# Patient Record
Sex: Male | Born: 1935 | Race: White | Hispanic: No | Marital: Married | Smoking: Never smoker
Health system: Southern US, Community
[De-identification: ages and names within clinical notes are randomized; demographics above are authoritative.]

## PROBLEM LIST (undated history)

## (undated) DIAGNOSIS — I10 Essential (primary) hypertension: Secondary | ICD-10-CM

## (undated) HISTORY — PX: TONSILLECTOMY: SUR1361

## (undated) HISTORY — PX: APPENDECTOMY: SHX54

---

## 1997-11-27 ENCOUNTER — Ambulatory Visit (HOSPITAL_COMMUNITY): Admission: RE | Admit: 1997-11-27 | Discharge: 1997-11-27 | Payer: Self-pay | Admitting: Orthopedic Surgery

## 2003-01-14 ENCOUNTER — Ambulatory Visit (HOSPITAL_COMMUNITY): Admission: RE | Admit: 2003-01-14 | Discharge: 2003-01-14 | Payer: Self-pay | Admitting: Gastroenterology

## 2004-01-22 ENCOUNTER — Emergency Department (HOSPITAL_COMMUNITY): Admission: EM | Admit: 2004-01-22 | Discharge: 2004-01-22 | Payer: Self-pay | Admitting: Emergency Medicine

## 2015-08-14 DIAGNOSIS — L309 Dermatitis, unspecified: Secondary | ICD-10-CM | POA: Diagnosis not present

## 2015-09-15 DIAGNOSIS — H6122 Impacted cerumen, left ear: Secondary | ICD-10-CM | POA: Diagnosis not present

## 2015-09-15 DIAGNOSIS — H6062 Unspecified chronic otitis externa, left ear: Secondary | ICD-10-CM | POA: Diagnosis not present

## 2015-09-16 DIAGNOSIS — H524 Presbyopia: Secondary | ICD-10-CM | POA: Diagnosis not present

## 2015-09-29 DIAGNOSIS — H6062 Unspecified chronic otitis externa, left ear: Secondary | ICD-10-CM | POA: Diagnosis not present

## 2015-09-29 DIAGNOSIS — H6122 Impacted cerumen, left ear: Secondary | ICD-10-CM | POA: Diagnosis not present

## 2015-10-26 DIAGNOSIS — K5900 Constipation, unspecified: Secondary | ICD-10-CM | POA: Diagnosis not present

## 2015-10-26 DIAGNOSIS — I10 Essential (primary) hypertension: Secondary | ICD-10-CM | POA: Diagnosis not present

## 2016-03-18 DIAGNOSIS — K5900 Constipation, unspecified: Secondary | ICD-10-CM | POA: Diagnosis not present

## 2016-03-25 DIAGNOSIS — Z23 Encounter for immunization: Secondary | ICD-10-CM | POA: Diagnosis not present

## 2016-03-30 DIAGNOSIS — H6121 Impacted cerumen, right ear: Secondary | ICD-10-CM | POA: Diagnosis not present

## 2016-03-30 DIAGNOSIS — H6061 Unspecified chronic otitis externa, right ear: Secondary | ICD-10-CM | POA: Diagnosis not present

## 2016-04-06 DIAGNOSIS — H6061 Unspecified chronic otitis externa, right ear: Secondary | ICD-10-CM | POA: Diagnosis not present

## 2016-04-06 DIAGNOSIS — H6121 Impacted cerumen, right ear: Secondary | ICD-10-CM | POA: Diagnosis not present

## 2016-04-21 DIAGNOSIS — K5900 Constipation, unspecified: Secondary | ICD-10-CM | POA: Diagnosis not present

## 2016-04-27 DIAGNOSIS — Z0000 Encounter for general adult medical examination without abnormal findings: Secondary | ICD-10-CM | POA: Diagnosis not present

## 2016-04-27 DIAGNOSIS — I10 Essential (primary) hypertension: Secondary | ICD-10-CM | POA: Diagnosis not present

## 2016-07-01 DIAGNOSIS — Z23 Encounter for immunization: Secondary | ICD-10-CM | POA: Diagnosis not present

## 2016-10-25 DIAGNOSIS — K5900 Constipation, unspecified: Secondary | ICD-10-CM | POA: Diagnosis not present

## 2016-10-25 DIAGNOSIS — I10 Essential (primary) hypertension: Secondary | ICD-10-CM | POA: Diagnosis not present

## 2016-10-28 DIAGNOSIS — H524 Presbyopia: Secondary | ICD-10-CM | POA: Diagnosis not present

## 2016-12-21 DIAGNOSIS — M25561 Pain in right knee: Secondary | ICD-10-CM | POA: Diagnosis not present

## 2017-03-30 DIAGNOSIS — Z23 Encounter for immunization: Secondary | ICD-10-CM | POA: Diagnosis not present

## 2017-04-05 DIAGNOSIS — H6122 Impacted cerumen, left ear: Secondary | ICD-10-CM | POA: Diagnosis not present

## 2017-05-02 DIAGNOSIS — I10 Essential (primary) hypertension: Secondary | ICD-10-CM | POA: Diagnosis not present

## 2017-05-02 DIAGNOSIS — Z0000 Encounter for general adult medical examination without abnormal findings: Secondary | ICD-10-CM | POA: Diagnosis not present

## 2017-07-03 DIAGNOSIS — Z23 Encounter for immunization: Secondary | ICD-10-CM | POA: Diagnosis not present

## 2017-08-30 ENCOUNTER — Observation Stay (HOSPITAL_COMMUNITY)
Admission: EM | Admit: 2017-08-30 | Discharge: 2017-08-31 | Disposition: A | Discharge status: Home / Self Care | Payer: Medicare Other | Attending: Internal Medicine | Admitting: Internal Medicine

## 2017-08-30 ENCOUNTER — Emergency Department (HOSPITAL_COMMUNITY): Payer: Medicare Other

## 2017-08-30 ENCOUNTER — Encounter (HOSPITAL_COMMUNITY): Payer: Self-pay | Admitting: Emergency Medicine

## 2017-08-30 ENCOUNTER — Other Ambulatory Visit: Payer: Self-pay

## 2017-08-30 DIAGNOSIS — D72829 Elevated white blood cell count, unspecified: Secondary | ICD-10-CM | POA: Insufficient documentation

## 2017-08-30 DIAGNOSIS — Z7982 Long term (current) use of aspirin: Secondary | ICD-10-CM | POA: Diagnosis not present

## 2017-08-30 DIAGNOSIS — I10 Essential (primary) hypertension: Secondary | ICD-10-CM | POA: Diagnosis not present

## 2017-08-30 DIAGNOSIS — J9811 Atelectasis: Secondary | ICD-10-CM | POA: Insufficient documentation

## 2017-08-30 DIAGNOSIS — I2510 Atherosclerotic heart disease of native coronary artery without angina pectoris: Secondary | ICD-10-CM | POA: Insufficient documentation

## 2017-08-30 DIAGNOSIS — Z8249 Family history of ischemic heart disease and other diseases of the circulatory system: Secondary | ICD-10-CM | POA: Diagnosis not present

## 2017-08-30 DIAGNOSIS — R079 Chest pain, unspecified: Secondary | ICD-10-CM | POA: Diagnosis not present

## 2017-08-30 DIAGNOSIS — I493 Ventricular premature depolarization: Secondary | ICD-10-CM | POA: Insufficient documentation

## 2017-08-30 DIAGNOSIS — I082 Rheumatic disorders of both aortic and tricuspid valves: Secondary | ICD-10-CM | POA: Diagnosis not present

## 2017-08-30 DIAGNOSIS — R072 Precordial pain: Secondary | ICD-10-CM | POA: Diagnosis not present

## 2017-08-30 DIAGNOSIS — Z79899 Other long term (current) drug therapy: Secondary | ICD-10-CM | POA: Diagnosis not present

## 2017-08-30 DIAGNOSIS — R5383 Other fatigue: Secondary | ICD-10-CM | POA: Diagnosis not present

## 2017-08-30 DIAGNOSIS — R0789 Other chest pain: Secondary | ICD-10-CM | POA: Diagnosis not present

## 2017-08-30 DIAGNOSIS — R9431 Abnormal electrocardiogram [ECG] [EKG]: Secondary | ICD-10-CM | POA: Insufficient documentation

## 2017-08-30 DIAGNOSIS — I700 Atherosclerosis of aorta: Secondary | ICD-10-CM | POA: Insufficient documentation

## 2017-08-30 HISTORY — DX: Essential (primary) hypertension: I10

## 2017-08-30 LAB — URINALYSIS, ROUTINE W REFLEX MICROSCOPIC
BILIRUBIN URINE: NEGATIVE
Glucose, UA: NEGATIVE mg/dL
Hgb urine dipstick: NEGATIVE
KETONES UR: 5 mg/dL — AB
Leukocytes, UA: NEGATIVE
NITRITE: NEGATIVE
PH: 7 (ref 5.0–8.0)
Protein, ur: NEGATIVE mg/dL
SPECIFIC GRAVITY, URINE: 1.01 (ref 1.005–1.030)

## 2017-08-30 LAB — TROPONIN I

## 2017-08-30 LAB — CBC
HEMATOCRIT: 42.2 % (ref 39.0–52.0)
HEMOGLOBIN: 14.3 g/dL (ref 13.0–17.0)
MCH: 32.2 pg (ref 26.0–34.0)
MCHC: 33.9 g/dL (ref 30.0–36.0)
MCV: 95 fL (ref 78.0–100.0)
Platelets: 226 10*3/uL (ref 150–400)
RBC: 4.44 MIL/uL (ref 4.22–5.81)
RDW: 12.9 % (ref 11.5–15.5)
WBC: 15.6 10*3/uL — AB (ref 4.0–10.5)

## 2017-08-30 LAB — BASIC METABOLIC PANEL
ANION GAP: 11 (ref 5–15)
BUN: 21 mg/dL — ABNORMAL HIGH (ref 6–20)
CO2: 23 mmol/L (ref 22–32)
Calcium: 9.2 mg/dL (ref 8.9–10.3)
Chloride: 103 mmol/L (ref 101–111)
Creatinine, Ser: 0.99 mg/dL (ref 0.61–1.24)
GLUCOSE: 114 mg/dL — AB (ref 65–99)
POTASSIUM: 4.4 mmol/L (ref 3.5–5.1)
SODIUM: 137 mmol/L (ref 135–145)

## 2017-08-30 LAB — I-STAT TROPONIN, ED
TROPONIN I, POC: 0.01 ng/mL (ref 0.00–0.08)
Troponin i, poc: 0 ng/mL (ref 0.00–0.08)

## 2017-08-30 MED ORDER — SODIUM CHLORIDE 0.9 % IV BOLUS
1000.0000 mL | Freq: Once | INTRAVENOUS | Status: AC
  Administered 2017-08-30: 1000 mL via INTRAVENOUS

## 2017-08-30 MED ORDER — ASPIRIN EC 81 MG PO TBEC
81.0000 mg | DELAYED_RELEASE_TABLET | Freq: Every day | ORAL | Status: DC
  Administered 2017-08-31: 81 mg via ORAL
  Filled 2017-08-30: qty 1

## 2017-08-30 MED ORDER — ENOXAPARIN SODIUM 40 MG/0.4ML ~~LOC~~ SOLN
40.0000 mg | Freq: Every day | SUBCUTANEOUS | Status: DC
  Administered 2017-08-30: 40 mg via SUBCUTANEOUS
  Filled 2017-08-30: qty 0.4

## 2017-08-30 MED ORDER — ACETAMINOPHEN 325 MG PO TABS: 650.0000 mg | ORAL_TABLET | ORAL | Status: DC | PRN

## 2017-08-30 MED ORDER — MORPHINE SULFATE (PF) 2 MG/ML IV SOLN: 2.0000 mg | INTRAVENOUS | Status: DC | PRN

## 2017-08-30 MED ORDER — IOPAMIDOL (ISOVUE-370) INJECTION 76%
INTRAVENOUS | Status: AC
  Administered 2017-08-30: 100 mL
  Filled 2017-08-30: qty 100

## 2017-08-30 MED ORDER — MORPHINE SULFATE (PF) 4 MG/ML IV SOLN: 2.0000 mg | INTRAVENOUS | Status: DC | PRN

## 2017-08-30 MED ORDER — ONDANSETRON HCL 4 MG/2ML IJ SOLN: 4.0000 mg | Freq: Four times a day (QID) | INTRAMUSCULAR | Status: DC | PRN

## 2017-08-30 MED ORDER — SODIUM CHLORIDE 0.9 % IV SOLN
100.0000 mg | Freq: Once | INTRAVENOUS | Status: AC
  Administered 2017-08-30: 100 mg via INTRAVENOUS
  Filled 2017-08-30: qty 100

## 2017-08-30 NOTE — ED Provider Notes (Signed)
[facility]-EMERGENCY DEPT Provider Note   CSN: [id number] Arrival date & time: 08/30/17  1432     History   Chief Complaint Chief Complaint  Patient presents with  . Chest Pain    HPI COHL BEHRENS is a 84 y.o. male with a past medical history of hypertension, who presents to ED for evaluation of 9-hour history of sudden onset mid chest "fullness, funny feeling."  Symptoms began after he woke up this morning.  He denies any pain but states that the feeling is worse with deep breathing.  He also feels fatigued "all over."  He is unsure if this is caused by him doing some yard work outside for the past 2 days.  No previous history of similar symptoms in the past.  Denies any shortness of breath, cough, hemoptysis, recent surgeries, recent prolonged travel, leg swelling, history of MI, DVT or PE, fever, sick contacts with similar symptoms, vomiting, abdominal pain, recent hospitalizations.  HPI  Past Medical History:  Diagnosis Date  . Hypertension     There are no active problems to display for this patient.   History reviewed. No pertinent surgical history.      Home Medications    Prior to Admission medications   Medication Sig Start Date End Date Taking? Authorizing Provider  amLODipine (NORVASC) 10 MG tablet Take 10 mg by mouth daily. 03/05/14  Yes [provider]  aspirin 81 MG EC tablet Take 81 mg by mouth daily. Swallow whole.   Yes [provider]    Family History No family history on file.  Social History Social History   Tobacco Use  . Smoking status: Not on file  Substance Use Topics  . Alcohol use: Not on file  . Drug use: Not on file     Allergies   Patient has no known allergies.   Review of Systems Review of Systems  Constitutional: Positive for fatigue. Negative for appetite change, chills and fever.  HENT: Negative for ear pain, rhinorrhea, sneezing and sore throat.   Eyes: Negative for photophobia  and visual disturbance.  Respiratory: Negative for cough, chest tightness, shortness of breath and wheezing.   Cardiovascular: Positive for chest pain. Negative for palpitations.  Gastrointestinal: Negative for abdominal pain, blood in stool, constipation, diarrhea, nausea and vomiting.  Genitourinary: Negative for dysuria, hematuria and urgency.  Musculoskeletal: Negative for myalgias.  Skin: Negative for rash.  Neurological: Negative for dizziness, weakness and light-headedness.     Physical Exam Updated Vital Signs BP (!) 128/59 (BP Location: Right Arm)   Pulse 68   Temp 98.7 F (37.1 C) (Oral)   Resp 15   Ht 5\' 10"  (1.778 m)   Wt 70.3 kg (155 lb)   SpO2 95%   BMI 22.24 kg/m   Physical Exam  Constitutional: He appears well-developed and well-nourished. No distress.  Nontoxic appearing and in no acute distress.   HENT:  Head: Normocephalic and atraumatic.  Nose: Nose normal.  Eyes: Conjunctivae and EOM are normal. Right eye exhibits no discharge. Left eye exhibits no discharge. No scleral icterus.  Neck: Normal range of motion. Neck supple.  Cardiovascular: Normal rate, regular rhythm, normal heart sounds and intact distal pulses. Exam reveals no gallop and no friction rub.  No murmur heard. Pulmonary/Chest: Effort normal and breath sounds normal. No respiratory distress.  No chest tenderness to palpation.  Abdominal: Soft. Bowel sounds are normal. He exhibits no distension. There is no tenderness. There is no guarding.  Musculoskeletal:  Normal range of motion. He exhibits no edema.  No lower extremity edema, erythema or calf tenderness bilaterally.  Neurological: He is alert. He exhibits normal muscle tone. Coordination normal.  Skin: Skin is warm and dry. No rash noted.  Psychiatric: He has a normal mood and affect.  Nursing note and vitals reviewed.    ED Treatments / Results  Labs (all labs ordered are listed, but only abnormal results are displayed) Labs  Reviewed  BASIC METABOLIC PANEL - Abnormal; Notable for the following components:      Result Value   Glucose, Bld 114 (*)    BUN 21 (*)    All other components within normal limits  CBC - Abnormal; Notable for the following components:   WBC 15.6 (*)    All other components within normal limits  I-STAT TROPONIN, ED  I-STAT TROPONIN, ED    EKG EKG Interpretation  Date/Time:  Wednesday August 30 2017 14:50:33 EDT Ventricular Rate:  83 PR Interval:    QRS Duration: 82 QT Interval:  366 QTC Calculation: 430 R Axis:   20 Text Interpretation:  Sinus rhythm no change over time.  Confirmed by Bary Castilla ([id number]) on 08/30/2017 2:54:40 PM   Radiology Dg Chest 2 View  Result Date: 08/30/2017 CLINICAL DATA:  Mid chest full sensation began this morning around 615 associated with bilateral arm fatigue. History of hypertension. Nonsmoker. EXAM: CHEST - 2 VIEW COMPARISON:  None in PACs FINDINGS: The lungs are adequately inflated. There are coarse lung markings at the left lung base and to a lesser extent on the right. The heart and pulmonary vascularity are normal. The mediastinum is normal in width. There is calcification in the wall of the aortic arch. The bony thorax exhibits no acute abnormality. IMPRESSION: Bibasilar atelectasis or pneumonia greatest on the left. Followup PA and lateral chest X-ray is recommended in 3-4 weeks following trial of antibiotic therapy to ensure resolution and exclude underlying malignancy. Electronically Signed   By: David  [place] M.D.   On: 08/30/2017 15:19    Procedures Procedures (including critical care time)  Medications Ordered in ED Medications  sodium chloride 0.9 % bolus 1,000 mL (0 mLs Intravenous Stopped 08/30/17 1956)  doxycycline (VIBRAMYCIN) 100 mg in sodium chloride 0.9 % 250 mL IVPB (0 mg Intravenous Stopped 08/30/17 1956)     Initial Impression / Assessment and Plan / ED Course  I have reviewed the triage vital signs and the nursing  notes.  Pertinent labs & imaging results that were available during my care of the patient were reviewed by me and considered in my medical decision making (see chart for details).  Clinical Course as of Aug 31 2027  Wed Aug 30, 2017  327 84 year old male here with some vague chest and upper abdominal discomfort since waking up this morning.  He denies that it the pain.  States is been working on the yard hard for the last 2 days.  No prior history of coronary disease.  It is associated with some generalized fatigue.  His initial EKG was quite abnormal but on repeat appeared to be less concerning.  His initial troponin is negative.  Chest x-ray was read as atelectasis versus pneumonia.  He denies any cough.  Waiting delta trop.    [MB]    Clinical Course User Index [MB] Terrilee Files, MD    Patient presents to ED for evaluation of 9-hour history of sudden onset mid chest "fullness" that began after he woke up this morning.  No previous history of similar symptoms in the past.  He does have a history of hypertension and reports compliance with his home medications.  He denies history of diabetes, family history of CAD, tobacco use, recent surgeries, recent prolonged travel, history of MI, DVT or PE.  States that the sensation has been constant.  He is afebrile with no use of antipyretics recently and no history of fever.  He was satting at 93-35% on room air with clear breath sounds bilaterally.  He has no lower extremity edema, erythema or calf tenderness.  Troponin negative x2.  CBC with leukocytosis at 15.  BMP unremarkable.  EKG initially showed abnormal changes but repeat appears to be less concerning, per Dr. Charm BargesButler.  Chest x-ray with possible pneumonia.  Patient was given empiric antibiotics.  His saturations dropped to 92% on room air while ambulating.  Patient will need to be admitted for chest pain rule out.  He has a HEART score of 4 which makes him moderate risk.  Spoke to hospitalist  who requests that we order CT angio to evaluate for PE or confirmation of pneumonia.  Appreciate the help of hospitalist for management of this patient. Patient discussed with and seen by my attending physician, Dr. Charm BargesButler.  Portions of this note were generated with Scientist, clinical (histocompatibility and immunogenetics)Dragon dictation software. Dictation errors may occur despite best attempts at proofreading.   Final Clinical Impressions(s) / ED Diagnoses   Final diagnoses:  None    ED Discharge Orders    None       Dietrich PatesKhatri, Tovah Slavick, PA-C 08/30/17 2032    Terrilee FilesButler, Michael C, MD 08/31/17 1153

## 2017-08-30 NOTE — ED Notes (Signed)
Patient transported to CT 

## 2017-08-30 NOTE — H&P (Signed)
Don Howard ZOX:[id number] DOB: [birth year] DOA: 08/30/2017     PCP: [name]   Outpatient Specialists:  NONE   Patient arrived to ER on 08/30/17 at 1432  Patient coming from:  home Lives alone,     Chief Complaint:  Chief Complaint  Patient presents with  . Chest Pain    HPI: Don Howard is a 84 y.o. male with medical history significant of HTN    Presented with chest discomfort of fullness started when he got up this morning at 615 he had described a sensation of fatigue in both arms no otherwise localized neurological findings. Reports no prior hx of the same. His chest discomfort for now has been going on for the past 9 hours he describes it as a funny feeling.  Does not quite feel it is a painful sensation somewhat worse with deep breaths.  Reports being tired all over no fevers or chills he has been doing some yard work for the past 2 days but unsure if this is related.  He had no recent travel no leg edema no known history of coronary artery disease or cancer no prior history of DVTs or PEs.  Denies any sick contacts.  Otherwise no nausea vomiting or diarrhea He does yard work without any chest pains.   Regarding pertinent Chronic problems: History of hypertension for which she takes Norvasc   While in ER:  Was found to have elevated white blood cell count chest x-ray unclear if possible pneumonia but also abnormal EKG initially on repeat was improved initial troponin x2 is been negative he was started on antibiotics  Significant initial  Findings: Abnormal Labs Reviewed  BASIC METABOLIC PANEL - Abnormal; Notable for the following components:      Result Value   Glucose, Bld 114 (*)    BUN 21 (*)    All other components within normal limits  CBC - Abnormal; Notable for the following components:   WBC 15.6 (*)    All other components within normal limits      Na 137 K 4 .4  Cr  Lab Results  Component Value Date   CREATININE  0.99 08/30/2017      HG/HCT     Component Value Date/Time   HGB 14.3 08/30/2017 1502   HCT 42.2 08/30/2017 1502   Troponin (Point of Care Test) Recent Labs    08/30/17 1859  TROPIPOC 0.00      UA   ordered    CXR -bibasilar atelectasis and pneumonia possibly worse in the left  CTA chest -no PE and no PNA    ECG:  Personally reviewed by me showing: HR : 83 Rhythm:  NSR,   Ischemic changes nonspecific changes,  QTC 430  7 EKG shows frequent PVCs      ED Triage Vitals  Enc Vitals Group     BP 08/30/17 1446 (!) 148/71     Pulse Rate 08/30/17 1446 79     Resp 08/30/17 1446 (!) 21     Temp 08/30/17 1446 98.7 F (37.1 C)     Temp Source 08/30/17 1446 Oral     SpO2 08/30/17 1446 95 %     Weight 08/30/17 1447 155 lb (70.3 kg)     Height 08/30/17 1447 5\' 10"  (1.778 m)     Head Circumference --      Peak Flow --      Pain Score 08/30/17 1451 4  Pain Loc --      Pain Edu? --      Excl. in GC? --   TMAX(24)@     on arrival  ED Triage Vitals  Enc Vitals Group     BP 08/30/17 1446 (!) 148/71     Pulse Rate 08/30/17 1446 79     Resp 08/30/17 1446 (!) 21     Temp 08/30/17 1446 98.7 F (37.1 C)     Temp Source 08/30/17 1446 Oral     SpO2 08/30/17 1446 95 %     Weight 08/30/17 1447 155 lb (70.3 kg)     Height 08/30/17 1447 5\' 10"  (1.778 m)     Head Circumference --      Peak Flow --      Pain Score 08/30/17 1451 4     Pain Loc --      Pain Edu? --      Excl. in GC? --      Latest  Blood pressure (!) 128/59, pulse 68, temperature 98.7 F (37.1 C), temperature source Oral, resp. rate 15, height 5\' 10"  (1.778 m), weight 70.3 kg (155 lb), SpO2 95 %.   Following Medications were ordered in ER: Medications  sodium chloride 0.9 % bolus 1,000 mL (0 mLs Intravenous Stopped 08/30/17 1956)  doxycycline (VIBRAMYCIN) 100 mg in sodium chloride 0.9 % 250 mL IVPB (0 mg Intravenous Stopped 08/30/17 1956)     Hospitalist was called for admission for possible pneumonia  versus chest pain evaluation   Review of Systems:    Pertinent positives include: fatigue,  chest discomfort  Constitutional:  No weight loss, night sweats, Fevers, chills, weight loss  HEENT:  No headaches, Difficulty swallowing,Tooth/dental problems,Sore throat,  No sneezing, itching, ear ache, nasal congestion, post nasal drip,  Cardio-vascular:  NoOrthopnea, PND, anasarca, dizziness, palpitations.no Bilateral lower extremity swelling  GI:  No heartburn, indigestion, abdominal pain, nausea, vomiting, diarrhea, change in bowel habits, loss of appetite, melena, blood in stool, hematemesis Resp:  no shortness of breath at rest. No dyspnea on exertion, No excess mucus, no productive cough, No non-productive cough, No coughing up of blood.No change in color of mucus.No wheezing. Skin:  no rash or lesions. No jaundice GU:  no dysuria, change in color of urine, no urgency or frequency. No straining to urinate.  No flank pain.  Musculoskeletal:  No joint pain or no joint swelling. No decreased range of motion. No back pain.  Psych:  No change in mood or affect. No depression or anxiety. No memory loss.  Neuro: no localizing neurological complaints, no tingling, no weakness, no double vision, no gait abnormality, no slurred speech, no confusion  As per HPI otherwise 10 point review of systems negative.   Past Medical History:   Past Medical History:  Diagnosis Date  . Hypertension       History reviewed. No pertinent surgical history.  Social History:  Ambulatory    independently      has no tobacco, alcohol, and drug history on file.     Family History:   Family History  Problem Relation Age of Onset  . Pancreatic cancer Mother   . Hypertension Father   . CAD Neg Hx   . Diabetes Neg Hx     Allergies: No Known Allergies   Prior to Admission medications   Medication Sig Start Date End Date Taking? Authorizing Provider  amLODipine (NORVASC) 10 MG tablet Take  10 mg by mouth daily. 03/05/14  Yes [provider]  aspirin 81 MG EC tablet Take 81 mg by mouth daily. Swallow whole.   Yes [provider]   Physical Exam: Blood pressure (!) 128/59, pulse 68, temperature 98.7 F (37.1 C), temperature source Oral, resp. rate 15, height 5\' 10"  (1.778 m), weight 70.3 kg (155 lb), SpO2 95 %. 1. General:  in No Acute distress  well  -appearing 2. Psychological: Alert and Oriented 3. Head/ENT:    Dry Mucous Membranes                          Head Non traumatic, neck supple                            Poor Dentition 4. SKIN:   decreased Skin turgor,  Skin clean Dry and intact no rash 5. Heart: Regular rate and rhythm no Murmur, no Rub  noted gallop 6. Lungs:  Clear to auscultation bilaterally, no wheezes or crackles   7. Abdomen: Soft,  non-tender, Non distended bowel sounds present 8. Lower extremities: no clubbing, cyanosis, or edema 9. Neurologically Grossly intact, moving all 4 extremities equally   10. MSK: Normal range of motion   LABS:     Recent Labs  Lab 08/30/17 1502  WBC 15.6*  HGB 14.3  HCT 42.2  MCV 95.0  PLT 226   Basic Metabolic Panel: Recent Labs  Lab 08/30/17 1502  NA 137  K 4.4  CL 103  CO2 23  GLUCOSE 114*  BUN 21*  CREATININE 0.99  CALCIUM 9.2      No results for input(s): AST, ALT, ALKPHOS, BILITOT, PROT, ALBUMIN in the last 168 hours. No results for input(s): LIPASE, AMYLASE in the last 168 hours. No results for input(s): AMMONIA in the last 168 hours.    HbA1C: No results for input(s): HGBA1C in the last 72 hours. CBG: No results for input(s): GLUCAP in the last 168 hours.    Urine analysis: No results found for: COLORURINE, APPEARANCEUR, LABSPEC, PHURINE, GLUCOSEU, HGBUR, BILIRUBINUR, KETONESUR, PROTEINUR, UROBILINOGEN, NITRITE, LEUKOCYTESUR     Cultures: No results found for: SDES, SPECREQUEST, CULT, REPTSTATUS   Radiological Exams on Admission: Dg Chest 2 View  Result Date:  08/30/2017 CLINICAL DATA:  Mid chest full sensation began this morning around 615 associated with bilateral arm fatigue. History of hypertension. Nonsmoker. EXAM: CHEST - 2 VIEW COMPARISON:  None in PACs FINDINGS: The lungs are adequately inflated. There are coarse lung markings at the left lung base and to a lesser extent on the right. The heart and pulmonary vascularity are normal. The mediastinum is normal in width. There is calcification in the wall of the aortic arch. The bony thorax exhibits no acute abnormality. IMPRESSION: Bibasilar atelectasis or pneumonia greatest on the left. Followup PA and lateral chest X-ray is recommended in 3-4 weeks following trial of antibiotic therapy to ensure resolution and exclude underlying malignancy. Electronically Signed   By: David  [place] M.D.   On: 08/30/2017 15:19    Chart has been reviewed   Assessment/Plan  84 y.o. male with medical history significant of HTN  Admitted for Chest pain  Present on Admission: . Chest pain - - H=   1  , E= 1 ,A= 2   , R  1  , T 0  ,  for the  Total of 5 therefore will admit for observation and further evaluation ( Risk of MACE: Scores  0-3  of 0.9-1.7%.,  4-6: 12-16.6% , Scores ?7: 50-65% )   -  Other explanation for chest pain could be  musculoskeletal   - monitor on telemetry, cycle cardiac enzymes, obtain serial ECG and  ECHO in AM.   - Daily aspirin -  Further risk stratify with lipid panel, hgA1C, obtain TSH.  Make sure patient is on Aspirin.  We will notify cardiology regarding patient's admission. Further management depends on pending  workup  . Abnormal EKG - order echo monitor on telemetry Leukocytosis unclear etiology no evidence of pneumonia confirmed by CT Will obtain urine to evaluate for evidence of UTI  Other plan as per orders.  DVT prophylaxis:    Lovenox     Code Status:  FULL CODE   as per patient    Family Communication:   Family at  Bedside  plan of care was discussed with GrandSon,    Disposition Plan:      To home once workup is complete and patient is stable                            Consults called: email cardiology   Admission status:   obs   Level of care    tele           I have spent a total of 56 min on this admission   Felicia Bloomquist 08/30/2017, 9:30 PM     [facility]  Pager [phone]   after 2 AM please page floor coverage PA If 7AM-7PM, please contact the day team taking care of the patient  [email or web address]  Password [email or web address]

## 2017-08-30 NOTE — ED Notes (Signed)
ED TO INPATIENT HANDOFF REPORT  Name/Age/Gender Don Howard 84 y.o. male  Code Status    Code Status Orders  (From admission, onward)        Start     Ordered   08/30/17 2223  Full code  Continuous     08/30/17 2222    Code Status History    This patient has a current code status but no historical code status.    Advance Directive Documentation     Most Recent Value  Type of Advance Directive  Healthcare Power of Attorney, Living will  Pre-existing out of facility DNR order (yellow form or pink MOST form)  -  "MOST" Form in Place?  -      Home/SNF/Other Home  Chief Complaint generalized weakness  Level of Care/Admitting Diagnosis ED Disposition    ED Disposition Condition [place]: [place] [100102]  Level of Care: Telemetry [5]  Admit to tele based on following criteria: Monitor for Ischemic changes  Diagnosis: Chest pain [287867]  Admitting Physician: Toy Baker [[id number]]  Attending Physician: Toy Baker [[id number]]  PT Class (Do Not Modify): Observation [104]  PT Acc Code (Do Not Modify): Observation [10022]       Medical History Past Medical History:  Diagnosis Date  . Hypertension     Allergies No Known Allergies  IV Location/Drains/Wounds Patient Lines/Drains/Airways Status   Active Line/Drains/Airways    Name:   Placement date:   Placement time:   Site:   Days:   Peripheral IV 08/30/17 Left Antecubital   08/30/17    1558    Antecubital   less than 1          Labs/Imaging Results for orders placed or performed during the hospital encounter of 08/30/17 (from the past 48 hour(s))  Basic metabolic panel     Status: Abnormal   Collection Time: 08/30/17  3:02 PM  Result Value Ref Range   Sodium 137 135 - 145 mmol/L   Potassium 4.4 3.5 - 5.1 mmol/L   Chloride 103 101 - 111 mmol/L   CO2 23 22 - 32 mmol/L   Glucose, Bld 114 (H) 65 - 99 mg/dL   BUN 21 (H) 6 - 20 mg/dL   Creatinine,  Ser 0.99 0.61 - 1.24 mg/dL   Calcium 9.2 8.9 - 10.3 mg/dL   GFR calc non Af Amer >60 >60 mL/min   GFR calc Af Amer >60 >60 mL/min    Comment: (NOTE) The eGFR has been calculated using the CKD EPI equation. This calculation has not been validated in all clinical situations. eGFR's persistently <60 mL/min signify possible Chronic Kidney Disease.    Anion gap 11 5 - 15    Comment: Performed at [facility], [place] [address], [place], [place] [address]  CBC     Status: Abnormal   Collection Time: 08/30/17  3:02 PM  Result Value Ref Range   WBC 15.6 (H) 4.0 - 10.5 K/uL   RBC 4.44 4.22 - 5.81 MIL/uL   Hemoglobin 14.3 13.0 - 17.0 g/dL   HCT 42.2 39.0 - 52.0 %   MCV 95.0 78.0 - 100.0 fL   MCH 32.2 26.0 - 34.0 pg   MCHC 33.9 30.0 - 36.0 g/dL   RDW 12.9 11.5 - 15.5 %   Platelets 226 150 - 400 K/uL    Comment: Performed at [facility], [place] [address] [place], [place] [address]  I-stat troponin, ED  Status: None   Collection Time: 08/30/17  3:05 PM  Result Value Ref Range   Troponin i, poc 0.01 0.00 - 0.08 ng/mL   Comment 3            Comment: Due to the release kinetics of cTnI, a negative result within the first hours of the onset of symptoms does not rule out myocardial infarction with certainty. If myocardial infarction is still suspected, repeat the test at appropriate intervals.   I-stat troponin, ED     Status: None   Collection Time: 08/30/17  6:59 PM  Result Value Ref Range   Troponin i, poc 0.00 0.00 - 0.08 ng/mL   Comment 3            Comment: Due to the release kinetics of cTnI, a negative result within the first hours of the onset of symptoms does not rule out myocardial infarction with certainty. If myocardial infarction is still suspected, repeat the test at appropriate intervals.   Urinalysis, Routine w reflex microscopic     Status: Abnormal   Collection Time: 08/30/17  9:14 PM  Result Value Ref Range   Color, Urine  STRAW (A) YELLOW   APPearance CLEAR CLEAR   Specific Gravity, Urine 1.010 1.005 - 1.030   pH 7.0 5.0 - 8.0   Glucose, UA NEGATIVE NEGATIVE mg/dL   Hgb urine dipstick NEGATIVE NEGATIVE   Bilirubin Urine NEGATIVE NEGATIVE   Ketones, ur 5 (A) NEGATIVE mg/dL   Protein, ur NEGATIVE NEGATIVE mg/dL   Nitrite NEGATIVE NEGATIVE   Leukocytes, UA NEGATIVE NEGATIVE    Comment: Performed at [facility] [address] [place], [place] [address]  Troponin I (q 6hr x 3)     Status: None   Collection Time: 08/30/17  9:23 PM  Result Value Ref Range   Troponin I <0.03 <0.03 ng/mL    Comment: Performed at [facility], [place] [address] [place], [place] [address]   Dg Chest 2 View  Result Date: 08/30/2017 CLINICAL DATA:  Mid chest full sensation began this morning around 615 associated with bilateral arm fatigue. History of hypertension. Nonsmoker. EXAM: CHEST - 2 VIEW COMPARISON:  None in PACs FINDINGS: The lungs are adequately inflated. There are coarse lung markings at the left lung base and to a lesser extent on the right. The heart and pulmonary vascularity are normal. The mediastinum is normal in width. There is calcification in the wall of the aortic arch. The bony thorax exhibits no acute abnormality. IMPRESSION: Bibasilar atelectasis or pneumonia greatest on the left. Followup PA and lateral chest X-ray is recommended in 3-4 weeks following trial of antibiotic therapy to ensure resolution and exclude underlying malignancy. Electronically Signed   By: David  Martinique M.D.   On: 08/30/2017 15:19   Ct Angio Chest Pe W/cm &/or Wo Cm  Result Date: 08/30/2017 CLINICAL DATA:  84 year old male with chest pain and fatigue today. EXAM: CT ANGIOGRAPHY CHEST WITH CONTRAST TECHNIQUE: Multidetector CT imaging of the chest was performed using the standard protocol during bolus administration of intravenous contrast. Multiplanar CT image reconstructions and MIPs were obtained to  evaluate the vascular anatomy. CONTRAST:  131m ISOVUE-370 IOPAMIDOL (ISOVUE-370) INJECTION 76% COMPARISON:  Chest radiographs 1509 hours today. FINDINGS: Cardiovascular: Good contrast bolus timing in the pulmonary arterial tree. No focal filling defect identified in the pulmonary arteries to suggest acute pulmonary embolism. Calcified coronary artery atherosclerosis. Mild Calcified aortic atherosclerosis. Mild cardiomegaly. No pericardial effusion. Mediastinum/Nodes: Negative.  No lymphadenopathy.  Lungs/Pleura: The major airways are patent. There is confluent bilateral lower lobe atelectasis along the surface of the diaphragm and in the costophrenic angles. No definite pleural effusion. No infectious consolidation or other abnormal pulmonary opacity. Upper Abdomen: Negative visible liver, spleen, pancreas, adrenal glands, kidneys, and bowel in the upper abdomen. Musculoskeletal: No acute osseous abnormality identified. Review of the MIP images confirms the above findings. IMPRESSION: 1.  Negative for acute pulmonary embolus. 2. Bilateral lung base atelectasis but no other pulmonary abnormality. 3. Calcified coronary artery and Aortic Atherosclerosis (ICD10-I70.0) with mild cardiomegaly. Electronically Signed   By: Genevie Ann M.D.   On: 08/30/2017 20:53    Pending Labs Unresulted Labs (From admission, onward)   Start     Ordered   08/31/17 0500  Lipid panel  Tomorrow morning,   R     08/30/17 2222   08/31/17 0500  TSH  Tomorrow morning,   R     08/30/17 2222   08/31/17 0500  Hemoglobin A1c  Tomorrow morning,   R     08/30/17 2222   08/30/17 2034  Troponin I (q 6hr x 3)  Now then every 6 hours,   R     08/30/17 2033      Vitals/Pain Today's Vitals   08/30/17 1930 08/30/17 1956 08/30/17 2212 08/30/17 2301  BP:  (!) 128/59 136/66 (!) 131/93  Pulse: 69 68 75 71  Resp: 16 15 18 16   Temp:      TempSrc:      SpO2: 96% 95% 97% 95%  Weight:      Height:      PainSc:        Isolation  Precautions No active isolations  Medications Medications  aspirin EC tablet 81 mg (has no administration in time range)  enoxaparin (LOVENOX) injection 40 mg (40 mg Subcutaneous Given 08/30/17 2259)  morphine 2 MG/ML injection 2 mg (has no administration in time range)  acetaminophen (TYLENOL) tablet 650 mg (has no administration in time range)  ondansetron (ZOFRAN) injection 4 mg (has no administration in time range)  sodium chloride 0.9 % bolus 1,000 mL (0 mLs Intravenous Stopped 08/30/17 1956)  doxycycline (VIBRAMYCIN) 100 mg in sodium chloride 0.9 % 250 mL IVPB (0 mg Intravenous Stopped 08/30/17 1956)  iopamidol (ISOVUE-370) 76 % injection (100 mLs  Contrast Given 08/30/17 2037)    Mobility walks

## 2017-08-30 NOTE — ED Notes (Signed)
Pt SP02 didn't drop below 92% while ambulating. Started at 95% and stayed throughout until last 3 feet out of the 4525ft we walked. Pt was a little stumbly, and states "he just doesn't feel right"

## 2017-08-30 NOTE — ED Triage Notes (Signed)
Pt verbalizes when he stood up this morning around 0615 noted fullness in mid chest. Pt verbalizes ongoing since with fatigue in bilateral arms. Hand grips equal and strong. No unilateral weakness in any extremities.

## 2017-08-31 ENCOUNTER — Other Ambulatory Visit: Payer: Self-pay

## 2017-08-31 ENCOUNTER — Observation Stay (HOSPITAL_BASED_OUTPATIENT_CLINIC_OR_DEPARTMENT_OTHER): Payer: Medicare Other

## 2017-08-31 ENCOUNTER — Other Ambulatory Visit (HOSPITAL_COMMUNITY): Payer: Medicare Other

## 2017-08-31 DIAGNOSIS — I10 Essential (primary) hypertension: Secondary | ICD-10-CM | POA: Diagnosis not present

## 2017-08-31 DIAGNOSIS — I361 Nonrheumatic tricuspid (valve) insufficiency: Secondary | ICD-10-CM | POA: Diagnosis not present

## 2017-08-31 DIAGNOSIS — R079 Chest pain, unspecified: Secondary | ICD-10-CM

## 2017-08-31 DIAGNOSIS — R072 Precordial pain: Secondary | ICD-10-CM | POA: Diagnosis not present

## 2017-08-31 DIAGNOSIS — R9431 Abnormal electrocardiogram [ECG] [EKG]: Secondary | ICD-10-CM | POA: Diagnosis not present

## 2017-08-31 DIAGNOSIS — D72829 Elevated white blood cell count, unspecified: Secondary | ICD-10-CM | POA: Diagnosis not present

## 2017-08-31 LAB — ECHOCARDIOGRAM COMPLETE
Height: 70 in
Weight: 2480 oz

## 2017-08-31 LAB — LIPID PANEL
CHOL/HDL RATIO: 2 ratio
CHOLESTEROL: 110 mg/dL (ref 0–200)
HDL: 55 mg/dL (ref >40)
LDL Cholesterol: 49 mg/dL (ref 0–99)
Triglycerides: 29 mg/dL (ref <150)
VLDL: 6 mg/dL (ref 0–40)

## 2017-08-31 LAB — HEMOGLOBIN A1C
Hgb A1c MFr Bld: 5.1 % (ref 4.8–5.6)
Mean Plasma Glucose: 99.67 mg/dL

## 2017-08-31 LAB — TSH: TSH: 2.495 u[IU]/mL (ref 0.350–4.500)

## 2017-08-31 LAB — TROPONIN I
Troponin I: <0.03 ng/mL (ref <0.03)
Troponin I: <0.03 ng/mL (ref <0.03)

## 2017-08-31 MED ORDER — KETOROLAC TROMETHAMINE 15 MG/ML IJ SOLN
15.0000 mg | Freq: Four times a day (QID) | INTRAMUSCULAR | Status: DC | PRN
  Administered 2017-08-31: 15 mg via INTRAVENOUS
  Filled 2017-08-31: qty 1

## 2017-08-31 MED ORDER — MORPHINE SULFATE (PF) 4 MG/ML IV SOLN: 2.0000 mg | INTRAVENOUS | Status: DC | PRN

## 2017-08-31 MED ORDER — KETOROLAC TROMETHAMINE 10 MG PO TABS: 10.0000 mg | ORAL_TABLET | Freq: Four times a day (QID) | ORAL | 0 refills | Status: DC | PRN

## 2017-08-31 NOTE — Consult Note (Signed)
CARDIOLOGY CONSULT NOTE       Patient ID: Don Howard MRN: [id number] DOB/AGE: Apr 28, 1927 84 y.o.  Admit date: 08/30/2017 Referring Physician: Rhona Leavens Primary Physician: [name] Primary Cardiologist: New/Jaymen Fetch Reason for Consultation: Chest Pain  Active Problems:   Chest pain   Abnormal EKG   Leukocytosis   HPI:  84 y.o. failry active for age. History of HTN. No history of vascular disease or CAD. Awoke yesterday with vague feeling in chest Not pain not pressure Just a funny feeling has not had this before He was doing yard work the last 2-3 days raking leaves and cleaning gutters. This am still has pain Thinks its positional better laying flat worse rolling on left side. Not pleuritic. In ER Troponin negative. ECG normal except for PVCls CTA negative for PE. Compliant with BP meds. No recent falls or trauma No recent URI, fever cough or phlegm   ROS All other systems reviewed and negative except as noted above  Past Medical History:  Diagnosis Date  . Hypertension     Family History  Problem Relation Age of Onset  . Pancreatic cancer Mother   . Hypertension Father   . CAD Neg Hx   . Diabetes Neg Hx     Social History   Socioeconomic History  . Marital status: Married    Spouse name: Not on file  . Number of children: Not on file  . Years of education: Not on file  . Highest education level: Not on file  Occupational History  . Not on file  Social Needs  . Financial resource strain: Not on file  . Food insecurity:    Worry: Not on file    Inability: Not on file  . Transportation needs:    Medical: Not on file    Non-medical: Not on file  Tobacco Use  . Smoking status: Never Smoker  . Smokeless tobacco: Never Used  Substance and Sexual Activity  . Alcohol use: Never    Frequency: Never  . Drug use: Never  . Sexual activity: Not on file  Lifestyle  . Physical activity:    Days per week: Not on file    Minutes per session: Not  on file  . Stress: Not on file  Relationships  . Social connections:    Talks on phone: Not on file    Gets together: Not on file    Attends religious service: Not on file    Active member of club or organization: Not on file    Attends meetings of clubs or organizations: Not on file    Relationship status: Not on file  . Intimate partner violence:    Fear of current or ex partner: Not on file    Emotionally abused: Not on file    Physically abused: Not on file    Forced sexual activity: Not on file  Other Topics Concern  . Not on file  Social History Narrative  . Not on file    History reviewed. No pertinent surgical history.   Marland Kitchen aspirin EC  81 mg Oral Daily  . enoxaparin (LOVENOX) injection  40 mg Subcutaneous QHS     Physical Exam: Blood pressure 124/68, pulse 67, temperature 98.9 F (37.2 C), temperature source Oral, resp. rate 18, height 5\' 10"  (1.778 m), weight 155 lb (70.3 kg), SpO2 95 %.    Affect appropriate Spry elderly white male  HEENT: normal Neck supple with no adenopathy JVP normal no bruits no thyromegaly  Lungs clear with no wheezing and good diaphragmatic motion Heart:  S1/S2 no murmur, no rub, gallop or click PMI normal Abdomen: benighn, BS positve, no tenderness, no AAA no bruit.  No HSM or HJR Distal pulses intact with no bruits No edema Neuro non-focal Skin warm and dry No muscular weakness   Labs:   Lab Results  Component Value Date   WBC 15.6 (H) 08/30/2017   HGB 14.3 08/30/2017   HCT 42.2 08/30/2017   MCV 95.0 08/30/2017   PLT 226 08/30/2017    Recent Labs  Lab 08/30/17 1502  NA 137  K 4.4  CL 103  CO2 23  BUN 21*  CREATININE 0.99  CALCIUM 9.2  GLUCOSE 114*   Lab Results  Component Value Date   TROPONINI <0.03 08/31/2017    Lab Results  Component Value Date   CHOL 110 08/31/2017   Lab Results  Component Value Date   HDL 55 08/31/2017   Lab Results  Component Value Date   LDLCALC 49 08/31/2017   Lab Results    Component Value Date   TRIG 29 08/31/2017   Lab Results  Component Value Date   CHOLHDL 2.0 08/31/2017   No results found for: LDLDIRECT    Radiology: Dg Chest 2 View  Result Date: 08/30/2017 CLINICAL DATA:  Mid chest full sensation began this morning around 615 associated with bilateral arm fatigue. History of hypertension. Nonsmoker. EXAM: CHEST - 2 VIEW COMPARISON:  None in PACs FINDINGS: The lungs are adequately inflated. There are coarse lung markings at the left lung base and to a lesser extent on the right. The heart and pulmonary vascularity are normal. The mediastinum is normal in width. There is calcification in the wall of the aortic arch. The bony thorax exhibits no acute abnormality. IMPRESSION: Bibasilar atelectasis or pneumonia greatest on the left. Followup PA and lateral chest X-ray is recommended in 3-4 weeks following trial of antibiotic therapy to ensure resolution and exclude underlying malignancy. Electronically Signed   By: David  Swaziland M.D.   On: 08/30/2017 15:19   Ct Angio Chest Pe W/cm &/or Wo Cm  Result Date: 08/30/2017 CLINICAL DATA:  84 year old male with chest pain and fatigue today. EXAM: CT ANGIOGRAPHY CHEST WITH CONTRAST TECHNIQUE: Multidetector CT imaging of the chest was performed using the standard protocol during bolus administration of intravenous contrast. Multiplanar CT image reconstructions and MIPs were obtained to evaluate the vascular anatomy. CONTRAST:  ISOVUE-370 IOPAMIDOL (ISOVUE-370) INJECTION 76% COMPARISON:  Chest radiographs 1509 hours today. FINDINGS: Cardiovascular: Good contrast bolus timing in the pulmonary arterial tree. No focal filling defect identified in the pulmonary arteries to suggest acute pulmonary embolism. Calcified coronary artery atherosclerosis. Mild Calcified aortic atherosclerosis. Mild cardiomegaly. No pericardial effusion. Mediastinum/Nodes: Negative.  No lymphadenopathy. Lungs/Pleura: The major airways are patent.  There is confluent bilateral lower lobe atelectasis along the surface of the diaphragm and in the costophrenic angles. No definite pleural effusion. No infectious consolidation or other abnormal pulmonary opacity. Upper Abdomen: Negative visible liver, spleen, pancreas, adrenal glands, kidneys, and bowel in the upper abdomen. Musculoskeletal: No acute osseous abnormality identified. Review of the MIP images confirms the above findings. IMPRESSION: 1.  Negative for acute pulmonary embolus. 2. Bilateral lung base atelectasis but no other pulmonary abnormality. 3. Calcified coronary artery and Aortic Atherosclerosis (ICD10-I70.0) with mild cardiomegaly. Electronically Signed   By: Odessa Fleming M.D.   On: 08/30/2017 20:53    EKG  NSR normal except for PVC   ASSESSMENT AND  PLAN:  Chest pain : atypical Rx with ASA/Tylenol for now Echo to r/o pericardial effusion and assess EF If echo normal can probably be d/c for outpatient myovue. Persistent pain despite negative troponin and positional nature suggest non cardiac etiology  HTN:  Well controlled.  Continue current medications and low sodium Dash type diet.    PVC:  Asymptomatic not frequent echo will help put arrhythmia in context with EF    Signed: Charlton HawsPeter Alyra Patty 08/31/2017, 7:56 AM

## 2017-08-31 NOTE — Discharge Summary (Signed)
Physician Discharge Summary  Don Howard:[id number] DOB: 1927-03-02 DOA: 08/30/2017  PCP: [name]  Admit date: 08/30/2017 Discharge date: 08/31/2017  Admitted From: Home Disposition:  Home  Recommendations for Outpatient Follow-up:  1. Follow up with PCP in 2-3 weeks 2. Recommend outpatient myovue  Discharge Condition:Improved CODE STATUS:Full Diet recommendation: Heart healthy   Brief/Interim Summary: See admit H&P for details.  Briefly patient is a very pleasant 84 year old male with history of hypertension presenting with substernal chest discomfort that began on the morning of hospital visit.  Patient was placed in observation status for further workup.  . Chest pain   -Pain reproducible on exam, appears to be positional and reproduced with twisting motion -Suspect muscular skeletal etiology of chest pain -Serial troponin negative x3 -She had been continued on aspirin per home regimen -Seen by cardiology and underwent 2D echocardiogram which is found to have normal LVEF and no wall motion abnormality.  Cardiology recommendations for outpatient Myoview study -We will provide limited course of Toradol on discharge.  Of note, patient reported much clinical improvement following 1 dose of IV Toradol  . Abnormal EKG  -Outpatient Myoview study as per cardiology recommendations  Leukocytosis unclear etiology -No infectious process identified.  Patient remained afebrile -Close outpatient follow-up  Discharge Diagnoses:  Active Problems:   Chest pain   Abnormal EKG   Leukocytosis    Discharge Instructions   Allergies as of 08/31/2017   No Known Allergies     Medication List    TAKE these medications   amLODipine 10 MG tablet Commonly known as:  NORVASC Take 10 mg by mouth daily.   aspirin 81 MG EC tablet Take 81 mg by mouth daily. Swallow whole.   ketorolac 10 MG tablet Commonly known as:  TORADOL Take 1 tablet (10 mg total) by  mouth every 6 (six) hours as needed for severe pain.      Follow-up Information    [facility]. Schedule an appointment as soon as possible for a visit in 2 week(s).   Specialty:  Family Medicine Contact information: [address] [place] [address] [phone]          No Known Allergies  Consultations:  Cardiology  Procedures/Studies: Dg Chest 2 View  Result Date: 08/30/2017 CLINICAL DATA:  Mid chest full sensation began this morning around 615 associated with bilateral arm fatigue. History of hypertension. Nonsmoker. EXAM: CHEST - 2 VIEW COMPARISON:  None in PACs FINDINGS: The lungs are adequately inflated. There are coarse lung markings at the left lung base and to a lesser extent on the right. The heart and pulmonary vascularity are normal. The mediastinum is normal in width. There is calcification in the wall of the aortic arch. The bony thorax exhibits no acute abnormality. IMPRESSION: Bibasilar atelectasis or pneumonia greatest on the left. Followup PA and lateral chest X-ray is recommended in 3-4 weeks following trial of antibiotic therapy to ensure resolution and exclude underlying malignancy. Electronically Signed   By: David  Swaziland M.D.   On: 08/30/2017 15:19   Ct Angio Chest Pe W/cm &/or Wo Cm  Result Date: 08/30/2017 CLINICAL DATA:  84 year old male with chest pain and fatigue today. EXAM: CT ANGIOGRAPHY CHEST WITH CONTRAST TECHNIQUE: Multidetector CT imaging of the chest was performed using the standard protocol during bolus administration of intravenous contrast. Multiplanar CT image reconstructions and MIPs were obtained to evaluate the vascular anatomy. CONTRAST:  ISOVUE-370 IOPAMIDOL (ISOVUE-370) INJECTION 76%  COMPARISON:  Chest radiographs 1509 hours today. FINDINGS: Cardiovascular: Good contrast bolus timing in the pulmonary arterial tree. No focal filling defect identified in the pulmonary arteries to suggest  acute pulmonary embolism. Calcified coronary artery atherosclerosis. Mild Calcified aortic atherosclerosis. Mild cardiomegaly. No pericardial effusion. Mediastinum/Nodes: Negative.  No lymphadenopathy. Lungs/Pleura: The major airways are patent. There is confluent bilateral lower lobe atelectasis along the surface of the diaphragm and in the costophrenic angles. No definite pleural effusion. No infectious consolidation or other abnormal pulmonary opacity. Upper Abdomen: Negative visible liver, spleen, pancreas, adrenal glands, kidneys, and bowel in the upper abdomen. Musculoskeletal: No acute osseous abnormality identified. Review of the MIP images confirms the above findings. IMPRESSION: 1.  Negative for acute pulmonary embolus. 2. Bilateral lung base atelectasis but no other pulmonary abnormality. 3. Calcified coronary artery and Aortic Atherosclerosis (ICD10-I70.0) with mild cardiomegaly. Electronically Signed   By: Odessa FlemingH  Hall M.D.   On: 08/30/2017 20:53     Subjective: Eager to go home  Discharge Exam: Vitals:   08/30/17 2348 08/31/17 0439  BP: (!) 153/61 124/68  Pulse: 65 67  Resp: 18 18  Temp: 97.9 F (36.6 C) 98.9 F (37.2 C)  SpO2: 95% 95%   Vitals:   08/30/17 2212 08/30/17 2301 08/30/17 2348 08/31/17 0439  BP: 136/66 (!) 131/93 (!) 153/61 124/68  Pulse: 75 71 65 67  Resp: 18 16 18 18   Temp:   97.9 F (36.6 C) 98.9 F (37.2 C)  TempSrc:   Oral Oral  SpO2: 97% 95% 95% 95%  Weight:      Height:        General: Pt is alert, awake, not in acute distress Cardiovascular: RRR, S1/S2 +, no rubs, no gallops Respiratory: CTA bilaterally, no wheezing, no rhonchi Abdominal: Soft, NT, ND, bowel sounds + Extremities: no edema, no cyanosis   The results of significant diagnostics from this hospitalization (including imaging, microbiology, ancillary and laboratory) are listed below for reference.     Microbiology: No results found for this or any previous visit (from the past 240  hour(s)).   Labs: BNP (last 3 results) No results for input(s): BNP in the last 8760 hours. Basic Metabolic Panel: Recent Labs  Lab 08/30/17 1502  NA 137  K 4.4  CL 103  CO2 23  GLUCOSE 114*  BUN 21*  CREATININE 0.99  CALCIUM 9.2   Liver Function Tests: No results for input(s): AST, ALT, ALKPHOS, BILITOT, PROT, ALBUMIN in the last 168 hours. No results for input(s): LIPASE, AMYLASE in the last 168 hours. No results for input(s): AMMONIA in the last 168 hours. CBC: Recent Labs  Lab 08/30/17 1502  WBC 15.6*  HGB 14.3  HCT 42.2  MCV 95.0  PLT 226   Cardiac Enzymes: Recent Labs  Lab 08/30/17 2123 08/31/17 0207 08/31/17 0812  TROPONINI <0.03 <0.03 <0.03   BNP: Invalid input(s): POCBNP CBG: No results for input(s): GLUCAP in the last 168 hours. D-Dimer No results for input(s): DDIMER in the last 72 hours. Hgb A1c Recent Labs    08/31/17 0207  HGBA1C 5.1   Lipid Profile Recent Labs    08/31/17 0207  CHOL 110  HDL 55  LDLCALC 49  TRIG 29  CHOLHDL 2.0   Thyroid function studies Recent Labs    08/31/17 0207  TSH 2.495   Anemia work up No results for input(s): VITAMINB12, FOLATE, FERRITIN, TIBC, IRON, RETICCTPCT in the last 72 hours. Urinalysis    Component Value Date/Time   COLORURINE STRAW (  A) 08/30/2017 2114   APPEARANCEUR CLEAR 08/30/2017 2114   LABSPEC 1.010 08/30/2017 2114   PHURINE 7.0 08/30/2017 2114   GLUCOSEU NEGATIVE 08/30/2017 2114   HGBUR NEGATIVE 08/30/2017 2114   BILIRUBINUR NEGATIVE 08/30/2017 2114   KETONESUR 5 (A) 08/30/2017 2114   PROTEINUR NEGATIVE 08/30/2017 2114   NITRITE NEGATIVE 08/30/2017 2114   LEUKOCYTESUR NEGATIVE 08/30/2017 2114   Sepsis Labs Invalid input(s): PROCALCITONIN,  WBC,  LACTICIDVEN Microbiology No results found for this or any previous visit (from the past 240 hour(s)).   SIGNED:   Rickey Barbara, MD  [facility] 08/31/2017, 2:49 PM  If 7PM-7AM, please contact  night-coverage [email or web address] Password [email or web address]

## 2017-08-31 NOTE — Progress Notes (Signed)
  Echocardiogram 2D Echocardiogram has been performed.  [name], Wilfrido Luedke R 08/31/2017, 9:07 AM

## 2017-08-31 NOTE — Progress Notes (Signed)
Went over all discharge papers with patient and family.  All questions answered.  VSS.  Prescriptions and AVS given to patient.  Wheeled out via NT.

## 2017-09-05 ENCOUNTER — Telehealth: Payer: Self-pay

## 2017-09-05 DIAGNOSIS — R079 Chest pain, unspecified: Secondary | ICD-10-CM

## 2017-09-05 NOTE — Telephone Encounter (Signed)
Per Dr. Eden EmmsNishan, patient can have lexiscan myoview before his appointment on 09/21/17.  Left message for patient to call back.

## 2017-09-05 NOTE — Telephone Encounter (Signed)
-----   Message from Berle MullNorma J Miller sent at 09/04/2017  3:43 PM EDT ----- Regarding: FW: new pt eval needed   ----- Message ----- From: Dewain Penningrent, Patricia H Sent: 09/01/2017   1:58 PM To: [facility] Scheduling Subject: new pt eval needed                             Pt needs a new pt appt before having a stress test. Please arrange.  ----- Message ----- From: Jerald Kiefhiu, Stephen K, MD Sent: 08/31/2017   2:53 PM To: Dewain PenningPatricia H Trent Subject: Need outpatient myoview couple weeks           Hi there,  Dr. Eden EmmsNishan recommneded an outpt myoview I imagine within the next several weeks. Could you help with this?  Thanks! -Luellen PuckerSteve Chiu

## 2017-09-06 NOTE — Telephone Encounter (Signed)
Left second message for patient to call back.

## 2017-09-07 ENCOUNTER — Telehealth (HOSPITAL_COMMUNITY): Payer: Self-pay | Admitting: Cardiovascular Disease

## 2017-09-07 NOTE — Telephone Encounter (Addendum)
Informed patient of instructions below. Will send message to scheduling to call patient to schedule.    Your physician has requested that you have a lexiscan myoview. For further information please visit [email or web address].   How to Prepare for Your Myoview Test:  A. Nothing to eat or drink 3 hours prior to arrival time, except you may drink water B. No Caffeine/Decaffeinated products or chocolate 12 hours prior to arrival. C. No Cologne or Lotion D. Wear comfortable walking shoes E. Total time is 3 to 4 hours; you may want to bring reading material for the waiting time. If someone comes with you, they will need to remain in the lobby due to limited space in the testing area.  F. [address] for your test.   After Lehman BrothersYou Arrive:  Once you arrive in the Nuclear Cardiology lab, an IV will be started, then the Technologist will inject a small amount of radioactive tracer. There will be a 1 hour waiting period after this injection. A series of pictures will be taken of your heart following this waiting period. You will be prepped for the stress portion of the test. During the stress portion of your test a small amount of radioactive tracer will be injected in your IV. After the stress portion, there is a short rest period during which time your heart and blood pressure will be monitored. After the short test period the Technologist will begin your second set of pictures. Your doctor will inform you of your test results within 7 days.  In preparation for your appointment, medication, and supplies will be purchased. Appointment availability is limited, so if you need to cancel or reschedule please call the Nuclear Department at [phone], 24 hours in advance to avoid a cancellation fee of $50.00

## 2017-09-07 NOTE — Telephone Encounter (Signed)
Follow up ° °Pt returning call for nurse °

## 2017-09-12 NOTE — Telephone Encounter (Signed)
User: [name], [name] A Date/time: 09/07/17 2:59 PM  Comment: Called pt and lmsg for him to CB to get scheduled for a [email or web address]  Context:  Outcome: Left Message  Phone number: [phone] Phone Type: Home Phone  Comm. type: Telephone Call type: Outgoing  Contact: Don Howard, Kalman N Relation to patient: Self

## 2017-09-14 NOTE — Telephone Encounter (Signed)
Patient cancelled his office visit and never schedule his stress test.

## 2017-09-15 DIAGNOSIS — R0789 Other chest pain: Secondary | ICD-10-CM | POA: Diagnosis not present

## 2017-09-15 DIAGNOSIS — D72829 Elevated white blood cell count, unspecified: Secondary | ICD-10-CM | POA: Diagnosis not present

## 2017-09-15 DIAGNOSIS — Z09 Encounter for follow-up examination after completed treatment for conditions other than malignant neoplasm: Secondary | ICD-10-CM | POA: Diagnosis not present

## 2017-09-21 ENCOUNTER — Ambulatory Visit: Payer: Medicare Other | Admitting: Cardiovascular Disease

## 2018-01-18 DIAGNOSIS — H5213 Myopia, bilateral: Secondary | ICD-10-CM | POA: Diagnosis not present

## 2018-03-09 DIAGNOSIS — Z23 Encounter for immunization: Secondary | ICD-10-CM | POA: Diagnosis not present

## 2018-04-10 DIAGNOSIS — H6123 Impacted cerumen, bilateral: Secondary | ICD-10-CM | POA: Diagnosis not present

## 2018-04-10 DIAGNOSIS — H6062 Unspecified chronic otitis externa, left ear: Secondary | ICD-10-CM | POA: Diagnosis not present

## 2018-05-07 DIAGNOSIS — Z0000 Encounter for general adult medical examination without abnormal findings: Secondary | ICD-10-CM | POA: Diagnosis not present

## 2018-05-07 DIAGNOSIS — I10 Essential (primary) hypertension: Secondary | ICD-10-CM | POA: Diagnosis not present

## 2018-05-07 DIAGNOSIS — M25669 Stiffness of unspecified knee, not elsewhere classified: Secondary | ICD-10-CM | POA: Diagnosis not present

## 2018-05-07 DIAGNOSIS — K5900 Constipation, unspecified: Secondary | ICD-10-CM | POA: Diagnosis not present

## 2018-07-10 DIAGNOSIS — H6122 Impacted cerumen, left ear: Secondary | ICD-10-CM | POA: Diagnosis not present

## 2018-08-17 DIAGNOSIS — R300 Dysuria: Secondary | ICD-10-CM | POA: Diagnosis not present

## 2018-08-17 DIAGNOSIS — N3000 Acute cystitis without hematuria: Secondary | ICD-10-CM | POA: Diagnosis not present

## 2018-09-17 DIAGNOSIS — R3914 Feeling of incomplete bladder emptying: Secondary | ICD-10-CM | POA: Diagnosis not present

## 2018-09-17 DIAGNOSIS — R300 Dysuria: Secondary | ICD-10-CM | POA: Diagnosis not present

## 2018-11-06 DIAGNOSIS — I10 Essential (primary) hypertension: Secondary | ICD-10-CM | POA: Diagnosis not present

## 2018-11-06 DIAGNOSIS — K5900 Constipation, unspecified: Secondary | ICD-10-CM | POA: Diagnosis not present

## 2018-12-11 DIAGNOSIS — N3000 Acute cystitis without hematuria: Secondary | ICD-10-CM | POA: Diagnosis not present

## 2019-01-30 DIAGNOSIS — H5213 Myopia, bilateral: Secondary | ICD-10-CM | POA: Diagnosis not present

## 2019-04-05 DIAGNOSIS — Z23 Encounter for immunization: Secondary | ICD-10-CM | POA: Diagnosis not present

## 2019-04-19 DIAGNOSIS — W57XXXA Bitten or stung by nonvenomous insect and other nonvenomous arthropods, initial encounter: Secondary | ICD-10-CM | POA: Diagnosis not present

## 2019-04-19 DIAGNOSIS — S20361A Insect bite (nonvenomous) of right front wall of thorax, initial encounter: Secondary | ICD-10-CM | POA: Diagnosis not present

## 2019-04-19 DIAGNOSIS — L089 Local infection of the skin and subcutaneous tissue, unspecified: Secondary | ICD-10-CM | POA: Diagnosis not present

## 2019-04-25 DIAGNOSIS — W57XXXA Bitten or stung by nonvenomous insect and other nonvenomous arthropods, initial encounter: Secondary | ICD-10-CM | POA: Diagnosis not present

## 2019-04-25 DIAGNOSIS — L089 Local infection of the skin and subcutaneous tissue, unspecified: Secondary | ICD-10-CM | POA: Diagnosis not present

## 2019-04-25 DIAGNOSIS — S20369D Insect bite (nonvenomous) of unspecified front wall of thorax, subsequent encounter: Secondary | ICD-10-CM | POA: Diagnosis not present

## 2019-06-05 DIAGNOSIS — Z0000 Encounter for general adult medical examination without abnormal findings: Secondary | ICD-10-CM | POA: Diagnosis not present

## 2019-06-05 DIAGNOSIS — I10 Essential (primary) hypertension: Secondary | ICD-10-CM | POA: Diagnosis not present

## 2019-06-05 DIAGNOSIS — M25561 Pain in right knee: Secondary | ICD-10-CM | POA: Diagnosis not present

## 2019-06-05 DIAGNOSIS — K5900 Constipation, unspecified: Secondary | ICD-10-CM | POA: Diagnosis not present

## 2019-06-27 DIAGNOSIS — I10 Essential (primary) hypertension: Secondary | ICD-10-CM | POA: Diagnosis not present

## 2019-06-27 DIAGNOSIS — Z0000 Encounter for general adult medical examination without abnormal findings: Secondary | ICD-10-CM | POA: Diagnosis not present

## 2019-09-03 DIAGNOSIS — L309 Dermatitis, unspecified: Secondary | ICD-10-CM | POA: Diagnosis not present

## 2019-12-03 DIAGNOSIS — R42 Dizziness and giddiness: Secondary | ICD-10-CM | POA: Diagnosis not present

## 2019-12-03 DIAGNOSIS — I10 Essential (primary) hypertension: Secondary | ICD-10-CM | POA: Diagnosis not present

## 2019-12-03 DIAGNOSIS — R5383 Other fatigue: Secondary | ICD-10-CM | POA: Diagnosis not present

## 2019-12-03 DIAGNOSIS — R001 Bradycardia, unspecified: Secondary | ICD-10-CM | POA: Diagnosis not present

## 2020-01-16 NOTE — Progress Notes (Signed)
CARDIOLOGY CONSULT NOTE       Patient ID: Don Howard MRN: [id number] DOB/AGE: Sep 09, 1926 86 y.o.  Admit date: (Not on file) Referring Physician: Kateri Plummer Primary Physician: Farris Has, MD Primary Cardiologist: New Reason for Consultation: Bradycardia  Active Problems:   * No active hospital problems. *   HPI:  86 y.o. referred by Dr Kateri Plummer for bradycardia. Last seen in hospital for atypical chest pain March 2019 History of HTN Pulse at the time was in the high 60's low 70's ECG with NSR PVC normal intervals and no AV block  Echo 08/31/17 with EF 60-65% AV sclerosis mild AR BP Rx with Norvasc No history of syncope renal failure hypothyroidism or vascular disease Did not f/u for outpatient myovue in 2019 to further assess his chest pain   Has had some postural symptoms after sitting for prolonged periods. Gets fatigue 4-5 hours into day Has had some Weight lossIn office 12/03/19 BP 142/62 pulse recorded at 31-42 ?? I suspect he had pulse deficit from his PVC;s ECG showed NSR rate 63 with ventricular tri geminny   TSH 3.39 Cr 1.1 Hct 40.6 K 4.3   He has some fatigue but no palpitations, chest pain syncope or dyspnea   He is widowed for 11 years Only son died of melanoma Has grand kids that look in on him Still driving and walks well   ROS All other systems reviewed and negative except as noted above  Past Medical History:  Diagnosis Date  . Hypertension     Family History  Problem Relation Age of Onset  . Pancreatic cancer Mother   . Hypertension Father   . CAD Neg Hx   . Diabetes Neg Hx     Social History   Socioeconomic History  . Marital status: Married    Spouse name: Not on file  . Number of children: Not on file  . Years of education: Not on file  . Highest education level: Not on file  Occupational History  . Not on file  Tobacco Use  . Smoking status: Never Smoker  . Smokeless tobacco: Never Used  Vaping Use  . Vaping Use: Never used  Substance and  Sexual Activity  . Alcohol use: Never  . Drug use: Never  . Sexual activity: Not on file  Other Topics Concern  . Not on file  Social History Narrative  . Not on file   Social Determinants of Health   Financial Resource Strain:   . Difficulty of Paying Living Expenses: Not on file  Food Insecurity:   . Worried About Programme researcher, broadcasting/film/video in the Last Year: Not on file  . Ran Out of Food in the Last Year: Not on file  Transportation Needs:   . Lack of Transportation (Medical): Not on file  . Lack of Transportation (Non-Medical): Not on file  Physical Activity:   . Days of Exercise per Week: Not on file  . Minutes of Exercise per Session: Not on file  Stress:   . Feeling of Stress : Not on file  Social Connections:   . Frequency of Communication with Friends and Family: Not on file  . Frequency of Social Gatherings with Friends and Family: Not on file  . Attends Religious Services: Not on file  . Active Member of Clubs or Organizations: Not on file  . Attends Banker Meetings: Not on file  . Marital Status: Not on file  Intimate Partner Violence:   . Fear of Current  or Ex-Partner: Not on file  . Emotionally Abused: Not on file  . Physically Abused: Not on file  . Sexually Abused: Not on file    No past surgical history on file.    Current Outpatient Medications:  .  amLODipine (NORVASC) 10 MG tablet, Take 10 mg by mouth daily., Disp: , Rfl:  .  aspirin 81 MG EC tablet, Take 81 mg by mouth daily. Swallow whole., Disp: , Rfl:  .  ketorolac (TORADOL) 10 MG tablet, Take 1 tablet (10 mg total) by mouth every 6 (six) hours as needed for severe pain. (Patient not taking: Reported on 09/07/2017), Disp: 20 tablet, Rfl: 0    Physical Exam: There were no vitals taken for this visit.    Affect appropriate Healthy:  appears stated age HEENT: normal Neck supple with no adenopathy JVP normal no bruits no thyromegaly Lungs clear with no wheezing and good diaphragmatic  motion Heart:  S1/S2 no murmur, no rub, gallop or click PMI normal Abdomen: benighn, BS positve, no tenderness, no AAA no bruit.  No HSM or HJR Distal pulses intact with no bruits No edema Neuro non-focal Skin warm and dry No muscular weakness   Labs:   Lab Results  Component Value Date   WBC 15.6 (H) 08/30/2017   HGB 14.3 08/30/2017   HCT 42.2 08/30/2017   MCV 95.0 08/30/2017   PLT 226 08/30/2017   No results for input(s): NA, K, CL, CO2, BUN, CREATININE, CALCIUM, PROT, BILITOT, ALKPHOS, ALT, AST, GLUCOSE in the last 168 hours.  Invalid input(s): LABALBU Lab Results  Component Value Date   TROPONINI <0.03 08/31/2017    Lab Results  Component Value Date   CHOL 110 08/31/2017   Lab Results  Component Value Date   HDL 55 08/31/2017   Lab Results  Component Value Date   LDLCALC 49 08/31/2017   Lab Results  Component Value Date   TRIG 29 08/31/2017   Lab Results  Component Value Date   CHOLHDL 2.0 08/31/2017   No results found for: LDLDIRECT    Radiology: No results found.  EKG: SR rate 63 ventricular trigeminy    ASSESSMENT AND PLAN:   1. 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  HTN:  Well controlled.  Continue current medications and low sodium Dash type diet.   3. PVC;s asymptomatic will check 48 hr holter to quantify and echo to r/o DCM or structural heart disease   Echo  Holter F/U with [place] post testing   Signed: Charlton Haws 01/23/2020, 9:25 AM

## 2020-01-23 ENCOUNTER — Ambulatory Visit (INDEPENDENT_AMBULATORY_CARE_PROVIDER_SITE_OTHER): Payer: Medicare Other | Admitting: Cardiovascular Disease

## 2020-01-23 ENCOUNTER — Encounter: Payer: Self-pay | Admitting: *Deleted

## 2020-01-23 ENCOUNTER — Encounter: Payer: Self-pay | Admitting: Cardiovascular Disease

## 2020-01-23 ENCOUNTER — Other Ambulatory Visit: Payer: Self-pay

## 2020-01-23 VITALS — BP 110/64 | HR 59 | Ht 70.0 in | Wt 152.0 lb

## 2020-01-23 DIAGNOSIS — I493 Ventricular premature depolarization: Secondary | ICD-10-CM | POA: Diagnosis not present

## 2020-01-23 DIAGNOSIS — R001 Bradycardia, unspecified: Secondary | ICD-10-CM

## 2020-01-23 NOTE — Patient Instructions (Signed)
Medication Instructions:  *If you need a refill on your cardiac medications before your next appointment, please call your pharmacy*   Lab Work: If you have labs (blood work) drawn today and your tests are completely normal, you will receive your results only by: Marland Kitchen MyChart Message (if you have MyChart) OR . A paper copy in the mail If you have any lab test that is abnormal or we need to change your treatment, we will call you to review the results.   Testing/Procedures: Your physician has requested that you have an echocardiogram. Echocardiography is a painless test that uses sound waves to create images of your heart. It provides your doctor with information about the size and shape of your heart and how well your heart's chambers and valves are working. This procedure takes approximately one hour. There are no restrictions for this procedure.  ZIO XT- Long Term Monitor Instructions   Your physician has requested you wear your ZIO patch monitor___3____days.   This is a single patch monitor.  [employer] supplies one patch monitor per enrollment.  Additional stickers are not available.   Please do not apply patch if you will be having a Nuclear Stress Test, Echocardiogram, Cardiac CT, MRI, or Chest Xray during the time frame you would be wearing the monitor. The patch cannot be worn during these tests.  You cannot remove and re-apply the ZIO XT patch monitor.   Your ZIO patch monitor will be sent USPS Priority mail from [facility] directly to your home address. The monitor may also be mailed to a PO BOX if home delivery is not available.   It may take 3-5 days to receive your monitor after you have been enrolled.   Once you have received you monitor, please review enclosed instructions.  Your monitor has already been registered assigning a specific monitor serial # to you.   Applying the monitor   Shave hair from upper left chest.   Hold abrader disc by orange tab.  Rub abrader in  40 strokes over left upper chest as indicated in your monitor instructions.   Clean area with 4 enclosed alcohol pads .  Use all pads to assure are is cleaned thoroughly.  Let dry.   Apply patch as indicated in monitor instructions.  Patch will be place under collarbone on left side of chest with arrow pointing upward.   Rub patch adhesive wings for 2 minutes.Remove white label marked "1".  Remove white label marked "2".  Rub patch adhesive wings for 2 additional minutes.   While looking in a mirror, press and release button in center of patch.  A small green light will flash 3-4 times .  This will be your only indicator the monitor has been turned on.     Do not shower for the first 24 hours.  You may shower after the first 24 hours.   Press button if you feel a symptom. You will hear a small click.  Record Date, Time and Symptom in the Patient Log Book.   When you are ready to remove patch, follow instructions on last 2 pages of Patient Log Book.  Stick patch monitor onto last page of Patient Log Book.   Place Patient Log Book in [place] box.  Use locking tab on box and tape box closed securely.  The [employer] and [employer] has [employer] on it.  Please place in mailbox as soon as possible.  Your physician should have your test results approximately 7 days  after the monitor has been mailed back to [place].   Call [facility] Customer Care at [phone] if you have questions regarding your ZIO XT patch monitor.  Call them immediately if you see an orange light blinking on your monitor.   If your monitor falls off in less than 4 days contact our Monitor department at [phone].  If your monitor becomes loose or falls off after 4 days call [employer] at [phone] for suggestions on securing your monitor.     Follow-Up: At [facility], you and your health needs are our priority.  As part of our continuing mission to provide you with exceptional heart care, we have created  designated Provider Care Teams.  These Care Teams include your primary Cardiologist (physician) and Advanced Practice Providers (APPs -  Physician Assistants and Nurse Practitioners) who all work together to provide you with the care you need, when you need it.  We recommend signing up for the patient portal called "MyChart".  Sign up information is provided on this After Visit Summary.  MyChart is used to connect with patients for Virtual Visits (Telemedicine).  Patients are able to view lab/test results, encounter notes, upcoming appointments, etc.  Non-urgent messages can be sent to your provider as well.   To learn more about what you can do with MyChart, go to [email or web address].    Your next appointment:   Follow up after test are complete  The format for your next appointment:   In Person  Provider:   You may see Dr. Eden Emms or one of the following Advanced Practice Providers on your designated Care Team:    Norma Fredrickson, NP  Nada Boozer, NP  Georgie Chard, NP

## 2020-01-23 NOTE — Progress Notes (Signed)
Patient ID: Don Howard, male   DOB: Aug 18, 1926, 86 y.o.   MRN: [id number] Patient enrolled for [employer] to ship a 3 day ZIO XT long term holter monitor to his home.

## 2020-01-27 ENCOUNTER — Ambulatory Visit (INDEPENDENT_AMBULATORY_CARE_PROVIDER_SITE_OTHER): Payer: Medicare Other

## 2020-01-27 DIAGNOSIS — R001 Bradycardia, unspecified: Secondary | ICD-10-CM | POA: Diagnosis not present

## 2020-01-27 DIAGNOSIS — I493 Ventricular premature depolarization: Secondary | ICD-10-CM | POA: Diagnosis not present

## 2020-02-05 ENCOUNTER — Ambulatory Visit (HOSPITAL_COMMUNITY): Payer: Medicare Other | Attending: Cardiovascular Disease

## 2020-02-05 ENCOUNTER — Other Ambulatory Visit: Payer: Self-pay

## 2020-02-05 DIAGNOSIS — I493 Ventricular premature depolarization: Secondary | ICD-10-CM | POA: Diagnosis not present

## 2020-02-05 DIAGNOSIS — R001 Bradycardia, unspecified: Secondary | ICD-10-CM | POA: Diagnosis not present

## 2020-02-05 LAB — ECHOCARDIOGRAM COMPLETE
Area-P 1/2: 1.35 cm2
P 1/2 time: 725 ms
S' Lateral: 3.1 cm

## 2020-02-10 DIAGNOSIS — R001 Bradycardia, unspecified: Secondary | ICD-10-CM | POA: Diagnosis not present

## 2020-02-10 DIAGNOSIS — I493 Ventricular premature depolarization: Secondary | ICD-10-CM | POA: Diagnosis not present

## 2020-02-21 NOTE — Progress Notes (Signed)
Cardiology Office Note   Date:  02/27/2020   ID:  Don Howard, DOB 04/06/1927, MRN [id number]  PCP:  Farris Has, MD  Cardiologist:  Dr. Eden Emms, MD  Chief Complaint  Patient presents with   Follow-up    History of Present Illness: Don Howard is a 86 y.o. male who presents for follow up, seen for Dr. Eden Emms.   Don Howard has a hx of HTN, bradycardia, and PVCs who was last seen by Dr. Eden Emms 01/23/20 for follow up.   He was seen in the hospital 08/2017 for atypical chest pain at which time EKG showed NSR with PVCs and no AV block . Echo with EF at 60-65% with AV sclerosis, mild AR. He was set up for OP Myoview however this was never performed.   At last follow up he was fatigued however no other symptoms. Dr. Eden Emms placed a 48H monitor that showed no arrhthymias. Echo 02/05/20 was normal with EF at 55-60% with no RWMA, severely elevated pulmonary systolic pressure and moderate aortic valve sclerosis/calcification   Today Don Howard is doing very well from a CV standpoint.  He reports that his fatigue is now gone.  He is a very active 86 year old.  He states he was previously getting very tired around 10 AM with minimal activity and feels that this was related to summer temperatures.  He now states he has had no further fatigue and is able to do all of his activities around his yard without complication.  He has no shortness of breath, LE edema, PND, dizziness, fatigue or syncope.  We reviewed echocardiogram results which do show elevated right ventricular systolic pressures however given that he is asymptomatic we will make no change today.   Past Medical History:  Diagnosis Date   Hypertension     No past surgical history on file.   Current Outpatient Medications  Medication Sig Dispense Refill   amLODipine (NORVASC) 10 MG tablet Take 10 mg by mouth daily.     aspirin 81 MG EC tablet Take 81 mg by mouth daily. Swallow whole.     ketorolac (TORADOL) 10 MG tablet Take  1 tablet (10 mg total) by mouth every 6 (six) hours as needed for severe pain. (Patient not taking: Reported on 02/27/2020) 20 tablet 0   No current facility-administered medications for this visit.    Allergies:   Patient has no known allergies.    Social History:  The patient  reports that he has never smoked. He has never used smokeless tobacco. He reports that he does not drink alcohol and does not use drugs.   Family History:  The patient's family history includes Hypertension in his father; Pancreatic cancer in his mother.    ROS:  Please see the history of present illness. Otherwise, review of systems are positive for none.   All other systems are reviewed and negative.    PHYSICAL EXAM: VS:  BP (!) 138/58    Pulse 64    Ht 5\' 10"  (1.778 m)    Wt 154 lb 3.2 oz (69.9 kg)    SpO2 96%    BMI 22.13 kg/m  , BMI Body mass index is 22.13 kg/m.   General: Well developed, well nourished, NAD Lungs:Clear to ausculation bilaterally. No wheezes, rales, or rhonchi. Breathing is unlabored. Cardiovascular: RRR with S1 S2. No murmurs Extremities: No edema.  Neuro: Alert and oriented. No focal deficits. No facial asymmetry. MAE spontaneously. Psych: Responds to questions appropriately with  normal affect.     EKG:  EKG is not ordered today.  Recent Labs: No results found for requested labs within last 8760 hours.    Lipid Panel    Component Value Date/Time   CHOL 110 08/31/2017 0207   TRIG 29 08/31/2017 0207   HDL 55 08/31/2017 0207   CHOLHDL 2.0 08/31/2017 0207   VLDL 6 08/31/2017 0207   LDLCALC 49 08/31/2017 0207      Wt Readings from Last 3 Encounters:  02/27/20 154 lb 3.2 oz (69.9 kg)  01/23/20 152 lb (68.9 kg)  08/30/17 155 lb (70.3 kg)     Other studies Reviewed: Additional studies/ records that were reviewed today include: See below  Review of the above records demonstrates:   48h monitor 02/11/20:  NSR range 40-152 bpm average 61 bpm One 4 beats run NSVT One 8  beat run atrial tachycardia No PAF PAC < 1% beats PVC;s < 1% beats No significant arrhythmias   Echo 02/05/20:  1. Left ventricular ejection fraction, by estimation, is 55 to 60%. The  left ventricle has normal function. The left ventricle has no regional  wall motion abnormalities. Left ventricular diastolic parameters are  consistent with age-related delayed  relaxation (normal).  2. Right ventricular systolic function is normal. The right ventricular  size is normal. There is severely elevated pulmonary artery systolic  pressure.  3. Left atrial size was moderately dilated.  4. The mitral valve is normal in structure. Trivial mitral valve  regurgitation. No evidence of mitral stenosis.  5. The aortic valve is tricuspid. Aortic valve regurgitation is mild.  Mild to moderate aortic valve sclerosis/calcification is present, without  any evidence of aortic stenosis.  6. The inferior vena cava is normal in size with greater than 50%  respiratory variability, suggesting right atrial pressure of 3 mmHg.    ASSESSMENT AND PLAN:  1. Bradycardia: -Felt to be pulse deficit from PVCs with no real bradycardia  -No indication for PPM  -Stable rates today, monitor with no significant bradycardia -Denies dizziness, syncope  2. HTN: -Stable, 138/58 -Continue current regimen  3. PVCs: -Asymptomatic  -48H monitor with no significant arrhthymias   4. Pulmonary HTN: -Prior echocardiogram with no complaints of shortness of breath, PND -Will follow with serial echocardiograms  Current medicines are reviewed at length with the patient today.  The patient does not have concerns regarding medicines.  The following changes have been made:  no change  Labs/ tests ordered today include: None  No orders of the defined types were placed in this encounter.   Disposition:   FU with myself of Dr. Eden Emms in 6 months  Signed, Georgie Chard, NP  02/27/2020 10:11 AM    [facility] [address] [place], [place], [place]  [address] Phone: [phone]; Fax: [phone]

## 2020-02-27 ENCOUNTER — Ambulatory Visit (INDEPENDENT_AMBULATORY_CARE_PROVIDER_SITE_OTHER): Payer: Medicare Other | Admitting: Cardiology

## 2020-02-27 ENCOUNTER — Encounter: Payer: Self-pay | Admitting: Cardiology

## 2020-02-27 ENCOUNTER — Other Ambulatory Visit: Payer: Self-pay

## 2020-02-27 VITALS — BP 138/58 | HR 64 | Ht 70.0 in | Wt 154.2 lb

## 2020-02-27 DIAGNOSIS — I493 Ventricular premature depolarization: Secondary | ICD-10-CM

## 2020-02-27 DIAGNOSIS — R001 Bradycardia, unspecified: Secondary | ICD-10-CM

## 2020-02-27 NOTE — Patient Instructions (Signed)
Medication Instructions:  Your physician recommends that you continue on your current medications as directed. Please refer to the Current Medication list given to you today.  *If you need a refill on your cardiac medications before your next appointment, please call your pharmacy*   Lab Work: None  If you have labs (blood work) drawn today and your tests are completely normal, you will receive your results only by: Marland Kitchen MyChart Message (if you have MyChart) OR . A paper copy in the mail If you have any lab test that is abnormal or we need to change your treatment, we will call you to review the results.   Testing/Procedures: None   Follow-Up: At [facility], you and your health needs are our priority.  As part of our continuing mission to provide you with exceptional heart care, we have created designated Provider Care Teams.  These Care Teams include your primary Cardiologist (physician) and Advanced Practice Providers (APPs -  Physician Assistants and Nurse Practitioners) who all work together to provide you with the care you need, when you need it.  We recommend signing up for the patient portal called "MyChart".  Sign up information is provided on this After Visit Summary.  MyChart is used to connect with patients for Virtual Visits (Telemedicine).  Patients are able to view lab/test results, encounter notes, upcoming appointments, etc.  Non-urgent messages can be sent to your provider as well.   To learn more about what you can do with MyChart, go to [email or web address].    Your next appointment:   6 month(s)  The format for your next appointment:   In Person

## 2020-03-03 DIAGNOSIS — H5213 Myopia, bilateral: Secondary | ICD-10-CM | POA: Diagnosis not present

## 2020-03-13 DIAGNOSIS — Z23 Encounter for immunization: Secondary | ICD-10-CM | POA: Diagnosis not present

## 2020-06-26 DIAGNOSIS — Z0000 Encounter for general adult medical examination without abnormal findings: Secondary | ICD-10-CM | POA: Diagnosis not present

## 2020-06-26 DIAGNOSIS — I10 Essential (primary) hypertension: Secondary | ICD-10-CM | POA: Diagnosis not present

## 2020-06-26 DIAGNOSIS — K5900 Constipation, unspecified: Secondary | ICD-10-CM | POA: Diagnosis not present

## 2020-06-26 DIAGNOSIS — H6120 Impacted cerumen, unspecified ear: Secondary | ICD-10-CM | POA: Diagnosis not present

## 2020-07-07 DIAGNOSIS — H6123 Impacted cerumen, bilateral: Secondary | ICD-10-CM | POA: Diagnosis not present

## 2020-07-07 DIAGNOSIS — H9193 Unspecified hearing loss, bilateral: Secondary | ICD-10-CM | POA: Insufficient documentation

## 2020-11-24 NOTE — Progress Notes (Signed)
Cardiology Office Note   Date:  11/30/2020   ID:  Don Howard, DOB [birth year], MRN [id number]  PCP:  Don Has, MD  Cardiologist:  Dr. Eden Emms, MD  Chief Complaint  Patient presents with   Follow-up    History of Present Illness: Don Howard is a 87 y.o. male who presents for follow up, seen for Dr. Eden Howard.   Don Howard Howard a hx of HTN, bradycardia, and PVCs who was last seen by Dr. Eden Howard 01/23/20 for follow up.   He was seen in the hospital 08/2017 for atypical chest pain at which time EKG showed NSR with PVCs and no AV block . Echo with EF at 60-65% with AV sclerosis, mild AR. He was set up for OP Myoview however this was never performed.   At last follow up he was fatigued however no other symptoms. Dr. Eden Howard placed a 48H monitor that showed no arrhthymias. Echo 02/05/20 was normal with EF at 55-60% with no RWMA, severely elevated pulmonary systolic pressure and moderate aortic valve sclerosis/calcification    He was last seen by myself 02/27/2020 in follow up. At that time he reported that his fatigue was improved. Echocardiogram showed elevated right ventricular systolic pressures however Howard remained aymptomatic.   Today he is doing very well. He continues to stay very active with yard work and gardening daily. Staying safe from the heat. Follows closely with PCP>>>Howard appointment next week. Wants to get his hearing checked for possible hearing aids which seems to be his biggest challeng right now. Denies chest pain, fatigue, SOB, LE edema, orthopnea, dizzinees, presyncopal or syncope.    Past Medical History:  Diagnosis Date   Hypertension     No past surgical history on file.   Current Outpatient Medications  Medication Sig Dispense Refill   amLODipine (NORVASC) 10 MG tablet Take 10 mg by mouth daily.     aspirin 81 MG EC tablet Take 81 mg by mouth daily. Swallow whole.     No current facility-administered medications for this visit.    Allergies:   Patient  Howard no known allergies.    Social History:  The patient  reports that he Howard never smoked. He Howard never used smokeless tobacco. He reports that he does not drink alcohol and does not use drugs.   Family History:  The patient's family history includes Hypertension in his father; Pancreatic cancer in his mother.    ROS:  Please see the history of present illness. Otherwise, review of systems are positive for none.   All other systems are reviewed and negative.    PHYSICAL EXAM: VS:  BP 140/60 (BP Location: Left Arm, Patient Position: Sitting, Cuff Size: Normal)   Pulse (!) 52   Ht 5\' 10"  (1.778 m)   Wt 141 lb (64 kg)   SpO2 97%   BMI 20.23 kg/m  , BMI Body mass index is 20.23 kg/m.  General: Well developed, well nourished, NAD Neck: Negative for carotid bruits. No JVD Lungs:Clear to ausculation bilaterally. No wheezes, rales, or rhonchi. Breathing is unlabored. Cardiovascular: RRR with S1 S2. No murmurs Extremities: No edema. Neuro: Alert and oriented. No focal deficits. No facial asymmetry. MAE spontaneously. Psych: Responds to questions appropriately with normal affect.    EKG:  EKG is not ordered today.  Recent Labs: No results found for requested labs within last 8760 hours.    Lipid Panel    Component Value Date/Time   CHOL 110 08/31/2017 0207  TRIG 29 08/31/2017 0207   HDL 55 08/31/2017 0207   CHOLHDL 2.0 08/31/2017 0207   VLDL 6 08/31/2017 0207   LDLCALC 49 08/31/2017 0207    Wt Readings from Last 3 Encounters:  11/30/20 141 lb (64 kg)  02/27/20 154 lb 3.2 oz (69.9 kg)  01/23/20 152 lb (68.9 kg)    Other studies Reviewed: Additional studies/ records that were reviewed today include.   48h monitor 02/11/20:   NSR range 40-152 bpm average 61 bpm One 4 beats run NSVT One 8 beat run atrial tachycardia No PAF PAC < 1% beats PVC;s < 1% beats No significant arrhythmias    Echo 02/05/20:   1. Left ventricular ejection fraction, by estimation, is 55 to 60%.  The  left ventricle Howard normal function. The left ventricle Howard no regional  wall motion abnormalities. Left ventricular diastolic parameters are  consistent with age-related delayed  relaxation (normal).   2. Right ventricular systolic function is normal. The right ventricular  size is normal. There is severely elevated pulmonary artery systolic  pressure.   3. Left atrial size was moderately dilated.   4. The mitral valve is normal in structure. Trivial mitral valve  regurgitation. No evidence of mitral stenosis.   5. The aortic valve is tricuspid. Aortic valve regurgitation is mild.  Mild to moderate aortic valve sclerosis/calcification is present, without  any evidence of aortic stenosis.   6. The inferior vena cava is normal in size with greater than 50%  respiratory variability, suggesting right atrial pressure of 3 mmHg.  ASSESSMENT AND PLAN:  1. Bradycardia: -HR today, 52bpm -No indication for PPM  -Stable rates on prior monitor with no significant bradycardia -Denies dizziness, syncope   2. HTN: -Stable, 140/60 -Continue current regimen with amlodipine 10   3. PVCs: -Asymptomatic, denies palpitations  -48H monitor with no significant arrhthymias   4. Pulmonary HTN: -Found on prior echocardiogram with no complaints of shortness of breath, PND -Will follow with serial echocardiograms  Current medicines are reviewed at length with the patient today.  The patient does not have concerns regarding medicines.  The following changes have been made:  no change  Labs/ tests ordered today include: None>>to follow with PCP next week for labs  No orders of the defined types were placed in this encounter.    Disposition:   FU with Dr. Eden Howard in 6 months  Signed, Don Chard, NP  11/30/2020 9:13 AM    [facility] [address] [place], [place], [place]  [address] Phone: [phone]; Fax: [phone]

## 2020-11-30 ENCOUNTER — Other Ambulatory Visit: Payer: Self-pay

## 2020-11-30 ENCOUNTER — Ambulatory Visit: Payer: Medicare Other | Admitting: Cardiology

## 2020-11-30 ENCOUNTER — Encounter: Payer: Self-pay | Admitting: Cardiology

## 2020-11-30 VITALS — BP 140/60 | HR 52 | Ht 70.0 in | Wt 141.0 lb

## 2020-11-30 DIAGNOSIS — I493 Ventricular premature depolarization: Secondary | ICD-10-CM

## 2020-11-30 DIAGNOSIS — I10 Essential (primary) hypertension: Secondary | ICD-10-CM

## 2020-11-30 DIAGNOSIS — R001 Bradycardia, unspecified: Secondary | ICD-10-CM | POA: Diagnosis not present

## 2020-11-30 NOTE — Patient Instructions (Signed)
Medication Instructions:  Your physician recommends that you continue on your current medications as directed. Please refer to the Current Medication list given to you today.  *If you need a refill on your cardiac medications before your next appointment, please call your pharmacy*   Lab Work: NONE If you have labs (blood work) drawn today and your tests are completely normal, you will receive your results only by: MyChart Message (if you have MyChart) OR A paper copy in the mail If you have any lab test that is abnormal or we need to change your treatment, we will call you to review the results.   Testing/Procedures: NONE   Follow-Up: At [facility], you and your health needs are our priority.  As part of our continuing mission to provide you with exceptional heart care, we have created designated Provider Care Teams.  These Care Teams include your primary Cardiologist (physician) and Advanced Practice Providers (APPs -  Physician Assistants and Nurse Practitioners) who all work together to provide you with the care you need, when you need it.  We recommend signing up for the patient portal called "MyChart".  Sign up information is provided on this After Visit Summary.  MyChart is used to connect with patients for Virtual Visits (Telemedicine).  Patients are able to view lab/test results, encounter notes, upcoming appointments, etc.  Non-urgent messages can be sent to your provider as well.   To learn more about what you can do with MyChart, go to [email or web address].    Your next appointment:   6 month(s)  The format for your next appointment:   In Person  Provider:   Peter Nishan, MD {   

## 2020-12-24 DIAGNOSIS — I10 Essential (primary) hypertension: Secondary | ICD-10-CM | POA: Diagnosis not present

## 2020-12-24 DIAGNOSIS — K5900 Constipation, unspecified: Secondary | ICD-10-CM | POA: Diagnosis not present

## 2020-12-24 DIAGNOSIS — D649 Anemia, unspecified: Secondary | ICD-10-CM | POA: Diagnosis not present

## 2020-12-24 DIAGNOSIS — R634 Abnormal weight loss: Secondary | ICD-10-CM | POA: Diagnosis not present

## 2020-12-28 ENCOUNTER — Other Ambulatory Visit (HOSPITAL_BASED_OUTPATIENT_CLINIC_OR_DEPARTMENT_OTHER): Payer: Self-pay | Admitting: Family Medicine

## 2020-12-28 DIAGNOSIS — R634 Abnormal weight loss: Secondary | ICD-10-CM

## 2021-01-20 ENCOUNTER — Ambulatory Visit (HOSPITAL_BASED_OUTPATIENT_CLINIC_OR_DEPARTMENT_OTHER)
Admission: RE | Admit: 2021-01-20 | Discharge: 2021-01-20 | Disposition: A | Discharge status: Home / Self Care | Payer: Medicare Other | Source: Ambulatory Visit | Attending: Family Medicine | Admitting: Family Medicine

## 2021-01-20 ENCOUNTER — Other Ambulatory Visit: Payer: Self-pay

## 2021-01-20 ENCOUNTER — Encounter (HOSPITAL_BASED_OUTPATIENT_CLINIC_OR_DEPARTMENT_OTHER): Payer: Self-pay

## 2021-01-20 DIAGNOSIS — R634 Abnormal weight loss: Secondary | ICD-10-CM | POA: Diagnosis not present

## 2021-01-20 DIAGNOSIS — K5900 Constipation, unspecified: Secondary | ICD-10-CM | POA: Diagnosis not present

## 2021-01-20 DIAGNOSIS — K6389 Other specified diseases of intestine: Secondary | ICD-10-CM | POA: Diagnosis not present

## 2021-01-20 DIAGNOSIS — I700 Atherosclerosis of aorta: Secondary | ICD-10-CM | POA: Diagnosis not present

## 2021-01-20 MED ORDER — IOHEXOL 350 MG/ML SOLN
60.0000 mL | Freq: Once | INTRAVENOUS | Status: AC | PRN
  Administered 2021-01-20: 60 mL via INTRAVENOUS

## 2021-03-04 ENCOUNTER — Emergency Department (HOSPITAL_BASED_OUTPATIENT_CLINIC_OR_DEPARTMENT_OTHER): Payer: Medicare Other

## 2021-03-04 ENCOUNTER — Emergency Department (HOSPITAL_BASED_OUTPATIENT_CLINIC_OR_DEPARTMENT_OTHER)
Admission: EM | Admit: 2021-03-04 | Discharge: 2021-03-04 | Disposition: A | Discharge status: Home / Self Care | Payer: Medicare Other | Attending: Emergency Medicine | Admitting: Emergency Medicine

## 2021-03-04 ENCOUNTER — Encounter (HOSPITAL_BASED_OUTPATIENT_CLINIC_OR_DEPARTMENT_OTHER): Payer: Self-pay

## 2021-03-04 ENCOUNTER — Other Ambulatory Visit: Payer: Self-pay

## 2021-03-04 DIAGNOSIS — Z79899 Other long term (current) drug therapy: Secondary | ICD-10-CM | POA: Insufficient documentation

## 2021-03-04 DIAGNOSIS — R1032 Left lower quadrant pain: Secondary | ICD-10-CM | POA: Diagnosis present

## 2021-03-04 DIAGNOSIS — K5900 Constipation, unspecified: Secondary | ICD-10-CM | POA: Diagnosis not present

## 2021-03-04 DIAGNOSIS — I10 Essential (primary) hypertension: Secondary | ICD-10-CM | POA: Insufficient documentation

## 2021-03-04 DIAGNOSIS — Z7982 Long term (current) use of aspirin: Secondary | ICD-10-CM | POA: Diagnosis not present

## 2021-03-04 DIAGNOSIS — R109 Unspecified abdominal pain: Secondary | ICD-10-CM | POA: Diagnosis not present

## 2021-03-04 LAB — CBC WITH DIFFERENTIAL/PLATELET
Abs Immature Granulocytes: 0.02 10*3/uL (ref 0.00–0.07)
Basophils Absolute: 0 10*3/uL (ref 0.0–0.1)
Basophils Relative: 1 %
Eosinophils Absolute: 0.1 10*3/uL (ref 0.0–0.5)
Eosinophils Relative: 1 %
HCT: 40 % (ref 39.0–52.0)
Hemoglobin: 13.5 g/dL (ref 13.0–17.0)
Immature Granulocytes: 0 %
Lymphocytes Relative: 33 %
Lymphs Abs: 2.9 10*3/uL (ref 0.7–4.0)
MCH: 31.8 pg (ref 26.0–34.0)
MCHC: 33.8 g/dL (ref 30.0–36.0)
MCV: 94.3 fL (ref 80.0–100.0)
Monocytes Absolute: 0.7 10*3/uL (ref 0.1–1.0)
Monocytes Relative: 8 %
Neutro Abs: 5.1 10*3/uL (ref 1.7–7.7)
Neutrophils Relative %: 57 %
Platelets: 268 10*3/uL (ref 150–400)
RBC: 4.24 MIL/uL (ref 4.22–5.81)
RDW: 12.6 % (ref 11.5–15.5)
WBC: 8.8 10*3/uL (ref 4.0–10.5)
nRBC: 0 % (ref 0.0–0.2)

## 2021-03-04 LAB — URINALYSIS, ROUTINE W REFLEX MICROSCOPIC
Bilirubin Urine: NEGATIVE
Glucose, UA: NEGATIVE mg/dL
Hgb urine dipstick: NEGATIVE
Leukocytes,Ua: NEGATIVE
Nitrite: NEGATIVE
Protein, ur: NEGATIVE mg/dL
Specific Gravity, Urine: 1.011 (ref 1.005–1.030)
pH: 5.5 (ref 5.0–8.0)

## 2021-03-04 LAB — COMPREHENSIVE METABOLIC PANEL
ALT: 11 U/L (ref 0–44)
AST: 20 U/L (ref 15–41)
Albumin: 4.3 g/dL (ref 3.5–5.0)
Alkaline Phosphatase: 69 U/L (ref 38–126)
Anion gap: 8 (ref 5–15)
BUN: 18 mg/dL (ref 8–23)
CO2: 25 mmol/L (ref 22–32)
Calcium: 8.9 mg/dL (ref 8.9–10.3)
Chloride: 102 mmol/L (ref 98–111)
Creatinine, Ser: 0.99 mg/dL (ref 0.61–1.24)
GFR, Estimated: >60 mL/min (ref >60)
Glucose, Bld: 123 mg/dL — ABNORMAL HIGH (ref 70–99)
Potassium: 4.3 mmol/L (ref 3.5–5.1)
Sodium: 135 mmol/L (ref 135–145)
Total Bilirubin: 0.6 mg/dL (ref 0.3–1.2)
Total Protein: 7 g/dL (ref 6.5–8.1)

## 2021-03-04 LAB — LIPASE, BLOOD: Lipase: 34 U/L (ref 11–51)

## 2021-03-04 MED ORDER — IOHEXOL 350 MG/ML SOLN
60.0000 mL | Freq: Once | INTRAVENOUS | Status: AC | PRN
  Administered 2021-03-04: 60 mL via INTRAVENOUS

## 2021-03-04 MED ORDER — POLYETHYLENE GLYCOL 3350 17 G PO PACK: 17.0000 g | PACK | Freq: Every day | ORAL | 1 refills | Status: DC | PRN

## 2021-03-04 NOTE — Discharge Instructions (Addendum)
Initially take the MiraLAX 2-4 times a day.  Prepackets will help you measure properly.  Then as needed.  Make an appointment to follow-up with your primary care provider.  Use the MiraLAX until you feel that the constipation has been taking care of.  Today CT scan of the abdomen showed no obstruction.  So no other findings other than some constipation.

## 2021-03-04 NOTE — ED Notes (Signed)
Patient transported to CT 

## 2021-03-04 NOTE — ED Provider Notes (Addendum)
[facility] EMERGENCY DEPT Provider Note   CSN: [id number] Arrival date & time: 03/04/21  1124     History Chief Complaint  Patient presents with   Abdominal Pain    Don Howard is a 87 y.o. male.  Patient brought in by POV with a complaint of left lower quadrant abdominal pain over the past 2 weeks.  Patient is also concerned about constipation.  Last bowel movement was yesterday morning.  Patient states that he has intermittent left lower quadrant pain as stated over the past 2 weeks.  At has been taking MiraLAX daily as well as Metamucil.  Occasionally is having some loose bowel movements now.  No nausea no vomiting.      Past Medical History:  Diagnosis Date   Hypertension     Patient Active Problem List   Diagnosis Date Noted   Chest pain 08/30/2017   Abnormal EKG 08/30/2017   Leukocytosis 08/30/2017    Past Surgical History:  Procedure Laterality Date   APPENDECTOMY     TONSILLECTOMY         Family History  Problem Relation Age of Onset   Pancreatic cancer Mother    Hypertension Father    CAD Neg Hx    Diabetes Neg Hx     Social History   Tobacco Use   Smoking status: Never   Smokeless tobacco: Never  Vaping Use   Vaping Use: Never used  Substance Use Topics   Alcohol use: Never   Drug use: Never    Home Medications Prior to Admission medications   Medication Sig Start Date End Date Taking? Authorizing Provider  amLODipine (NORVASC) 10 MG tablet Take 10 mg by mouth daily. 03/05/14  Yes [provider]  aspirin 81 MG EC tablet Take 81 mg by mouth daily. Swallow whole.   Yes [provider]  polyethylene glycol (MIRALAX) 17 g packet Take 17 g by mouth daily as needed. 03/04/21  Yes Vanetta Mulders, MD    Allergies    Patient has no known allergies.  Review of Systems   Review of Systems  Constitutional:  Negative for chills and fever.  HENT:  Negative for ear pain and sore throat.   Eyes:  Negative for  pain and visual disturbance.  Respiratory:  Negative for cough and shortness of breath.   Cardiovascular:  Negative for chest pain and palpitations.  Gastrointestinal:  Positive for abdominal pain, constipation and diarrhea. Negative for vomiting.  Genitourinary:  Negative for dysuria and hematuria.  Musculoskeletal:  Negative for arthralgias and back pain.  Skin:  Negative for color change and rash.  Neurological:  Negative for seizures and syncope.  All other systems reviewed and are negative.  Physical Exam Updated Vital Signs BP 139/77   Pulse 61   Temp 98.5 F (36.9 C)   Resp 20   Ht 1.778 m (5\' 10" )   Wt 61.2 kg   SpO2 98%   BMI 19.37 kg/m   Physical Exam Vitals and nursing note reviewed.  Constitutional:      Appearance: Normal appearance. He is well-developed.  HENT:     Head: Normocephalic and atraumatic.  Eyes:     Extraocular Movements: Extraocular movements intact.     Conjunctiva/sclera: Conjunctivae normal.     Pupils: Pupils are equal, round, and reactive to light.  Cardiovascular:     Rate and Rhythm: Normal rate and regular rhythm.     Heart sounds: No murmur heard. Pulmonary:     Effort:  Pulmonary effort is normal. No respiratory distress.     Breath sounds: Normal breath sounds.  Abdominal:     General: There is distension.     Palpations: Abdomen is soft.     Tenderness: There is no abdominal tenderness. There is no guarding.     Hernia: No hernia is present.     Comments: Abdomen slightly distended.  Patient subjectively points to left lower quadrant for tenderness.  But no tenderness to palpation.  No guarding.  Musculoskeletal:        General: Normal range of motion.     Cervical back: Normal range of motion and neck supple.  Skin:    General: Skin is warm and dry.  Neurological:     General: No focal deficit present.     Mental Status: He is alert and oriented to person, place, and time.    ED Results / Procedures / Treatments    Labs (all labs ordered are listed, but only abnormal results are displayed) Labs Reviewed  COMPREHENSIVE METABOLIC PANEL - Abnormal; Notable for the following components:      Result Value   Glucose, Bld 123 (*)    All other components within normal limits  URINALYSIS, ROUTINE W REFLEX MICROSCOPIC - Abnormal; Notable for the following components:   Ketones, ur TRACE (*)    All other components within normal limits  LIPASE, BLOOD  CBC WITH DIFFERENTIAL/PLATELET    EKG None  Radiology CT ABDOMEN PELVIS W CONTRAST  Result Date: 03/04/2021 CLINICAL DATA:  Left lower quadrant abdominal pain. EXAM: CT ABDOMEN AND PELVIS WITH CONTRAST TECHNIQUE: Multidetector CT imaging of the abdomen and pelvis was performed using the standard protocol following bolus administration of intravenous contrast. CONTRAST:  23mL OMNIPAQUE IOHEXOL 350 MG/ML SOLN COMPARISON:  CT abdomen and pelvis 01/20/2021. FINDINGS: Lower chest: No acute abnormality. Hepatobiliary: There is a rounded hypodensity in the liver which is too small to characterize and unchanged, likely a cyst. The liver otherwise appears within normal limits. Gallbladder and bile ducts are within normal limits. Pancreas: Unremarkable. No pancreatic ductal dilatation or surrounding inflammatory changes. Spleen: There is a calcified granuloma in the spleen. The spleen is otherwise within normal limits. Adrenals/Urinary Tract: There are bilateral renal cysts and hypodensities which are too small to characterize, unchanged from the prior examination. The kidneys, adrenal glands and bladder are otherwise within normal limits. Stomach/Bowel: Stomach is within normal limits. Appendix is not visualized. No evidence of bowel wall thickening, distention, or inflammatory changes. There is sigmoid colon diverticulosis without evidence for diverticulitis. There is moderate to large stool burden. Vascular/Lymphatic: Aortic atherosclerosis. No enlarged abdominal or pelvic  lymph nodes. Reproductive: Prostate is unremarkable. Other: No abdominal wall hernia or abnormality. No abdominopelvic ascites. Musculoskeletal: Multilevel degenerative changes affect the spine. There also degenerative changes of both hips. IMPRESSION: 1. No acute localizing process in the abdomen or pelvis. 2. Sigmoid colon diverticulosis without evidence for acute diverticulitis. Electronically Signed   By: Darliss Cheney M.D.   On: 03/04/2021 16:19    Procedures Procedures   Medications Ordered in ED Medications  iohexol (OMNIPAQUE) 350 MG/ML injection 60 mL (60 mLs Intravenous Contrast Given 03/04/21 1556)    ED Course  I have reviewed the triage vital signs and the nursing notes.  Pertinent labs & imaging results that were available during my care of the patient were reviewed by me and considered in my medical decision making (see chart for details).    MDM Rules/Calculators/A&P  CT scan shows moderate to large stool burden.  No signs of obstruction.  No other acute findings.  Complete metabolic panel without any abnormalities.  No leukocytosis no significant anemia.  Lipase normal as well.  Urinalysis is pending we will wait for that.  But feel patient does have constipation.  No other acute findings.  Patient may have been measuring parallax wrong at home we will go ahead and give him the prepackets.  Have him take MiraLAX 2-4 times a day.  And follow-up with his primary care doctor.  Urinalysis without any evidence of urinary tract infection.  Final Clinical Impression(s) / ED Diagnoses Final diagnoses:  Constipation, unspecified constipation type    Rx / DC Orders ED Discharge Orders          Ordered    polyethylene glycol (MIRALAX) 17 g packet  Daily PRN        03/04/21 1640             Vanetta Mulders, MD 03/04/21 1640    Vanetta Mulders, MD 03/04/21 [phone]

## 2021-03-04 NOTE — ED Triage Notes (Signed)
Pt arrives POV with c/o left lower quadrant abdominal pain over the last 1-2 weeks.  Pt is also concerned he could be constipated.  States last BM was yesterday morning.

## 2021-03-04 NOTE — ED Notes (Signed)
[facility] is  not able to provide transport for patient for discharge. SAFE transport arranged.

## 2021-03-19 DIAGNOSIS — Z23 Encounter for immunization: Secondary | ICD-10-CM | POA: Diagnosis not present

## 2021-04-30 NOTE — Progress Notes (Signed)
CARDIOLOGY CONSULT NOTE       Patient ID: Don Howard MRN: [id number] DOB/AGE: August 29, 1926 87 y.o.  Primary Physician: Farris Has, MD Primary Cardiologist: Eden Emms   HPI:  87 y.o. referred by Dr Kateri Plummer for bradycardia.on 08/31/17  Seen inhospital for atypical chest pain March 2019 History of HTN Pulse at the time was in the high 60's low 70's ECG with NSR PVC normal intervals and no AV block  Echo 08/31/17 with EF 60-65% AV sclerosis mild AR BP Rx with Norvasc No history of syncope renal failure hypothyroidism or vascular disease Did not f/u for outpatient myovue in 2019 to further assess his chest pain   He has some fatigue but no palpitations, chest pain syncope or dyspnea   TTE 02/05/20 EF 55-60% mild AR trivial MR [place] 01/27/20 PVC;s < 1% total beats average HR 61 beats/min   He is widowed for 13 years Only son died of melanoma Has grand kids that look in on him Still driving and walks well   A bit hard of hearing No cardiac issues   ROS All other systems reviewed and negative except as noted above  Past Medical History:  Diagnosis Date   Hypertension     Family History  Problem Relation Age of Onset   Pancreatic cancer Mother    Hypertension Father    CAD Neg Hx    Diabetes Neg Hx     Social History   Socioeconomic History   Marital status: Married    Spouse name: Not on file   Number of children: Not on file   Years of education: Not on file   Highest education level: Not on file  Occupational History   Not on file  Tobacco Use   Smoking status: Never   Smokeless tobacco: Never  Vaping Use   Vaping Use: Never used  Substance and Sexual Activity   Alcohol use: Never   Drug use: Never   Sexual activity: Not on file  Other Topics Concern   Not on file  Social History Narrative   Not on file   Social Determinants of Health   Financial Resource Strain: Not on file  Food Insecurity: Not on file  Transportation Needs: Not on file  Physical Activity:  Not on file  Stress: Not on file  Social Connections: Not on file  Intimate Partner Violence: Not on file    Past Surgical History:  Procedure Laterality Date   APPENDECTOMY     TONSILLECTOMY        Current Outpatient Medications:    amLODipine (NORVASC) 10 MG tablet, Take 10 mg by mouth daily., Disp: , Rfl:    aspirin 81 MG EC tablet, Take 81 mg by mouth daily. Swallow whole., Disp: , Rfl:     Physical Exam: Blood pressure 132/60, pulse (!) 57, height 5\' 10"  (1.778 m), weight 142 lb (64.4 kg), SpO2 98 %.    Affect appropriate Healthy:  appears stated age HEENT: normal Neck supple with no adenopathy JVP normal no bruits no thyromegaly Lungs clear with no wheezing and good diaphragmatic motion Heart:  S1/S2 no murmur, no rub, gallop or click PMI normal Abdomen: benighn, BS positve, no tenderness, no AAA no bruit.  No HSM or HJR Distal pulses intact with no bruits No edema Neuro non-focal Skin warm and dry No muscular weakness   Labs:   Lab Results  Component Value Date   WBC 8.8 03/04/2021   HGB 13.5 03/04/2021   HCT 40.0 03/04/2021  MCV 94.3 03/04/2021   PLT 268 03/04/2021   No results for input(s): NA, K, CL, CO2, BUN, CREATININE, CALCIUM, PROT, BILITOT, ALKPHOS, ALT, AST, GLUCOSE in the last 168 hours.  Invalid input(s): LABALBU Lab Results  Component Value Date   TROPONINI <0.03 08/31/2017    Lab Results  Component Value Date   CHOL 110 08/31/2017   Lab Results  Component Value Date   HDL 55 08/31/2017   Lab Results  Component Value Date   LDLCALC 49 08/31/2017   Lab Results  Component Value Date   TRIG 29 08/31/2017   Lab Results  Component Value Date   CHOLHDL 2.0 08/31/2017   No results found for: LDLDIRECT    Radiology: No results found.  EKG: SR rate 63 ventricular trigeminy 05/07/2021 SR rate 50 normal    ASSESSMENT AND PLAN:   1. 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  HTN:   Well controlled.  Continue current medications and low sodium Dash type diet.   3. PVC;s asymptomatic Monitor 02/11/20 NSR average HR 61 bpm PVC;s < 1% of total beats no significant NSVT   F/U in a year   Signed: Charlton Haws 05/07/2021, 9:28 AM

## 2021-05-07 ENCOUNTER — Ambulatory Visit: Payer: Medicare Other | Admitting: Cardiovascular Disease

## 2021-05-07 ENCOUNTER — Other Ambulatory Visit: Payer: Self-pay

## 2021-05-07 ENCOUNTER — Encounter: Payer: Self-pay | Admitting: Cardiovascular Disease

## 2021-05-07 VITALS — BP 132/60 | HR 57 | Ht 70.0 in | Wt 142.0 lb

## 2021-05-07 DIAGNOSIS — I493 Ventricular premature depolarization: Secondary | ICD-10-CM

## 2021-05-07 DIAGNOSIS — I10 Essential (primary) hypertension: Secondary | ICD-10-CM

## 2021-05-07 DIAGNOSIS — R001 Bradycardia, unspecified: Secondary | ICD-10-CM

## 2021-05-07 NOTE — Patient Instructions (Signed)
Medication Instructions:  Your physician recommends that you continue on your current medications as directed. Please refer to the Current Medication list given to you today.  *If you need a refill on your cardiac medications before your next appointment, please call your pharmacy*   Lab Work: NONE If you have labs (blood work) drawn today and your tests are completely normal, you will receive your results only by: MyChart Message (if you have MyChart) OR A paper copy in the mail If you have any lab test that is abnormal or we need to change your treatment, we will call you to review the results.   Testing/Procedures: NONE   Follow-Up: At [facility], you and your health needs are our priority.  As part of our continuing mission to provide you with exceptional heart care, we have created designated Provider Care Teams.  These Care Teams include your primary Cardiologist (physician) and Advanced Practice Providers (APPs -  Physician Assistants and Nurse Practitioners) who all work together to provide you with the care you need, when you need it.  We recommend signing up for the patient portal called "MyChart".  Sign up information is provided on this After Visit Summary.  MyChart is used to connect with patients for Virtual Visits (Telemedicine).  Patients are able to view lab/test results, encounter notes, upcoming appointments, etc.  Non-urgent messages can be sent to your provider as well.   To learn more about what you can do with MyChart, go to [email or web address].    Your next appointment:   1 year(s)  The format for your next appointment:   In Person  Provider:   DR. Eden Emms   Other Instructions DASH Eating Plan DASH stands for Dietary Approaches to Stop Hypertension. The DASH eating plan is a healthy eating plan that has been shown to: Reduce high blood pressure (hypertension). Reduce your risk for type 2 diabetes, heart disease, and stroke. Help with weight  loss. What are tips for following this plan? Reading food labels Check food labels for the amount of salt (sodium) per serving. Choose foods with less than 5 percent of the Daily Value of sodium. Generally, foods with less than 300 milligrams (mg) of sodium per serving fit into this eating plan. To find whole grains, look for the word "whole" as the first word in the ingredient list. Shopping Buy products labeled as "low-sodium" or "no salt added." Buy fresh foods. Avoid canned foods and pre-made or frozen meals. Cooking Avoid adding salt when cooking. Use salt-free seasonings or herbs instead of table salt or sea salt. Check with your health care provider or pharmacist before using salt substitutes. Do not fry foods. Cook foods using healthy methods such as baking, boiling, grilling, roasting, and broiling instead. Cook with heart-healthy oils, such as olive, canola, avocado, soybean, or sunflower oil. Meal planning  Eat a balanced diet that includes: 4 or more servings of fruits and 4 or more servings of vegetables each day. Try to fill one-half of your plate with fruits and vegetables. 6-8 servings of whole grains each day. Less than 6 oz (170 g) of lean meat, poultry, or fish each day. A 3-oz (85-g) serving of meat is about the same size as a deck of cards. One egg equals 1 oz (28 g). 2-3 servings of low-fat dairy each day. One serving is 1 cup (237 mL). 1 serving of nuts, seeds, or beans 5 times each week. 2-3 servings of heart-healthy fats. Healthy fats called omega-3 fatty acids are found  in foods such as walnuts, flaxseeds, fortified milks, and eggs. These fats are also found in cold-water fish, such as sardines, salmon, and mackerel. Limit how much you eat of: Canned or prepackaged foods. Food that is high in trans fat, such as some fried foods. Food that is high in saturated fat, such as fatty meat. Desserts and other sweets, sugary drinks, and other foods with added  sugar. Full-fat dairy products. Do not salt foods before eating. Do not eat more than 4 egg yolks a week. Try to eat at least 2 vegetarian meals a week. Eat more home-cooked food and less restaurant, buffet, and fast food. Lifestyle When eating at a restaurant, ask that your food be prepared with less salt or no salt, if possible. If you drink alcohol: Limit how much you use to: 0-1 drink a day for women who are not pregnant. 0-2 drinks a day for men. Be aware of how much alcohol is in your drink. In the [place] one drink equals one 12 oz bottle of beer (355 mL), one 5 oz glass of wine (148 mL), or one 1 oz glass of hard liquor (44 mL). General information Avoid eating more than 2,300 mg of salt a day. If you have hypertension, you may need to reduce your sodium intake to 1,500 mg a day. Work with your health care provider to maintain a healthy body weight or to lose weight. Ask what an ideal weight is for you. Get at least 30 minutes of exercise that causes your heart to beat faster (aerobic exercise) most days of the week. Activities may include walking, swimming, or biking. Work with your health care provider or dietitian to adjust your eating plan to your individual calorie needs. What foods should I eat? Fruits All fresh, dried, or frozen fruit. Canned fruit in natural juice (without added sugar). Vegetables Fresh or frozen vegetables (raw, steamed, roasted, or grilled). Low-sodium or reduced-sodium tomato and vegetable juice. Low-sodium or reduced-sodium tomato sauce and tomato paste. Low-sodium or reduced-sodium canned vegetables. Grains Whole-grain or whole-wheat bread. Whole-grain or whole-wheat pasta. Brown rice. Orpah Cobb. Bulgur. Whole-grain and low-sodium cereals. Pita bread. Low-fat, low-sodium crackers. Whole-wheat flour tortillas. Meats and other proteins Skinless chicken or [place]. Ground chicken or [place]. Pork with fat trimmed off. Fish and seafood. Egg whites. Dried  beans, peas, or lentils. Unsalted nuts, nut butters, and seeds. Unsalted canned beans. Lean cuts of beef with fat trimmed off. Low-sodium, lean precooked or cured meat, such as sausages or meat loaves. Dairy Low-fat (1%) or fat-free (skim) milk. Reduced-fat, low-fat, or fat-free cheeses. Nonfat, low-sodium ricotta or cottage cheese. Low-fat or nonfat yogurt. Low-fat, low-sodium cheese. Fats and oils Soft margarine without trans fats. Vegetable oil. Reduced-fat, low-fat, or light mayonnaise and salad dressings (reduced-sodium). Canola, safflower, olive, avocado, soybean, and sunflower oils. Avocado. Seasonings and condiments Herbs. Spices. Seasoning mixes without salt. Other foods Unsalted popcorn and pretzels. Fat-free sweets. The items listed above may not be a complete list of foods and beverages you can eat. Contact a dietitian for more information. What foods should I avoid? Fruits Canned fruit in a light or heavy syrup. Fried fruit. Fruit in cream or butter sauce. Vegetables Creamed or fried vegetables. Vegetables in a cheese sauce. Regular canned vegetables (not low-sodium or reduced-sodium). Regular canned tomato sauce and paste (not low-sodium or reduced-sodium). Regular tomato and vegetable juice (not low-sodium or reduced-sodium). Rosita Fire. Olives. Grains Baked goods made with fat, such as croissants, muffins, or some breads. Dry pasta or rice  meal packs. Meats and other proteins Fatty cuts of meat. Ribs. Fried meat. Tomasa Blase. Bologna, salami, and other precooked or cured meats, such as sausages or meat loaves. Fat from the back of a pig (fatback). Bratwurst. Salted nuts and seeds. Canned beans with added salt. Canned or smoked fish. Whole eggs or egg yolks. Chicken or [place] with skin. Dairy Whole or 2% milk, cream, and half-and-half. Whole or full-fat cream cheese. Whole-fat or sweetened yogurt. Full-fat cheese. Nondairy creamers. Whipped toppings. Processed cheese and cheese  spreads. Fats and oils Butter. Stick margarine. Lard. Shortening. Ghee. Bacon fat. Tropical oils, such as coconut, palm kernel, or palm oil. Seasonings and condiments Onion salt, garlic salt, seasoned salt, table salt, and sea salt. Worcestershire sauce. Tartar sauce. Barbecue sauce. Teriyaki sauce. Soy sauce, including reduced-sodium. Steak sauce. Canned and packaged gravies. Fish sauce. Oyster sauce. Cocktail sauce. Store-bought horseradish. Ketchup. Mustard. Meat flavorings and tenderizers. Bouillon cubes. Hot sauces. Pre-made or packaged marinades. Pre-made or packaged taco seasonings. Relishes. Regular salad dressings. Other foods Salted popcorn and pretzels. The items listed above may not be a complete list of foods and beverages you should avoid. Contact a dietitian for more information. Where to find more information National Heart, Lung, and Blood Institute: PopSteam.is American Heart Association: www.heart.org Academy of Nutrition and Dietetics: www.eatright.org National Kidney Foundation: www.kidney.org Summary The DASH eating plan is a healthy eating plan that has been shown to reduce high blood pressure (hypertension). It may also reduce your risk for type 2 diabetes, heart disease, and stroke. When on the DASH eating plan, aim to eat more fresh fruits and vegetables, whole grains, lean proteins, low-fat dairy, and heart-healthy fats. With the DASH eating plan, you should limit salt (sodium) intake to 2,300 mg a day. If you have hypertension, you may need to reduce your sodium intake to 1,500 mg a day. Work with your health care provider or dietitian to adjust your eating plan to your individual calorie needs. This information is not intended to replace advice given to you by your health care provider. Make sure you discuss any questions you have with your health care provider. Document Revised: 04/26/2019 Document Reviewed: 04/26/2019 Elsevier Patient Education  2022 [employer].

## 2021-06-30 DIAGNOSIS — K5900 Constipation, unspecified: Secondary | ICD-10-CM | POA: Diagnosis not present

## 2021-06-30 DIAGNOSIS — Z0000 Encounter for general adult medical examination without abnormal findings: Secondary | ICD-10-CM | POA: Diagnosis not present

## 2021-06-30 DIAGNOSIS — I10 Essential (primary) hypertension: Secondary | ICD-10-CM | POA: Diagnosis not present

## 2021-06-30 DIAGNOSIS — L309 Dermatitis, unspecified: Secondary | ICD-10-CM | POA: Diagnosis not present

## 2021-08-02 DIAGNOSIS — H6123 Impacted cerumen, bilateral: Secondary | ICD-10-CM | POA: Diagnosis not present

## 2021-12-28 DIAGNOSIS — K5900 Constipation, unspecified: Secondary | ICD-10-CM | POA: Diagnosis not present

## 2021-12-28 DIAGNOSIS — I10 Essential (primary) hypertension: Secondary | ICD-10-CM | POA: Diagnosis not present

## 2021-12-28 DIAGNOSIS — R5383 Other fatigue: Secondary | ICD-10-CM | POA: Diagnosis not present

## 2022-04-24 ENCOUNTER — Emergency Department (HOSPITAL_BASED_OUTPATIENT_CLINIC_OR_DEPARTMENT_OTHER): Payer: Medicare Other | Admitting: Radiology

## 2022-04-24 ENCOUNTER — Encounter (HOSPITAL_BASED_OUTPATIENT_CLINIC_OR_DEPARTMENT_OTHER): Payer: Self-pay

## 2022-04-24 ENCOUNTER — Emergency Department (HOSPITAL_BASED_OUTPATIENT_CLINIC_OR_DEPARTMENT_OTHER)
Admission: EM | Admit: 2022-04-24 | Discharge: 2022-04-24 | Disposition: A | Discharge status: Home / Self Care | Payer: Medicare Other | Attending: Emergency Medicine | Admitting: Emergency Medicine

## 2022-04-24 ENCOUNTER — Other Ambulatory Visit: Payer: Self-pay

## 2022-04-24 DIAGNOSIS — M25461 Effusion, right knee: Secondary | ICD-10-CM | POA: Diagnosis not present

## 2022-04-24 DIAGNOSIS — Z7982 Long term (current) use of aspirin: Secondary | ICD-10-CM | POA: Insufficient documentation

## 2022-04-24 DIAGNOSIS — R609 Edema, unspecified: Secondary | ICD-10-CM | POA: Diagnosis not present

## 2022-04-24 DIAGNOSIS — R0689 Other abnormalities of breathing: Secondary | ICD-10-CM | POA: Diagnosis not present

## 2022-04-24 DIAGNOSIS — I959 Hypotension, unspecified: Secondary | ICD-10-CM | POA: Diagnosis not present

## 2022-04-24 DIAGNOSIS — Z79899 Other long term (current) drug therapy: Secondary | ICD-10-CM | POA: Insufficient documentation

## 2022-04-24 DIAGNOSIS — M25561 Pain in right knee: Secondary | ICD-10-CM | POA: Diagnosis not present

## 2022-04-24 LAB — SYNOVIAL CELL COUNT + DIFF, W/ CRYSTALS
Crystals, Fluid: NONE SEEN
Eosinophils-Synovial: 0 % (ref 0–1)
Lymphocytes-Synovial Fld: 2 % (ref 0–20)
Monocyte-Macrophage-Synovial Fluid: 13 % — ABNORMAL LOW (ref 50–90)
Neutrophil, Synovial: 85 % — ABNORMAL HIGH (ref 0–25)
WBC, Synovial: 16830 /mm3 — ABNORMAL HIGH (ref 0–200)

## 2022-04-24 LAB — BASIC METABOLIC PANEL
Anion gap: 8 (ref 5–15)
BUN: 18 mg/dL (ref 8–23)
CO2: 26 mmol/L (ref 22–32)
Calcium: 9 mg/dL (ref 8.9–10.3)
Chloride: 101 mmol/L (ref 98–111)
Creatinine, Ser: 0.91 mg/dL (ref 0.61–1.24)
GFR, Estimated: >60 mL/min (ref >60)
Glucose, Bld: 133 mg/dL — ABNORMAL HIGH (ref 70–99)
Potassium: 3.8 mmol/L (ref 3.5–5.1)
Sodium: 135 mmol/L (ref 135–145)

## 2022-04-24 LAB — CBC WITH DIFFERENTIAL/PLATELET
Abs Immature Granulocytes: 0.02 10*3/uL (ref 0.00–0.07)
Basophils Absolute: 0 10*3/uL (ref 0.0–0.1)
Basophils Relative: 0 %
Eosinophils Absolute: 0 10*3/uL (ref 0.0–0.5)
Eosinophils Relative: 0 %
HCT: 38.4 % — ABNORMAL LOW (ref 39.0–52.0)
Hemoglobin: 13 g/dL (ref 13.0–17.0)
Immature Granulocytes: 0 %
Lymphocytes Relative: 19 %
Lymphs Abs: 1.8 10*3/uL (ref 0.7–4.0)
MCH: 32.3 pg (ref 26.0–34.0)
MCHC: 33.9 g/dL (ref 30.0–36.0)
MCV: 95.3 fL (ref 80.0–100.0)
Monocytes Absolute: 1.2 10*3/uL — ABNORMAL HIGH (ref 0.1–1.0)
Monocytes Relative: 13 %
Neutro Abs: 6.2 10*3/uL (ref 1.7–7.7)
Neutrophils Relative %: 68 %
Platelets: 233 10*3/uL (ref 150–400)
RBC: 4.03 MIL/uL — ABNORMAL LOW (ref 4.22–5.81)
RDW: 12.5 % (ref 11.5–15.5)
WBC: 9.3 10*3/uL (ref 4.0–10.5)
nRBC: 0 % (ref 0.0–0.2)

## 2022-04-24 MED ORDER — LIDOCAINE-EPINEPHRINE (PF) 2 %-1:200000 IJ SOLN
INTRAMUSCULAR | Status: AC
  Filled 2022-04-24: qty 20

## 2022-04-24 MED ORDER — LIDOCAINE-EPINEPHRINE 2 %-1:100000 IJ SOLN: 20.0000 mL | Freq: Once | INTRAMUSCULAR | Status: DC

## 2022-04-24 MED ORDER — ACETAMINOPHEN 500 MG PO TABS
1000.0000 mg | ORAL_TABLET | Freq: Once | ORAL | Status: AC
  Administered 2022-04-24: 1000 mg via ORAL
  Filled 2022-04-24: qty 2

## 2022-04-24 MED ORDER — IBUPROFEN 800 MG PO TABS
800.0000 mg | ORAL_TABLET | Freq: Once | ORAL | Status: AC
  Administered 2022-04-24: 800 mg via ORAL
  Filled 2022-04-24: qty 1

## 2022-04-24 NOTE — Discharge Instructions (Addendum)
Thank you for allowing me to be part of your care today.  You were evaluated in the ED for swelling of your right knee.  The fluid analysis showed that this was inflammatory and not infectious.  We wrapped her knee in an Ace wrap and have provided you a walker to help you with ambulation.  Please start taking 800 mg of ibuprofen every 8 hours to treat inflammation.  You can also use ice and elevate your leg for comfort.  We are referring you to orthopedics, please call their office to schedule an appointment for a follow-up this week.  Return to the ER if you develop new or worsening symptoms.

## 2022-04-24 NOTE — ED Provider Notes (Signed)
.  Joint Aspiration/Arthrocentesis  Date/Time: 04/24/2022 4:56 PM  Performed by: Glynn Octave, MD Authorized by: Glynn Octave, MD   Consent:    Consent obtained:  Verbal   Consent given by:  Patient   Risks, benefits, and alternatives were discussed: yes     Risks discussed:  Bleeding, incomplete drainage, nerve damage, infection, pain and poor cosmetic result   Alternatives discussed:  No treatment Universal protocol:    Procedure explained and questions answered to patient or proxy's satisfaction: yes     Relevant documents present and verified: yes     Test results available: yes     Imaging studies available: yes     Required blood products, implants, devices, and special equipment available: yes     Site/side marked: yes     Immediately prior to procedure, a time out was called: yes     Patient identity confirmed:  Verbally with patient Location:    Location:  Knee   Knee:  L knee Anesthesia:    Anesthesia method:  Local infiltration   Local anesthetic:  Lidocaine 1% WITH epi Procedure details:    Preparation: Patient was prepped and draped in usual sterile fashion     Needle gauge:  18 G   Ultrasound guidance: no     Approach:  Medial   Aspirate amount:  70   Aspirate characteristics:  Blood-tinged   Steroid injected: no     Specimen collected: no   Post-procedure details:    Dressing:  Adhesive bandage   Procedure completion:  Suzi Roots, MD 04/24/22 1656

## 2022-04-24 NOTE — ED Triage Notes (Signed)
Patient BIB [facility] from Home.  Endorses Right Knee Pain and Swelling this Saturday AM. No Known injury but endorses Use Friday while working in yard.  VSS with EMS En Route.  NAD Noted during Triage. A&Ox4. GCS 15. BIB Wheelchair/Stretcher.

## 2022-04-24 NOTE — ED Provider Notes (Signed)
[facility] EMERGENCY DEPT Provider Note   CSN: [id number] Arrival date & time: 04/24/22  1129     History  Chief Complaint  Patient presents with   Knee Pain    Don Howard is a 88 y.o. male presents to the ER complaining of right knee pain and swelling that started this morning.  He reports no known injury but does state he was working in the yard and [employer] on Friday.  He has been unable to ambulate today due to pain in his knee, has reduced range of motion of knee due to swelling and pain.  Denies fever, chills, shortness of breath, chest pain, redness or warmth to his knee.  He reports no prior history of joint problems or gout.       Home Medications Prior to Admission medications   Medication Sig Start Date End Date Taking? Authorizing Provider  amLODipine (NORVASC) 10 MG tablet Take 10 mg by mouth daily. 03/05/14   [provider]  aspirin 81 MG EC tablet Take 81 mg by mouth daily. Swallow whole.    [provider]      Allergies    Patient has no known allergies.    Review of Systems   Review of Systems  Constitutional:  Negative for fever.  Respiratory:  Negative for shortness of breath.   Cardiovascular:  Negative for chest pain.  Musculoskeletal:  Positive for gait problem and joint swelling.       Right knee swelling with pain  Skin:  Negative for color change and wound.  Neurological:  Negative for weakness and numbness.    Physical Exam Updated Vital Signs BP 118/66   Pulse 62   Temp 98.4 F (36.9 C) (Oral)   Resp 18   Ht 5\' 10"  (1.778 m)   Wt 64.4 kg   SpO2 99%   BMI 20.37 kg/m  Physical Exam Vitals and nursing note reviewed.  Constitutional:      General: He is not in acute distress.    Appearance: Normal appearance. He is not ill-appearing or diaphoretic.  Cardiovascular:     Rate and Rhythm: Normal rate and regular rhythm.     Pulses:          Popliteal pulses are 2+ on the right side.        Dorsalis pedis pulses are 2+ on the right side.     Heart sounds: Normal heart sounds.  Pulmonary:     Effort: Pulmonary effort is normal.     Breath sounds: Normal breath sounds and air entry.  Musculoskeletal:     Right knee: Swelling and effusion present. No erythema or bony tenderness. Decreased range of motion. Tenderness present.     Right lower leg: Normal. No edema.     Left lower leg: Normal. No edema.  Skin:    General: Skin is warm and dry.     Capillary Refill: Capillary refill takes less than 2 seconds.  Neurological:     Mental Status: He is alert. Mental status is at baseline.  Psychiatric:        Mood and Affect: Mood normal.        Behavior: Behavior normal.     ED Results / Procedures / Treatments   Labs (all labs ordered are listed, but only abnormal results are displayed) Labs Reviewed  SYNOVIAL CELL COUNT + DIFF, W/ CRYSTALS - Abnormal; Notable for the following components:      Result Value   Color,  Synovial AMBER (*)    Appearance-Synovial TURBID (*)    WBC, Synovial 16,830 (*)    Neutrophil, Synovial 85 (*)    Monocyte-Macrophage-Synovial Fluid 13 (*)    All other components within normal limits  CBC WITH DIFFERENTIAL/PLATELET - Abnormal; Notable for the following components:   RBC 4.03 (*)    HCT 38.4 (*)    Monocytes Absolute 1.2 (*)    All other components within normal limits  BASIC METABOLIC PANEL - Abnormal; Notable for the following components:   Glucose, Bld 133 (*)    All other components within normal limits  BODY FLUID CULTURE W GRAM STAIN  GLUCOSE, BODY FLUID OTHER            PROTEIN, BODY FLUID (OTHER)    EKG None  Radiology DG Knee Complete 4 Views Right  Result Date: 04/24/2022 CLINICAL DATA:  Right knee pain and swelling.  No known injury. EXAM: RIGHT KNEE - COMPLETE 4 VIEW COMPARISON:  None Available. FINDINGS: There are no findings of fracture or dislocation. Moderate joint effusion. Tricompartmental degenerative changes of  the knee, most pronounced in the patellofemoral compartment. Chondrocalcinosis of the knee. Patellar and quadriceps enthesophytes. Soft tissues are unremarkable. IMPRESSION: 1. Moderate joint effusion. 2. Tricompartmental degenerative changes of the knee, most pronounced in the patellofemoral compartment. Electronically Signed   By: Agustin Cree M.D.   On: 04/24/2022 12:29    Procedures Procedures    Medications Ordered in ED Medications  lidocaine-EPINEPHrine (XYLOCAINE W/EPI) 2 %-1:100000 (with pres) injection 20 mL (has no administration in time range)  lidocaine-EPINEPHrine (XYLOCAINE W/EPI) 2 %-1:200000 (PF) injection (has no administration in time range)  acetaminophen (TYLENOL) tablet 1,000 mg (1,000 mg Oral Given 04/24/22 1834)  ibuprofen (ADVIL) tablet 800 mg (800 mg Oral Given 04/24/22 2057)    ED Course/ Medical Decision Making/ A&P                           Medical Decision Making Amount and/or Complexity of Data Reviewed Labs: ordered. Radiology: ordered.  Risk OTC drugs. Prescription drug management.   This patient presents to the ED with chief complaint(s) of sudden onset right knee swelling and pain.  He does not recall any injury and denies any trauma or falls.  He does not have a history of joint problems and no previous history of gout.  He has not been febrile.  The differential diagnosis includes septic arthritis, inflammatory joint effusion, gout, pseudogout, noninflammatory joint effusion, hemorrhagic joint effusion  The initial plan is to obtain baseline labs and perform joint aspiration to collect synovial fluid for analysis  Additional history obtained: Additional history obtained from family  Initial Assessment:   On exam, patient appears comfortable and in no acute distress.  Right knee is obviously swollen and tender.  2+ popliteal pulse palpated.  There is no erythema or increased warmth around the knee.  Sensation is intact.  Range of motion is limited  by pain.  Independent ECG/labs interpretation:  The following labs were independently interpreted:  CBC reveals no anemia or leukocytosis to suggest widespread infection.  Metabolic panel shows no electrolyte disturbance, good kidney function.  Synovial fluid analysis revealed amber, turbid appearance with no crystals, WBC 16,830, predominantly PMN.  No microorganisms.  Analysis is consistent with inflammatory joint effusion, does not appear to be septic effusion.  Independent visualization and interpretation of imaging: I independently visualized the following imaging with scope of interpretation limited to determining acute  life threatening conditions related to emergency care: Plain film of right knee, which revealed moderate joint effusion with arthritic changes.  I agree with radiologist interpretation.  Treatment and Reassessment: We will start patient on ibuprofen while in ED and provide Ace wrap and walker.  He reports he has slightly better movement of his knee after the joint aspiration, but states it is still uncomfortable.  Disposition:   Patient is stable, without evidence of septic arthritis, and appropriate for discharge home with outpatient orthopedic follow-up.  He reports mild improvement in range of motion following the joint aspiration.  His right knee remains swollen, we will wrap it in an Ace wrap and provide him a walker for safe ambulation at home.  Discussed findings and plan of treatment with patient and family at bedside who expressed their verbal understanding and agree with plan and follow-up with orthopedics.  Return precautions were given.         Final Clinical Impression(s) / ED Diagnoses Final diagnoses:  Effusion of right knee    Rx / DC Orders ED Discharge Orders     None         Lenard Simmer, [place] 04/24/22 2112    Glynn Octave, MD 04/25/22 0104

## 2022-04-24 NOTE — ED Notes (Signed)
ACE wrap applied to right knee. Pt ambulated on walker without difficulty.

## 2022-04-26 LAB — GLUCOSE, BODY FLUID OTHER: Glucose, Body Fluid Other: 58 mg/dL

## 2022-04-26 LAB — PROTEIN, BODY FLUID (OTHER): Total Protein, Body Fluid Other: 3.9 g/dL

## 2022-04-27 LAB — BODY FLUID CULTURE W GRAM STAIN: Culture: NO GROWTH

## 2022-05-02 ENCOUNTER — Telehealth: Payer: Self-pay

## 2022-05-02 NOTE — Telephone Encounter (Signed)
        Patient  visited [facility] on 11/19    Telephone encounter attempt :  1st  A HIPAA compliant voice message was left requesting a return call.  Instructed patient to call back    Shakeitha Umbaugh [facility] Guide, [place], Care Management  [phone] [address], [place],  [address] Phone: [phone] Email: [email or web address]       

## 2022-05-05 DIAGNOSIS — M25561 Pain in right knee: Secondary | ICD-10-CM | POA: Diagnosis not present

## 2022-05-10 DIAGNOSIS — Z23 Encounter for immunization: Secondary | ICD-10-CM | POA: Diagnosis not present

## 2022-07-11 DIAGNOSIS — H6123 Impacted cerumen, bilateral: Secondary | ICD-10-CM | POA: Diagnosis not present

## 2022-07-11 DIAGNOSIS — Z0000 Encounter for general adult medical examination without abnormal findings: Secondary | ICD-10-CM | POA: Diagnosis not present

## 2022-07-11 DIAGNOSIS — K5900 Constipation, unspecified: Secondary | ICD-10-CM | POA: Diagnosis not present

## 2022-07-11 DIAGNOSIS — I10 Essential (primary) hypertension: Secondary | ICD-10-CM | POA: Diagnosis not present

## 2022-07-11 DIAGNOSIS — M25561 Pain in right knee: Secondary | ICD-10-CM | POA: Diagnosis not present

## 2022-07-11 DIAGNOSIS — I700 Atherosclerosis of aorta: Secondary | ICD-10-CM | POA: Diagnosis not present

## 2022-07-22 DIAGNOSIS — Z03818 Encounter for observation for suspected exposure to other biological agents ruled out: Secondary | ICD-10-CM | POA: Diagnosis not present

## 2022-07-22 DIAGNOSIS — J988 Other specified respiratory disorders: Secondary | ICD-10-CM | POA: Diagnosis not present

## 2022-07-22 DIAGNOSIS — I10 Essential (primary) hypertension: Secondary | ICD-10-CM | POA: Diagnosis not present

## 2022-07-22 DIAGNOSIS — R0989 Other specified symptoms and signs involving the circulatory and respiratory systems: Secondary | ICD-10-CM | POA: Diagnosis not present

## 2022-07-28 DIAGNOSIS — R42 Dizziness and giddiness: Secondary | ICD-10-CM | POA: Diagnosis not present

## 2022-07-28 DIAGNOSIS — R5383 Other fatigue: Secondary | ICD-10-CM | POA: Diagnosis not present

## 2022-09-15 ENCOUNTER — Encounter: Payer: Self-pay | Admitting: Physician Assistant

## 2022-09-15 ENCOUNTER — Ambulatory Visit: Payer: Medicare Other | Attending: Physician Assistant | Admitting: Physician Assistant

## 2022-09-15 VITALS — BP 143/65 | HR 53 | Ht 70.0 in | Wt 137.0 lb

## 2022-09-15 DIAGNOSIS — R531 Weakness: Secondary | ICD-10-CM

## 2022-09-15 DIAGNOSIS — I493 Ventricular premature depolarization: Secondary | ICD-10-CM

## 2022-09-15 DIAGNOSIS — I10 Essential (primary) hypertension: Secondary | ICD-10-CM | POA: Diagnosis not present

## 2022-09-15 DIAGNOSIS — R001 Bradycardia, unspecified: Secondary | ICD-10-CM | POA: Diagnosis not present

## 2022-09-15 DIAGNOSIS — R5383 Other fatigue: Secondary | ICD-10-CM

## 2022-09-15 NOTE — Patient Instructions (Signed)
Medication Instructions:  Your physician recommends that you continue on your current medications as directed. Please refer to the Current Medication list given to you today.  *If you need a refill on your cardiac medications before your next appointment, please call your pharmacy*   Lab Work: CMET, MAG, CBC, TSH--TODAY If you have labs (blood work) drawn today and your tests are completely normal, you will receive your results only by: MyChart Message (if you have MyChart) OR A paper copy in the mail If you have any lab test that is abnormal or we need to change your treatment, we will call you to review the results.   Testing/Procedures: Your physician has requested that you have an echocardiogram. Echocardiography is a painless test that uses sound waves to create images of your heart. It provides your doctor with information about the size and shape of your heart and how well your heart's chambers and valves are working. This procedure takes approximately one hour. There are no restrictions for this procedure. Please do NOT wear cologne, perfume, aftershave, or lotions (deodorant is allowed). Please arrive 15 minutes prior to your appointment time.    Follow-Up: At [facility], you and your health needs are our priority.  As part of our continuing mission to provide you with exceptional heart care, we have created designated Provider Care Teams.  These Care Teams include your primary Cardiologist (physician) and Advanced Practice Providers (APPs -  Physician Assistants and Nurse Practitioners) who all work together to provide you with the care you need, when you need it.  We recommend signing up for the patient portal called "MyChart".  Sign up information is provided on this After Visit Summary.  MyChart is used to connect with patients for Virtual Visits (Telemedicine).  Patients are able to view lab/test results, encounter notes, upcoming appointments, etc.  Non-urgent messages  can be sent to your provider as well.   To learn more about what you can do with MyChart, go to [email or web address].    Your next appointment:   6 month(s)  Provider:   Dr Eden EmmsNishan  Low-Sodium Eating Plan Sodium, which is an element that makes up salt, helps you maintain a healthy balance of fluids in your body. Too much sodium can increase your blood pressure and cause fluid and waste to be held in your body. Your health care provider or dietitian may recommend following this plan if you have high blood pressure (hypertension), kidney disease, liver disease, or heart failure. Eating less sodium can help lower your blood pressure, reduce swelling, and protect your heart, liver, and kidneys. What are tips for following this plan? Reading food labels The Nutrition Facts label lists the amount of sodium in one serving of the food. If you eat more than one serving, you must multiply the listed amount of sodium by the number of servings. Choose foods with less than 140 mg of sodium per serving. Avoid foods with 300 mg of sodium or more per serving. Shopping  Look for lower-sodium products, often labeled as "low-sodium" or "no salt added." Always check the sodium content, even if foods are labeled as "unsalted" or "no salt added." Buy fresh foods. Avoid canned foods and pre-made or frozen meals. Avoid canned, cured, or processed meats. Buy breads that have less than 80 mg of sodium per slice. Cooking  Eat more home-cooked food and less restaurant, buffet, and fast food. Avoid adding salt when cooking. Use salt-free seasonings or herbs instead of table salt or sea  salt. Check with your health care provider or pharmacist before using salt substitutes. Cook with plant-based oils, such as canola, sunflower, or olive oil. Meal planning When eating at a restaurant, ask that your food be prepared with less salt or no salt, if possible. Avoid dishes labeled as brined, pickled, cured, smoked, or  made with soy sauce, miso, or teriyaki sauce. Avoid foods that contain MSG (monosodium glutamate). MSG is sometimes added to [place] food, bouillon, and some canned foods. Make meals that can be grilled, baked, poached, roasted, or steamed. These are generally made with less sodium. General information Most people on this plan should limit their sodium intake to 1,500-2,000 mg (milligrams) of sodium each day. What foods should I eat? Fruits Fresh, frozen, or canned fruit. Fruit juice. Vegetables Fresh or frozen vegetables. "No salt added" canned vegetables. "No salt added" tomato sauce and paste. Low-sodium or reduced-sodium tomato and vegetable juice. Grains Low-sodium cereals, including oats, puffed wheat and rice, and shredded wheat. Low-sodium crackers. Unsalted rice. Unsalted pasta. Low-sodium bread. Whole-grain breads and whole-grain pasta. Meats and other proteins Fresh or frozen (no salt added) meat, poultry, seafood, and fish. Low-sodium canned tuna and salmon. Unsalted nuts. Dried peas, beans, and lentils without added salt. Unsalted canned beans. Eggs. Unsalted nut butters. Dairy Milk. Soy milk. Cheese that is naturally low in sodium, such as ricotta cheese, fresh mozzarella, or Swiss cheese. Low-sodium or reduced-sodium cheese. Cream cheese. Yogurt. Seasonings and condiments Fresh and dried herbs and spices. Salt-free seasonings. Low-sodium mustard and ketchup. Sodium-free salad dressing. Sodium-free light mayonnaise. Fresh or refrigerated horseradish. Lemon juice. Vinegar. Other foods Homemade, reduced-sodium, or low-sodium soups. Unsalted popcorn and pretzels. Low-salt or salt-free chips. The items listed above may not be a complete list of foods and beverages you can eat. Contact a dietitian for more information. What foods should I avoid? Vegetables Sauerkraut, pickled vegetables, and relishes. Olives. [place] fries. Onion rings. Regular canned vegetables (not low-sodium or  reduced-sodium). Regular canned tomato sauce and paste (not low-sodium or reduced-sodium). Regular tomato and vegetable juice (not low-sodium or reduced-sodium). Frozen vegetables in sauces. Grains Instant hot cereals. Bread stuffing, pancake, and biscuit mixes. Croutons. Seasoned rice or pasta mixes. Noodle soup cups. Boxed or frozen macaroni and cheese. Regular salted crackers. Self-rising flour. Meats and other proteins Meat or fish that is salted, canned, smoked, spiced, or pickled. Precooked or cured meat, such as sausages or meat loaves. Tomasa Blase. Ham. Pepperoni. Hot dogs. Corned beef. Chipped beef. Salt pork. Jerky. Pickled herring. Anchovies and sardines. Regular canned tuna. Salted nuts. Dairy Processed cheese and cheese spreads. Hard cheeses. Cheese curds. Blue cheese. Feta cheese. String cheese. Regular cottage cheese. Buttermilk. Canned milk. Fats and oils Salted butter. Regular margarine. Ghee. Bacon fat. Seasonings and condiments Onion salt, garlic salt, seasoned salt, table salt, and sea salt. Canned and packaged gravies. Worcestershire sauce. Tartar sauce. Barbecue sauce. Teriyaki sauce. Soy sauce, including reduced-sodium. Steak sauce. Fish sauce. Oyster sauce. Cocktail sauce. Horseradish that you find on the shelf. Regular ketchup and mustard. Meat flavorings and tenderizers. Bouillon cubes. Hot sauce. Pre-made or packaged marinades. Pre-made or packaged taco seasonings. Relishes. Regular salad dressings. Salsa. Other foods Salted popcorn and pretzels. Corn chips and puffs. Potato and tortilla chips. Canned or dried soups. Pizza. Frozen entrees and pot pies. The items listed above may not be a complete list of foods and beverages you should avoid. Contact a dietitian for more information. Summary Eating less sodium can help lower your blood pressure, reduce swelling, and protect  your heart, liver, and kidneys. Most people on this plan should limit their sodium intake to 1,500-2,000 mg  (milligrams) of sodium each day. Canned, boxed, and frozen foods are high in sodium. Restaurant foods, fast foods, and pizza are also very high in sodium. You also get sodium by adding salt to food. Try to cook at home, eat more fresh fruits and vegetables, and eat less fast food and canned, processed, or prepared foods. This information is not intended to replace advice given to you by your health care provider. Make sure you discuss any questions you have with your health care provider. Document Revised: 04/29/2019 Document Reviewed: 04/24/2019 Elsevier Patient Education  2023 [employer]  Heart-Healthy Eating Plan Many factors influence your heart health, including eating and exercise habits. Heart health is also called coronary health. Coronary risk increases with abnormal blood fat (lipid) levels. A heart-healthy eating plan includes limiting unhealthy fats, increasing healthy fats, limiting salt (sodium) intake, and making other diet and lifestyle changes. What is my plan? Your health care provider may recommend that: You limit your fat intake to _________% or less of your total calories each day. You limit your saturated fat intake to _________% or less of your total calories each day. You limit the amount of cholesterol in your diet to less than _________ mg per day. You limit the amount of sodium in your diet to less than _________ mg per day. What are tips for following this plan? Cooking Cook foods using methods other than frying. Baking, boiling, grilling, and broiling are all good options. Other ways to reduce fat include: Removing the skin from poultry. Removing all visible fats from meats. Steaming vegetables in water or broth. Meal planning  At meals, imagine dividing your plate into fourths: Fill one-half of your plate with vegetables and green salads. Fill one-fourth of your plate with whole grains. Fill one-fourth of your plate with lean protein foods. Eat 2-4 cups  of vegetables per day. One cup of vegetables equals 1 cup (91 g) broccoli or cauliflower florets, 2 medium carrots, 1 large bell pepper, 1 large sweet potato, 1 large tomato, 1 medium white potato, 2 cups (150 g) raw leafy greens. Eat 1-2 cups of fruit per day. One cup of fruit equals 1 small apple, 1 large banana, 1 cup (237 g) mixed fruit, 1 large orange,  cup (82 g) dried fruit, 1 cup (240 mL) 100% fruit juice. Eat more foods that contain soluble fiber. Examples include apples, broccoli, carrots, beans, peas, and barley. Aim to get 25-30 g of fiber per day. Increase your consumption of legumes, nuts, and seeds to 4-5 servings per week. One serving of dried beans or legumes equals  cup (90 g) cooked, 1 serving of nuts is  oz (12 almonds, 24 pistachios, or 7 walnut halves), and 1 serving of seeds equals  oz (8 g). Fats Choose healthy fats more often. Choose monounsaturated and polyunsaturated fats, such as olive and canola oils, avocado oil, flaxseeds, walnuts, almonds, and seeds. Eat more omega-3 fats. Choose salmon, mackerel, sardines, tuna, flaxseed oil, and ground flaxseeds. Aim to eat fish at least 2 times each week. Check food labels carefully to identify foods with trans fats or high amounts of saturated fat. Limit saturated fats. These are found in animal products, such as meats, butter, and cream. Plant sources of saturated fats include palm oil, palm kernel oil, and coconut oil. Avoid foods with partially hydrogenated oils in them. These contain trans fats. Examples are stick  margarine, some tub margarines, cookies, crackers, and other baked goods. Avoid fried foods. General information Eat more home-cooked food and less restaurant, buffet, and fast food. Limit or avoid alcohol. Limit foods that are high in added sugar and simple starches such as foods made using white refined flour (white breads, pastries, sweets). Lose weight if you are overweight. Losing just 5-10% of your body  weight can help your overall health and prevent diseases such as diabetes and heart disease. Monitor your sodium intake, especially if you have high blood pressure. Talk with your health care provider about your sodium intake. Try to incorporate more vegetarian meals weekly. What foods should I eat? Fruits All fresh, canned (in natural juice), or frozen fruits. Vegetables Fresh or frozen vegetables (raw, steamed, roasted, or grilled). Green salads. Grains Most grains. Choose whole wheat and whole grains most of the time. Rice and pasta, including brown rice and pastas made with whole wheat. Meats and other proteins Lean, well-trimmed beef, veal, pork, and lamb. Chicken and [place] without skin. All fish and shellfish. Wild duck, rabbit, pheasant, and venison. Egg whites or low-cholesterol egg substitutes. Dried beans, peas, lentils, and tofu. Seeds and most nuts. Dairy Low-fat or nonfat cheeses, including ricotta and mozzarella. Skim or 1% milk (liquid, powdered, or evaporated). Buttermilk made with low-fat milk. Nonfat or low-fat yogurt. Fats and oils Non-hydrogenated (trans-free) margarines. Vegetable oils, including soybean, sesame, sunflower, olive, avocado, peanut, safflower, corn, canola, and cottonseed. Salad dressings or mayonnaise made with a vegetable oil. Beverages Water (mineral or sparkling). Coffee and tea. Unsweetened ice tea. Diet beverages. Sweets and desserts Sherbet, gelatin, and fruit ice. Small amounts of dark chocolate. Limit all sweets and desserts. Seasonings and condiments All seasonings and condiments. The items listed above may not be a complete list of foods and beverages you can eat. Contact a dietitian for more options. What foods should I avoid? Fruits Canned fruit in heavy syrup. Fruit in cream or butter sauce. Fried fruit. Limit coconut. Vegetables Vegetables cooked in cheese, cream, or butter sauce. Fried vegetables. Grains Breads made with saturated or  trans fats, oils, or whole milk. Croissants. Sweet rolls. Donuts. High-fat crackers, such as cheese crackers and chips. Meats and other proteins Fatty meats, such as hot dogs, ribs, sausage, bacon, rib-eye roast or steak. High-fat deli meats, such as salami and bologna. Caviar. Domestic duck and goose. Organ meats, such as liver. Dairy Cream, sour cream, cream cheese, and creamed cottage cheese. Whole-milk cheeses. Whole or 2% milk (liquid, evaporated, or condensed). Whole buttermilk. Cream sauce or high-fat cheese sauce. Whole-milk yogurt. Fats and oils Meat fat, or shortening. Cocoa butter, hydrogenated oils, palm oil, coconut oil, palm kernel oil. Solid fats and shortenings, including bacon fat, salt pork, lard, and butter. Nondairy cream substitutes. Salad dressings with cheese or sour cream. Beverages Regular sodas and any drinks with added sugar. Sweets and desserts Frosting. Pudding. Cookies. Cakes. Pies. Milk chocolate or white chocolate. Buttered syrups. Full-fat ice cream or ice cream drinks. The items listed above may not be a complete list of foods and beverages to avoid. Contact a dietitian for more information. Summary Heart-healthy meal planning includes limiting unhealthy fats, increasing healthy fats, limiting salt (sodium) intake and making other diet and lifestyle changes. Lose weight if you are overweight. Losing just 5-10% of your body weight can help your overall health and prevent diseases such as diabetes and heart disease. Focus on eating a balance of foods, including fruits and vegetables, low-fat or nonfat dairy, lean protein,  nuts and legumes, whole grains, and heart-healthy oils and fats. This information is not intended to replace advice given to you by your health care provider. Make sure you discuss any questions you have with your health care provider. Document Revised: 06/28/2021 Document Reviewed: 06/28/2021 Elsevier Patient Education  2023 [employer].

## 2022-09-15 NOTE — Progress Notes (Signed)
Office Visit    Patient Name: Don Howard Date of Encounter: 09/15/2022  PCP:  Farris Has, MD   [facility]  Cardiologist:  Charlton Haws, MD  Advanced Practice Provider:  No care team member to display Electrophysiologist:  None   HPI    Don Howard is a 89 y.o. male with a past medical history of hypertension presents today for follow-up appointment.  He tells me that he was seen at the first week of February with a respiratory infection.  He was having some fatigue and weakness at that time.  He was started on antibiotic and took that for about 2 and after a few days experienced some vertigo which eventually subsided.  He tells me that he stays tired all the time now and he is only able to do yard work for about 15 or 20 minutes without needing to take a break.  No shortness of breath, chest pain, or lower extremity swelling.  He was told to eat more protein so he has been trying to drink Ensure and eat peanut butter and eggs.  His appetite is not back to normal.  It looks like the last time he was seen in the office December 2022 he was also having some fatigue at that time and echocardiogram was reviewed from 9/21 with EF 55 to 60%, mild AR and trivial MR.  Reports no shortness of breath nor dyspnea on exertion. Reports no chest pain, pressure, or tightness. No edema, orthopnea, PND. Reports no palpitations.   Past Medical History    Past Medical History:  Diagnosis Date   Hypertension    Past Surgical History:  Procedure Laterality Date   APPENDECTOMY     TONSILLECTOMY      Allergies  No Known Allergies  EKGs/Labs/Other Studies Reviewed:   The following studies were reviewed today:  Echocardiogram 02/05/2020 IMPRESSIONS     1. Left ventricular ejection fraction, by estimation, is 55 to 60%. The  left ventricle has normal function. The left ventricle has no regional  wall motion abnormalities. Left ventricular diastolic parameters  are  consistent with age-related delayed  relaxation (normal).   2. Right ventricular systolic function is normal. The right ventricular  size is normal. There is severely elevated pulmonary artery systolic  pressure.   3. Left atrial size was moderately dilated.   4. The mitral valve is normal in structure. Trivial mitral valve  regurgitation. No evidence of mitral stenosis.   5. The aortic valve is tricuspid. Aortic valve regurgitation is mild.  Mild to moderate aortic valve sclerosis/calcification is present, without  any evidence of aortic stenosis.   6. The inferior vena cava is normal in size with greater than 50%  respiratory variability, suggesting right atrial pressure of 3 mmHg.   FINDINGS   Left Ventricle: Left ventricular ejection fraction, by estimation, is 55  to 60%. The left ventricle has normal function. The left ventricle has no  regional wall motion abnormalities. The left ventricular internal cavity  size was normal in size. There is   no left ventricular hypertrophy. Left ventricular diastolic parameters  are consistent with age-related delayed relaxation (normal).   Right Ventricle: The right ventricular size is normal. No increase in  right ventricular wall thickness. Right ventricular systolic function is  normal. There is severely elevated pulmonary artery systolic pressure. The  tricuspid regurgitant velocity is  6.27 m/s, and with an assumed right atrial pressure of 3 mmHg, the  estimated right  ventricular systolic pressure is 160.3 mmHg.   Left Atrium: Left atrial size was moderately dilated.   Right Atrium: Right atrial size was normal in size.   Pericardium: There is no evidence of pericardial effusion.   Mitral Valve: The mitral valve is normal in structure. There is mild  thickening of the mitral valve leaflet(s). There is mild calcification of  the mitral valve leaflet(s). Normal mobility of the mitral valve leaflets.  Trivial mitral valve  regurgitation.  No evidence of mitral valve stenosis.   Tricuspid Valve: The tricuspid valve is normal in structure. Tricuspid  valve regurgitation is mild . No evidence of tricuspid stenosis.   Aortic Valve: The aortic valve is tricuspid. Aortic valve regurgitation is  mild. Aortic regurgitation PHT measures 725 msec. Mild to moderate aortic  valve sclerosis/calcification is present, without any evidence of aortic  stenosis.   Pulmonic Valve: The pulmonic valve was normal in structure. Pulmonic valve  regurgitation is not visualized. No evidence of pulmonic stenosis.   Aorta: The aortic root is normal in size and structure.   Venous: The inferior vena cava is normal in size with greater than 50%  respiratory variability, suggesting right atrial pressure of 3 mmHg.   IAS/Shunts: No atrial level shunt detected by color flow Doppler.       EKG:  EKG is  ordered today.  The ekg ordered today demonstrates sinus bradycardia rate 54 bpm, incomplete LBBB  Recent Labs: 04/24/2022: BUN 18; Creatinine, Ser 0.91; Hemoglobin 13.0; Platelets 233; Potassium 3.8; Sodium 135  Recent Lipid Panel    Component Value Date/Time   CHOL 110 08/31/2017 0207   TRIG 29 08/31/2017 0207   HDL 55 08/31/2017 0207   CHOLHDL 2.0 08/31/2017 0207   VLDL 6 08/31/2017 0207   LDLCALC 49 08/31/2017 0207    Home Medications   Current Meds  Medication Sig   amLODipine (NORVASC) 10 MG tablet Take 10 mg by mouth daily.   aspirin 81 MG EC tablet Take 81 mg by mouth daily. Swallow whole.     Review of Systems      All other systems reviewed and are otherwise negative except as noted above.  Physical Exam    VS:  BP (!) 143/65   Pulse (!) 53   Ht 5\' 10"  (1.778 m)   Wt 137 lb (62.1 kg)   SpO2 95%   BMI 19.66 kg/m  , BMI Body mass index is 19.66 kg/m.  Wt Readings from Last 3 Encounters:  09/15/22 137 lb (62.1 kg)  04/24/22 141 lb 15.6 oz (64.4 kg)  05/07/21 142 lb (64.4 kg)     GEN: Well  nourished, well developed, in no acute distress. HEENT: normal. Neck: Supple, no JVD, carotid bruits, or masses. Cardiac: RRR, no murmurs, rubs, or gallops. No clubbing, cyanosis, edema.  Radials/PT 2+ and equal bilaterally.  Respiratory:  Respirations regular and unlabored, clear to auscultation bilaterally. GI: Soft, nontender, nondistended. MS: No deformity or atrophy. Skin: Warm and dry, no rash. Neuro:  Strength and sensation are intact. Psych: Normal affect.  Assessment & Plan    Fatigue/weakness -Recent infection, upper aspiratory about a month ago -Will plan to check labs today including CBC, CMP, TSH, magnesium -Will plan for echocardiogram  HTN -Continue current medication which includes amlodipine 10 mg daily and aspirin 81 mg daily -Well-controlled today, 143/65  Disposition: Follow up 6 months with Charlton Haws, MD or APP.  Signed, Sharlene Dory, PA-C 09/15/2022, 4:24 PM [facility]

## 2022-09-16 LAB — CBC
Hematocrit: 37.7 % (ref 37.5–51.0)
Hemoglobin: 12.8 g/dL — ABNORMAL LOW (ref 13.0–17.7)
MCH: 31.4 pg (ref 26.6–33.0)
MCHC: 34 g/dL (ref 31.5–35.7)
MCV: 92 fL (ref 79–97)
Platelets: 335 10*3/uL (ref 150–450)
RBC: 4.08 x10E6/uL — ABNORMAL LOW (ref 4.14–5.80)
RDW: 12 % (ref 11.6–15.4)
WBC: 9.4 10*3/uL (ref 3.4–10.8)

## 2022-09-16 LAB — COMPREHENSIVE METABOLIC PANEL
ALT: 15 IU/L (ref 0–44)
AST: 21 IU/L (ref 0–40)
Albumin/Globulin Ratio: 1.7 (ref 1.2–2.2)
Albumin: 4.3 g/dL (ref 3.6–4.6)
Alkaline Phosphatase: 71 IU/L (ref 44–121)
BUN/Creatinine Ratio: 31 — ABNORMAL HIGH (ref 10–24)
BUN: 28 mg/dL (ref 10–36)
Bilirubin Total: 0.4 mg/dL (ref 0.0–1.2)
CO2: 25 mmol/L (ref 20–29)
Calcium: 9.5 mg/dL (ref 8.6–10.2)
Chloride: 98 mmol/L (ref 96–106)
Creatinine, Ser: 0.91 mg/dL (ref 0.76–1.27)
Globulin, Total: 2.5 g/dL (ref 1.5–4.5)
Glucose: 92 mg/dL (ref 70–99)
Potassium: 4.8 mmol/L (ref 3.5–5.2)
Sodium: 136 mmol/L (ref 134–144)
Total Protein: 6.8 g/dL (ref 6.0–8.5)
eGFR: 78 mL/min/{1.73_m2} (ref >59)

## 2022-09-16 LAB — MAGNESIUM: Magnesium: 2.3 mg/dL (ref 1.6–2.3)

## 2022-09-16 LAB — TSH: TSH: 3.58 u[IU]/mL (ref 0.450–4.500)

## 2022-10-11 ENCOUNTER — Ambulatory Visit: Payer: Medicare Other | Admitting: Podiatry

## 2022-10-13 ENCOUNTER — Ambulatory Visit (HOSPITAL_COMMUNITY): Payer: Medicare Other | Attending: Physician Assistant

## 2022-10-13 DIAGNOSIS — I10 Essential (primary) hypertension: Secondary | ICD-10-CM | POA: Diagnosis not present

## 2022-10-13 DIAGNOSIS — R531 Weakness: Secondary | ICD-10-CM | POA: Diagnosis not present

## 2022-10-13 DIAGNOSIS — R5383 Other fatigue: Secondary | ICD-10-CM | POA: Diagnosis not present

## 2022-10-13 LAB — ECHOCARDIOGRAM COMPLETE
Area-P 1/2: 1.42 cm2
P 1/2 time: 708 msec
S' Lateral: 2.7 cm

## 2022-10-18 ENCOUNTER — Ambulatory Visit: Payer: Medicare Other | Admitting: Podiatry

## 2022-11-03 DIAGNOSIS — R109 Unspecified abdominal pain: Secondary | ICD-10-CM | POA: Diagnosis not present

## 2022-11-03 DIAGNOSIS — K5900 Constipation, unspecified: Secondary | ICD-10-CM | POA: Diagnosis not present

## 2023-01-10 DIAGNOSIS — I10 Essential (primary) hypertension: Secondary | ICD-10-CM | POA: Diagnosis not present

## 2023-01-10 DIAGNOSIS — K5900 Constipation, unspecified: Secondary | ICD-10-CM | POA: Diagnosis not present

## 2023-03-02 ENCOUNTER — Ambulatory Visit (INDEPENDENT_AMBULATORY_CARE_PROVIDER_SITE_OTHER): Payer: Medicare Other | Admitting: Podiatry

## 2023-03-02 ENCOUNTER — Encounter: Payer: Self-pay | Admitting: Podiatry

## 2023-03-02 DIAGNOSIS — L600 Ingrowing nail: Secondary | ICD-10-CM | POA: Diagnosis not present

## 2023-03-02 MED ORDER — NEOMYCIN-POLYMYXIN-HC 1 % OT SOLN: OTIC | 1 refills | Status: DC

## 2023-03-02 NOTE — Patient Instructions (Signed)

## 2023-03-02 NOTE — Progress Notes (Signed)
Subjective:  Patient ID: Don Howard, male    DOB: 06-Jun-1927,  MRN: [id number] HPI Chief Complaint  Patient presents with   Nail Problem    2nd and 5th left and 4th right - thick, dark toenails x years, would like those removed today, had other nails removed already   New Patient (Initial Visit)    89 y.o. male presents with the above complaint.   ROS: Denies fever chills nausea vomit muscle aches pains calf pain back pain chest pain shortness of breath.  Previous nails removed permanently by Dr. Ann Lions years ago.  States they did well and he would like to have these removed today  Past Medical History:  Diagnosis Date   Hypertension    Past Surgical History:  Procedure Laterality Date   APPENDECTOMY     TONSILLECTOMY      Current Outpatient Medications:    amLODipine (NORVASC) 10 MG tablet, Take 10 mg by mouth daily., Disp: , Rfl:    aspirin 81 MG EC tablet, Take 81 mg by mouth daily. Swallow whole., Disp: , Rfl:   No Known Allergies Review of Systems Objective:  There were no vitals filed for this visit.  General: Well developed, nourished, in no acute distress, alert and oriented x3   Dermatological: Skin is warm, dry and supple bilateral. remaining integument appears unremarkable at this time. There are no open sores, no preulcerative lesions, no rash or signs of infection present.  Painful ingrown mycotic nails to toes #4 right foot #2 left foot and #5 left foot.  These nails are thick yellow dystrophic with mycotic all the other nails are removed.  Vascular: Dorsalis Pedis artery and Posterior Tibial artery pedal pulses are 2/4 bilateral with immedate capillary fill time. Pedal hair growth present. No varicosities and no lower extremity edema present bilateral.   Neruologic: Grossly intact via light touch bilateral. Vibratory intact via tuning fork bilateral. Protective threshold with Semmes Wienstein monofilament intact to all pedal sites bilateral. Patellar and  Achilles deep tendon reflexes 2+ bilateral. No Babinski or clonus noted bilateral.   Musculoskeletal: No gross boney pedal deformities bilateral. No pain, crepitus, or limitation noted with foot and ankle range of motion bilateral. Muscular strength 5/5 in all groups tested bilateral.  Gait: Unassisted, Nonantalgic.    Radiographs:  None taken  Assessment & Plan:   Assessment: Painful ingrown mycotic nails fourth digit right foot second digit left foot fifth digit left foot  Plan: Chemical matricectomy's were performed today with phenol after local anesthetic was administered.  Tolerated procedure well without complications.  Was given verbal and written home-going instructions for the care and soaking of the toe.  Was also provided Cortisporin Otic to be applied twice daily after soaking.  He will follow-up with me in 2 weeks.     Curt Oatis T. [place], [place]

## 2023-03-06 DIAGNOSIS — H6123 Impacted cerumen, bilateral: Secondary | ICD-10-CM | POA: Diagnosis not present

## 2023-03-07 DIAGNOSIS — Z23 Encounter for immunization: Secondary | ICD-10-CM | POA: Diagnosis not present

## 2023-03-16 ENCOUNTER — Encounter: Payer: Self-pay | Admitting: Podiatry

## 2023-03-16 ENCOUNTER — Ambulatory Visit: Payer: Medicare Other | Admitting: Podiatry

## 2023-03-16 VITALS — BP 147/61 | HR 56 | Resp 18 | Ht 67.0 in | Wt 137.0 lb

## 2023-03-16 DIAGNOSIS — L600 Ingrowing nail: Secondary | ICD-10-CM

## 2023-03-16 NOTE — Progress Notes (Signed)
He presents today for follow-up of his nail check and matrixectomy's to the the second toe overlaps the fourth toe left and the second toe right.  Denies fever chills nausea vomit muscle aches pains states that they are doing great.  Objective: There is no erythema Dem salines drainage or odor.  No signs of infection.  Nontender.  Assessment well-healing surgical toes x 3 total.  Plan: Continue to soak once daily Epsom salts and warm water discontinue in a week or so.  Call me with any worsening of symptoms.

## 2023-03-21 ENCOUNTER — Encounter: Payer: Self-pay | Admitting: Cardiology

## 2023-03-21 ENCOUNTER — Ambulatory Visit: Payer: Medicare Other | Attending: Cardiology | Admitting: Cardiology

## 2023-03-21 VITALS — BP 126/60 | HR 60 | Ht 70.0 in | Wt 140.0 lb

## 2023-03-21 DIAGNOSIS — R5383 Other fatigue: Secondary | ICD-10-CM

## 2023-03-21 DIAGNOSIS — I10 Essential (primary) hypertension: Secondary | ICD-10-CM | POA: Diagnosis not present

## 2023-03-21 DIAGNOSIS — R001 Bradycardia, unspecified: Secondary | ICD-10-CM | POA: Diagnosis not present

## 2023-03-21 NOTE — Progress Notes (Unsigned)
Cardiology Office Note:   Date:  03/22/2023  ID:  Don Howard, DOB February 19, 1927, MRN [id number] PCP: Farris Has, MD  [facility] Providers Cardiologist:  Charlton Haws, MD    History of Present Illness:   Discussed the use of AI scribe software for clinical note transcription with the patient, who gave verbal consent to proceed.  History of Present Illness   Mr. Patella, a 89 year old with a history of hypertension, bradycardia, presented with persistent fatigue and weakness following a respiratory infection in February. The patient reported a significant decrease in his ability to tolerate yard work without needing to rest. TSH and kidney function were normal, and his hemoglobin was only slightly low at 12.8. An echocardiogram showed an ejection fraction of 60-65% with grade one diastolic dysfunction, mild to moderate mitral valve regurgitation, mild right atrial enlargement, and mild aortic valve regurgitation.  Patient presented today for follow up. He spent time discussing an episode of illness in February, which he initially suspected to be COVID-19 due to the severity of his symptoms. However, per patient, testing ruled out both influenza and COVID-19. He was eventually diagnosed with an upper respiratory infection caused by RSV and he suspects this was a cause of fatigue.  Since his last office visit with [facility], patient reports feeling much better and has been able to resume outdoor activities, albeit at a reduced capacity. He can now work for about two to three hours before needing to rest, a decrease from his previous "eight to ten hours" but improved from earlier this year. He has not experienced any recent chest pain, shortness of breath, or swelling in the legs.   He is considering getting the RSV vaccine and will discuss this with his primary care physician.      In summary, today patient denies chest pain, shortness of breath, lower extremity edema, worsening fatigue,  palpitations, melena, hematuria, hemoptysis, diaphoresis, weakness, presyncope, syncope, orthopnea, and PND.   Studies Reviewed:    10/13/22 TTE  IMPRESSIONS     1. Left ventricular ejection fraction, by estimation, is 60 to 65%. The  left ventricle has normal function. The left ventricle has no regional  wall motion abnormalities. Left ventricular diastolic parameters are  consistent with Grade I diastolic  dysfunction (impaired relaxation).   2. Right ventricular systolic function is mildly reduced. The right  ventricular size is moderately enlarged.   3. Right atrial size was mildly dilated.   4. The mitral valve is normal in structure. Mild to moderate mitral valve  regurgitation.   5. The aortic valve is tricuspid. Aortic valve regurgitation is mild.   6. Pulmonic valve regurgitation is moderate.   7. The inferior vena cava is normal in size with greater than 50%  respiratory variability, suggesting right atrial pressure of 3 mmHg.   Comparison(s): The left ventricular function is unchanged.   FINDINGS   Left Ventricle: Left ventricular ejection fraction, by estimation, is 60  to 65%. The left ventricle has normal function. The left ventricle has no  regional wall motion abnormalities. The left ventricular internal cavity  size was normal in size. There is   no left ventricular hypertrophy. Left ventricular diastolic parameters  are consistent with Grade I diastolic dysfunction (impaired relaxation).   Right Ventricle: The right ventricular size is moderately enlarged. Right  vetricular wall thickness was not assessed. Right ventricular systolic  function is mildly reduced.   Left Atrium: Left atrial size was normal in size.   Right Atrium: Right  atrial size was mildly dilated.   Pericardium: There is no evidence of pericardial effusion.   Mitral Valve: The mitral valve is normal in structure. Mild to moderate  mitral valve regurgitation.   Tricuspid Valve: The  tricuspid valve is normal in structure. Tricuspid  valve regurgitation is mild.   Aortic Valve: The aortic valve is tricuspid. Aortic valve regurgitation is  mild. Aortic regurgitation PHT measures 708 msec.   Pulmonic Valve: The pulmonic valve was grossly normal. Pulmonic valve  regurgitation is moderate.   Aorta: The aortic root and ascending aorta are structurally normal, with  no evidence of dilitation.   Venous: The inferior vena cava is normal in size with greater than 50%  respiratory variability, suggesting right atrial pressure of 3 mmHg.   IAS/Shunts: No atrial level shunt detected by color flow Doppler.    Risk Assessment/Calculations:              Physical Exam:   VS:  BP 126/60 (BP Location: Left Arm, Patient Position: Sitting, Cuff Size: Normal)   Pulse 60   Ht 5\' 10"  (1.778 m)   Wt 140 lb (63.5 kg)   SpO2 95%   BMI 20.09 kg/m    Wt Readings from Last 3 Encounters:  03/21/23 140 lb (63.5 kg)  03/16/23 137 lb (62.1 kg)  09/15/22 137 lb (62.1 kg)     Physical Exam Vitals reviewed.  Constitutional:      Appearance: Normal appearance.  HENT:     Head: Normocephalic.  Eyes:     Pupils: Pupils are equal, round, and reactive to light.  Cardiovascular:     Rate and Rhythm: Normal rate and regular rhythm.     Pulses: Normal pulses.     Heart sounds: Normal heart sounds.  Pulmonary:     Effort: Pulmonary effort is normal.     Breath sounds: Normal breath sounds.  Abdominal:     General: Abdomen is flat.     Palpations: Abdomen is soft.  Musculoskeletal:     Right lower leg: No edema.     Left lower leg: No edema.  Skin:    General: Skin is warm and dry.     Capillary Refill: Capillary refill takes less than 2 seconds.  Neurological:     General: No focal deficit present.     Mental Status: He is alert and oriented to person, place, and time.  Psychiatric:        Mood and Affect: Mood normal.        Behavior: Behavior normal.        Thought  Content: Thought content normal.        Judgment: Judgment normal.     Physical Exam   VITALS: BP- 126/60 CARDIOVASCULAR: Heart sounds normal upon auscultation.       ASSESSMENT AND PLAN:     Assessment and Plan    Post-infectious fatigue Improved since last visit. No new symptoms and appears that patient is remarkably active for his age. Labs and echocardiogram in April/May were reassuring. -No further testing needed at this time.  Bradycardia No symptoms to suggest that patient has significant bradycardia. Stable rates on exam today. -Continue to monitor.   Hypertension Well controlled on Amlodipine 10mg  daily. Blood pressure today 126/60. -Continue Amlodipine 10mg  daily.  General Health Maintenance Patient inquired about RSV vaccine. -Advised to discuss RSV vaccine with primary care physician, Dr. Kateri Plummer.  Follow-up in 1 year with Dr. Eden Emms, unless new symptoms arise.  Signed, Perlie Gold, PA-C

## 2023-03-21 NOTE — Patient Instructions (Signed)
Medication Instructions:   Your physician recommends that you continue on your current medications as directed. Please refer to the Current Medication list given to you today.   *If you need a refill on your cardiac medications before your next appointment, please call your pharmacy*   Lab Work:  None ordered.  If you have labs (blood work) drawn today and your tests are completely normal, you will receive your results only by: MyChart Message (if you have MyChart) OR A paper copy in the mail If you have any lab test that is abnormal or we need to change your treatment, we will call you to review the results.   Testing/Procedures:  None ordered.   Follow-Up: At [facility], you and your health needs are our priority.  As part of our continuing mission to provide you with exceptional heart care, we have created designated Provider Care Teams.  These Care Teams include your primary Cardiologist (physician) and Advanced Practice Providers (APPs -  Physician Assistants and Nurse Practitioners) who all work together to provide you with the care you need, when you need it.  We recommend signing up for the patient portal called "MyChart".  Sign up information is provided on this After Visit Summary.  MyChart is used to connect with patients for Virtual Visits (Telemedicine).  Patients are able to view lab/test results, encounter notes, upcoming appointments, etc.  Non-urgent messages can be sent to your provider as well.   To learn more about what you can do with MyChart, go to [email or web address].    Your next appointment:   1 year(s)  Provider:   Charlton Haws, MD     Other Instructions  Your physician wants you to follow-up in: 1 year.  You will receive a reminder letter in the mail two months in advance. If you don't receive a letter, please call our office to schedule the follow-up appointment.

## 2023-04-05 ENCOUNTER — Ambulatory Visit: Payer: Medicare Other | Admitting: Cardiology

## 2023-07-14 DIAGNOSIS — Z0000 Encounter for general adult medical examination without abnormal findings: Secondary | ICD-10-CM | POA: Diagnosis not present

## 2023-07-14 DIAGNOSIS — I10 Essential (primary) hypertension: Secondary | ICD-10-CM | POA: Diagnosis not present

## 2023-07-14 DIAGNOSIS — K5900 Constipation, unspecified: Secondary | ICD-10-CM | POA: Diagnosis not present

## 2023-07-14 DIAGNOSIS — E559 Vitamin D deficiency, unspecified: Secondary | ICD-10-CM | POA: Diagnosis not present

## 2023-07-14 IMAGING — CT CT ABD-PELV W/ CM
2 of 5 series · 16 of 46 positions shown, 18 images · IV contrast (APPLIED)
Comparison: CT abdomen and pelvis 01/20/2021.

CLINICAL DATA: Left lower quadrant abdominal pain.

EXAM:
CT ABDOMEN AND PELVIS WITH CONTRAST
TECHNIQUE: Multidetector CT imaging of the abdomen and pelvis was performed
using the standard protocol following bolus administration of
intravenous contrast.
CONTRAST:  60mL OMNIPAQUE IOHEXOL 350 MG/ML SOLN

[Series 2: abd pel w · axial · 0.73mm/px · z∈[+715,+1090]mm · 13 of 85 slices shown, 15 images]
[im 5/85  soft-tissue]
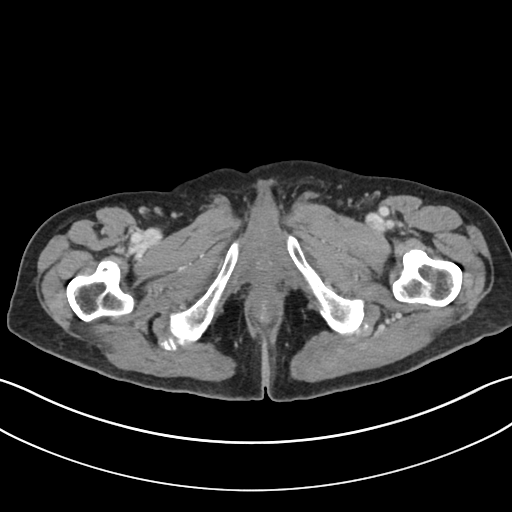
[im 5/85  bone]
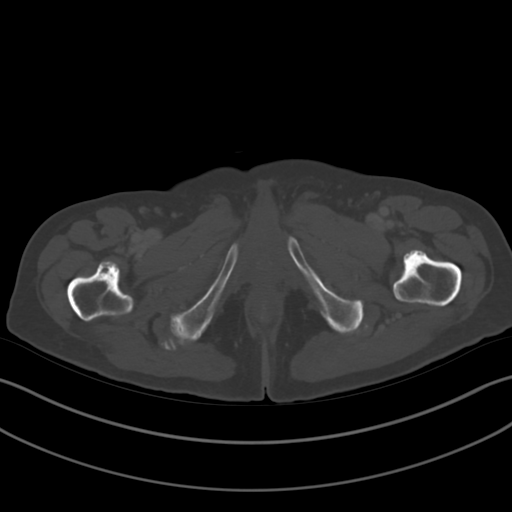
[im 14/85  soft-tissue]
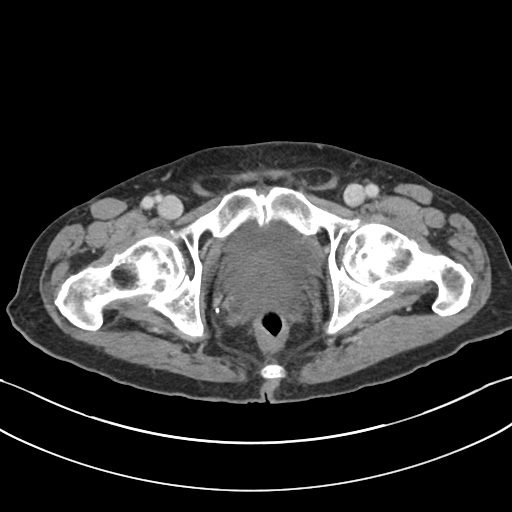
[im 18/85  soft-tissue]
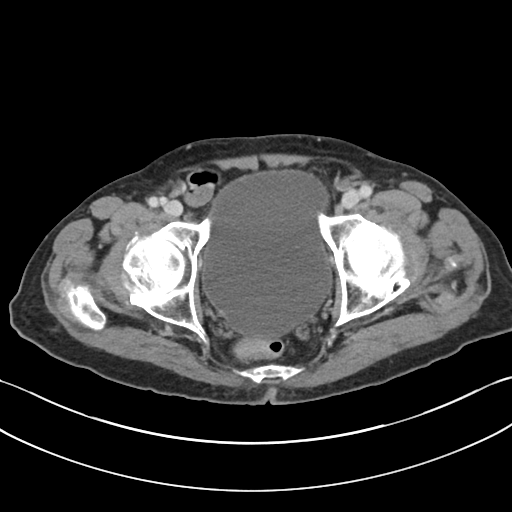
[im 23/85  soft-tissue]
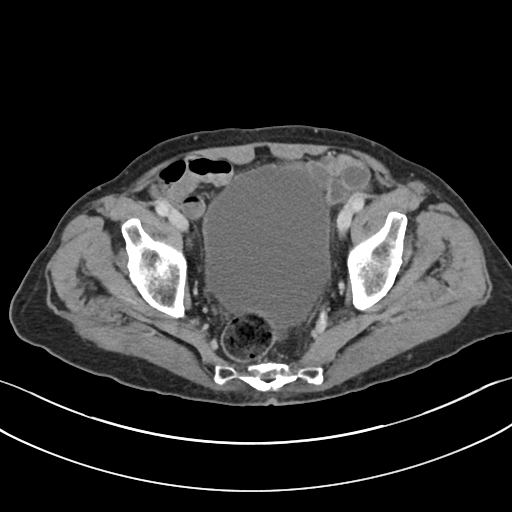
[im 31/85  soft-tissue]
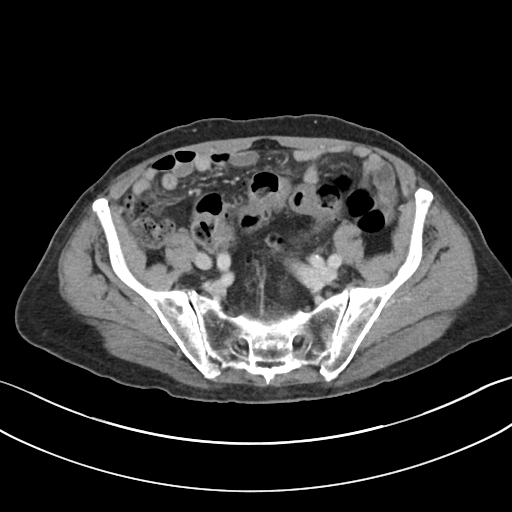
[im 36/85  soft-tissue]
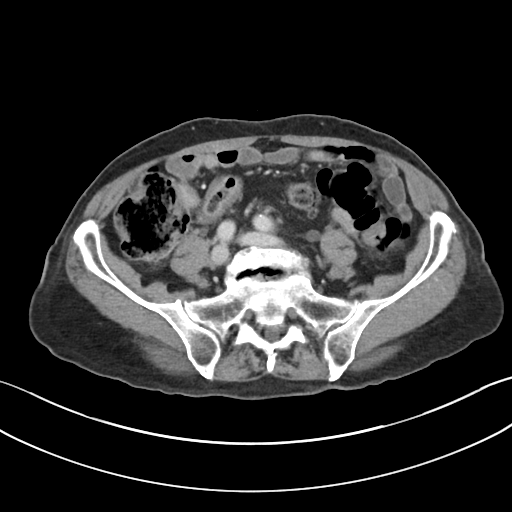
[im 45/85  soft-tissue]
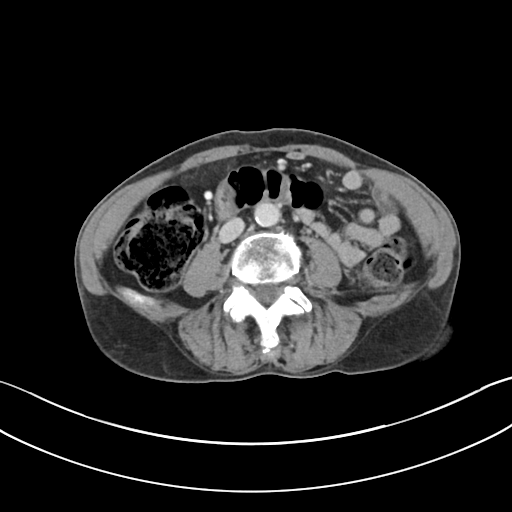
[im 49/85  soft-tissue]
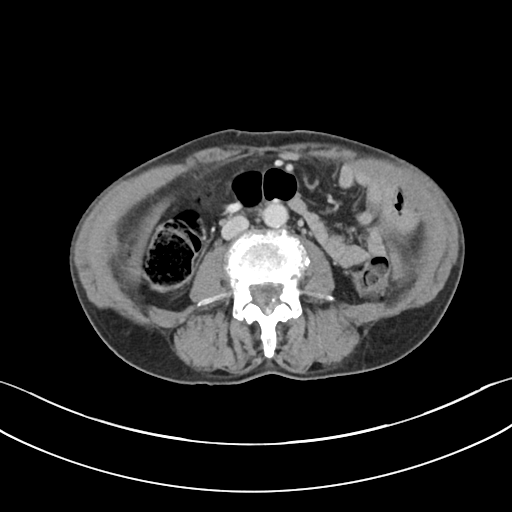
[im 54/85  soft-tissue]
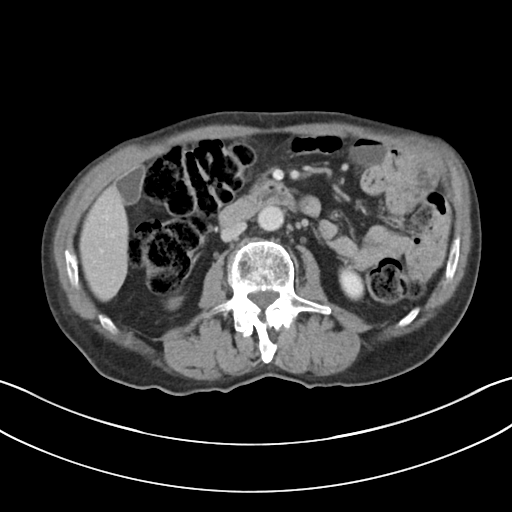
[im 54/85  bone]
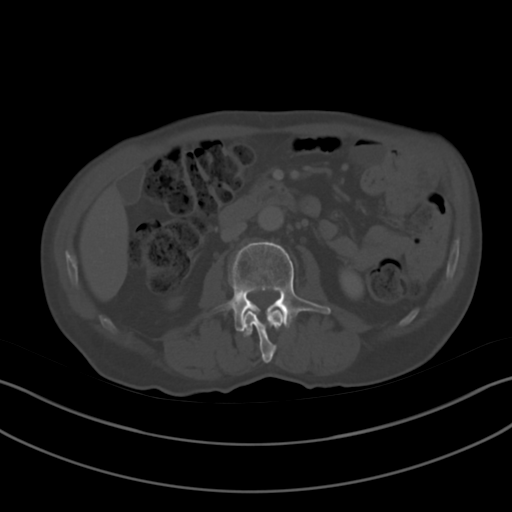
[im 62/85  soft-tissue]
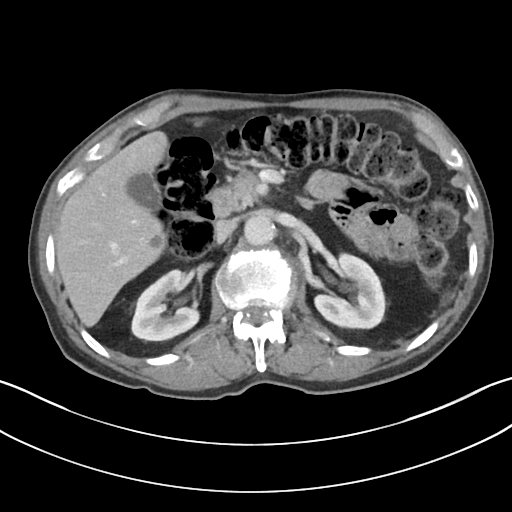
[im 67/85  soft-tissue]
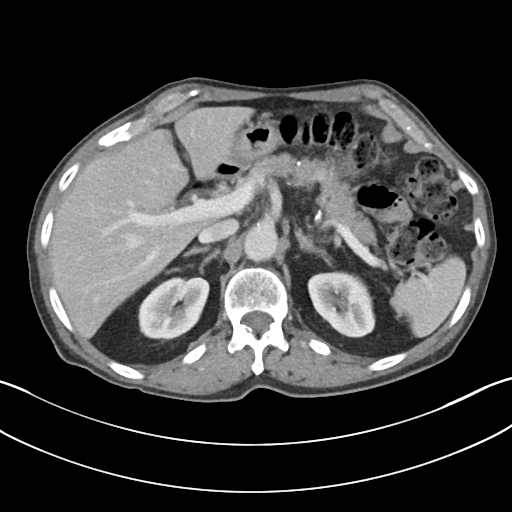
[im 71/85  soft-tissue]
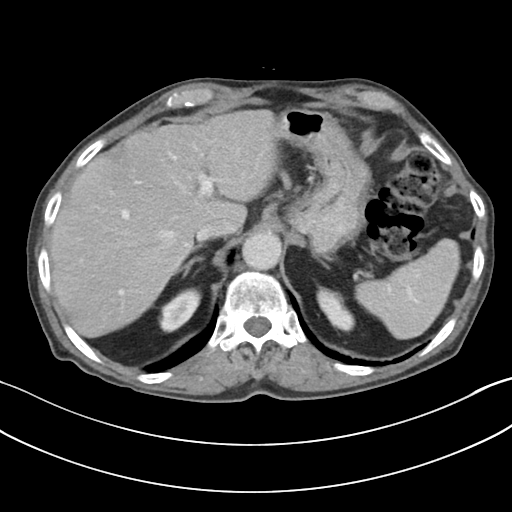
[im 80/85  soft-tissue]
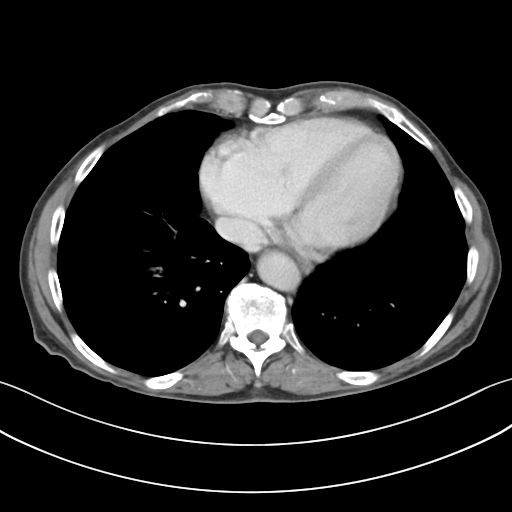

[Series 5: coronal · coronal · 0.76mm/px · 3 of 82 slices shown]
[im 28/82  soft-tissue]
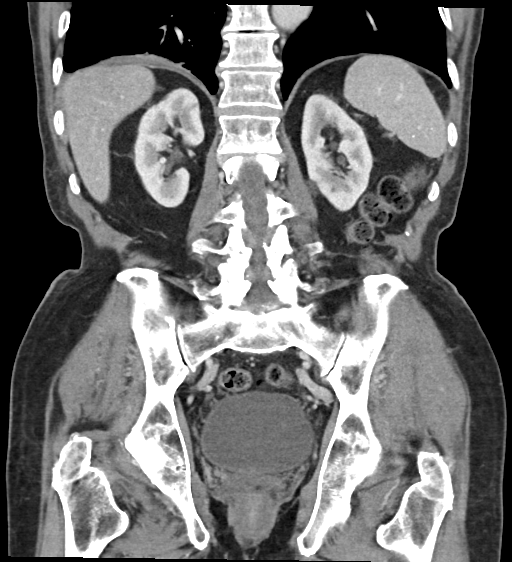
[im 37/82  soft-tissue]
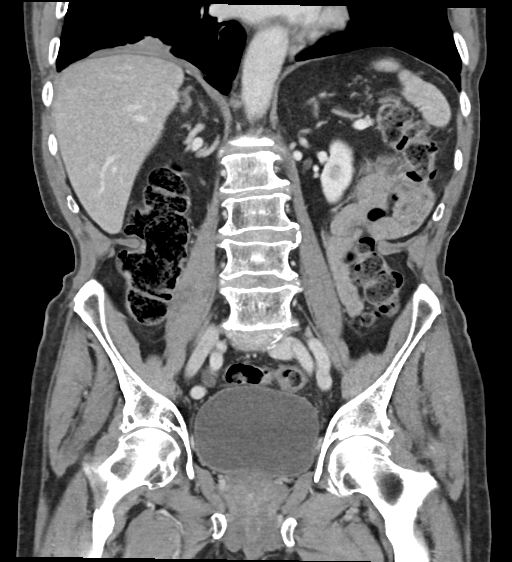
[im 46/82  soft-tissue]
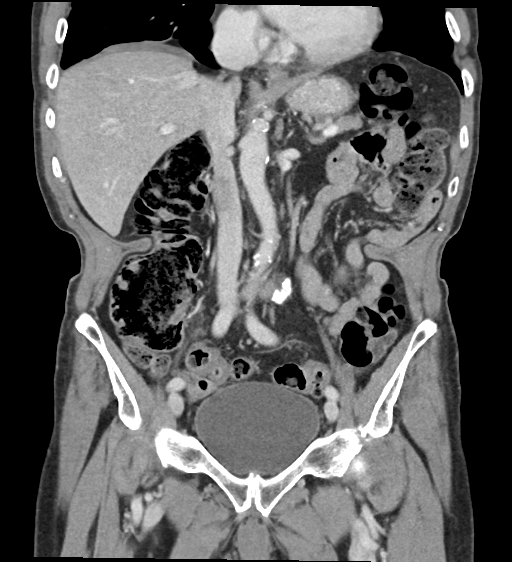

[16 of 46 positions shown; findings below may reference images not displayed]

FINDINGS: Lower chest: No acute abnormality.

Hepatobiliary: There is a rounded hypodensity in the liver which is
too small to characterize and unchanged, likely a cyst. The liver
otherwise appears within normal limits. Gallbladder and bile ducts
are within normal limits.

Pancreas: Unremarkable. No pancreatic ductal dilatation or
surrounding inflammatory changes.

Spleen: There is a calcified granuloma in the spleen. The spleen is
otherwise within normal limits.

Adrenals/Urinary Tract: There are bilateral renal cysts and
hypodensities which are too small to characterize, unchanged from
the prior examination. The kidneys, adrenal glands and bladder are
otherwise within normal limits.

Stomach/Bowel: Stomach is within normal limits. Appendix is not
visualized. No evidence of bowel wall thickening, distention, or
inflammatory changes. There is sigmoid colon diverticulosis without
evidence for diverticulitis. There is moderate to large stool
burden.

Vascular/Lymphatic: Aortic atherosclerosis. No enlarged abdominal or
pelvic lymph nodes.

Reproductive: Prostate is unremarkable.

Other: No abdominal wall hernia or abnormality. No abdominopelvic
ascites.

Musculoskeletal: Multilevel degenerative changes affect the spine.
There also degenerative changes of both hips.
IMPRESSION: 1. No acute localizing process in the abdomen or pelvis.
2. Sigmoid colon diverticulosis without evidence for acute
diverticulitis.

## 2023-09-07 DIAGNOSIS — H6123 Impacted cerumen, bilateral: Secondary | ICD-10-CM | POA: Diagnosis not present

## 2023-10-03 DIAGNOSIS — H9193 Unspecified hearing loss, bilateral: Secondary | ICD-10-CM | POA: Diagnosis not present

## 2023-10-03 NOTE — Progress Notes (Signed)
 Otolaryngology Clinic Note  HPI:    Chief Complaint  Patient presents with  . Hearing Loss  . Ear Problem    Patient states both ears feel stopped up and not able to hear, patient states sx are much better, just wants to have ears checked   Don Howard is a 89 y.o. male who presents as an established patient for hearing concerns. Patient was last seen for routine ear cleaning on 09/07/23. Normal ear exam post cerumen debridement. He is a former patient of Dr. Fortino. He has had the hearing checked in the past but not recently (no prior audiogram for review). Since he was last seen three weeks ago, one week ago he reports getting up and not being able to hear. He started his vehicle and could barely hear the motor running. This seemed to affect both ears and he denies associated tinnitus, dizziness, nausea, vomiting or headache. He denies associated illness or trauma. The hearing has improved over the past week but not back to baseline. He is scheduled for an audiogram with hearing aid consultation on May 9th.   No PMH of diabetes.   PMH/Meds/All/SocHx/FamHx/ROS:   Past Medical History:  Diagnosis Date  . Constipation   . Hypertension     Past Surgical History:  Procedure Laterality Date  . COLONOSCOPY     Procedure: COLONOSCOPY  . TONSILLECTOMY     Procedure: TONSILLECTOMY    No family history of bleeding disorders, wound healing problems or difficulty with anesthesia.       Current Outpatient Medications:  .  amLODIPine (NORVASC) 10 mg tablet, Take 1 tablet by mouth once daily for 90 days, Disp: , Rfl:  .  aspirin  81 mg EC tablet, Take 81 mg by mouth., Disp: , Rfl:  .  ibuprofen  (MOTRIN ) 200 mg tablet, Take 1 tablet by mouth in the morning and 1 tablet at noon and 1 tablet in the evening. Take with meals., Disp: , Rfl:  .  neomycin -polymyxin-HC (CORTISPORIN) 3.5-10,000-1 mg/mL-unit/mL-% otic solution, Apply 1-2 drops to toe BID after soaking, Disp: , Rfl:  .  psyllium husk  (METAMUCIL) 0.4 gram cap capsule, 1 scoop in water, Disp: , Rfl:   A complete ROS was performed with pertinent positives/negatives noted in the HPI. The remainder of the ROS are negative.    Physical Exam:    BP (!) 167/70   Pulse (!) 44   Temp 96.5 F (35.8 C) (Temporal)   Resp 16   Ht 1.778 m (5' 10)   Wt 68 kg (150 lb)   BMI 21.52 kg/m    General Awake, at baseline alertness during examination.  Eyes No scleral icterus or conjunctival hemorrhage. Globe position appears normal. EOMI.   Right Ear EAC patent, TM intact w/o inflammation. Middle ear well aerated.   Left Ear EAC patent, TM intact w/o inflammation. Middle ear well aerated.   Weber is symmetric. AC>BC for both ears (512 Hz tuning fork).   Nose Patent, no polyps or masses seen on anterior rhinoscopy.  Oral cavity No mucosal lesions or tumors seen. Tongue midline.   Oropharynx Tonsils absent.   Neck No abnormal cervical lymphadenopathy. No thyromegaly. No thyroid  masses palpated.   Independent Review of Additional Tests or Records:  Medical records.   Procedures:  None  Impression & Plans:  Don Howard is a 89 y.o. male with 7-10 day history of new change in hearing (bilaterally). Normal ear exam today and normal tuning fork exam. He is scheduled for a  sooner audiogram early next week. We will contact him with those results. Patient agrees with the plan.  Velia Pry, PA-C [facility]

## 2023-10-09 DIAGNOSIS — H903 Sensorineural hearing loss, bilateral: Secondary | ICD-10-CM | POA: Diagnosis not present

## 2023-10-11 DIAGNOSIS — H52223 Regular astigmatism, bilateral: Secondary | ICD-10-CM | POA: Diagnosis not present

## 2023-10-11 DIAGNOSIS — H5203 Hypermetropia, bilateral: Secondary | ICD-10-CM | POA: Diagnosis not present

## 2023-10-11 DIAGNOSIS — H524 Presbyopia: Secondary | ICD-10-CM | POA: Diagnosis not present

## 2023-11-01 DIAGNOSIS — H43813 Vitreous degeneration, bilateral: Secondary | ICD-10-CM | POA: Diagnosis not present

## 2023-11-01 DIAGNOSIS — H2513 Age-related nuclear cataract, bilateral: Secondary | ICD-10-CM | POA: Diagnosis not present

## 2023-11-01 DIAGNOSIS — H25013 Cortical age-related cataract, bilateral: Secondary | ICD-10-CM | POA: Diagnosis not present

## 2023-11-01 DIAGNOSIS — H40023 Open angle with borderline findings, high risk, bilateral: Secondary | ICD-10-CM | POA: Diagnosis not present

## 2023-11-20 DIAGNOSIS — H2511 Age-related nuclear cataract, right eye: Secondary | ICD-10-CM | POA: Diagnosis not present

## 2023-11-20 DIAGNOSIS — H25011 Cortical age-related cataract, right eye: Secondary | ICD-10-CM | POA: Diagnosis not present

## 2023-11-20 DIAGNOSIS — H25811 Combined forms of age-related cataract, right eye: Secondary | ICD-10-CM | POA: Diagnosis not present

## 2023-11-20 DIAGNOSIS — H52221 Regular astigmatism, right eye: Secondary | ICD-10-CM | POA: Diagnosis not present

## 2023-12-13 NOTE — Progress Notes (Signed)
 [facility]   Patient Name: Don Howard   Patient DOB: 07-May-1927                Patient Age: 89 y.o.     Reason for Visit: Don Howard is here at my request for follow-up of bilateral hearing loss.   He has two [employer] Advanced hearing aids that were recently fit, and is here for related follow-up care.  Procedure: Today, Don Howard reports he was doing well with his hearing aids until the end of June when the right hearing aid suddenly quit working. Wax guard was clogged in both aids, which we did not discuss at his fitting appointment due to high learning curve. Retention lock also added on the left ear.    All ancillary questions answered.   Follow-up: 1 month   Billing: Today's hearing aid services related to programming are no charge as Don Howard is still in their hearing aid trial period.

## 2024-01-01 DIAGNOSIS — H25012 Cortical age-related cataract, left eye: Secondary | ICD-10-CM | POA: Diagnosis not present

## 2024-01-01 DIAGNOSIS — H52222 Regular astigmatism, left eye: Secondary | ICD-10-CM | POA: Diagnosis not present

## 2024-01-01 DIAGNOSIS — H2512 Age-related nuclear cataract, left eye: Secondary | ICD-10-CM | POA: Diagnosis not present

## 2024-01-01 DIAGNOSIS — H25812 Combined forms of age-related cataract, left eye: Secondary | ICD-10-CM | POA: Diagnosis not present

## 2024-01-08 DIAGNOSIS — R1010 Upper abdominal pain, unspecified: Secondary | ICD-10-CM | POA: Diagnosis not present

## 2024-01-11 ENCOUNTER — Emergency Department (HOSPITAL_COMMUNITY)

## 2024-01-11 ENCOUNTER — Other Ambulatory Visit: Payer: Self-pay

## 2024-01-11 ENCOUNTER — Inpatient Hospital Stay (HOSPITAL_COMMUNITY)

## 2024-01-11 ENCOUNTER — Inpatient Hospital Stay (HOSPITAL_COMMUNITY)
Admission: EM | Admit: 2024-01-11 | Discharge: 2024-01-17 | DRG: 175 | Disposition: A | Discharge status: Rehabilitation Facility | Attending: Family Medicine | Admitting: Family Medicine

## 2024-01-11 DIAGNOSIS — I468 Cardiac arrest due to other underlying condition: Secondary | ICD-10-CM | POA: Diagnosis present

## 2024-01-11 DIAGNOSIS — A419 Sepsis, unspecified organism: Principal | ICD-10-CM

## 2024-01-11 DIAGNOSIS — R29818 Other symptoms and signs involving the nervous system: Secondary | ICD-10-CM | POA: Diagnosis not present

## 2024-01-11 DIAGNOSIS — R570 Cardiogenic shock: Secondary | ICD-10-CM | POA: Diagnosis present

## 2024-01-11 DIAGNOSIS — R739 Hyperglycemia, unspecified: Secondary | ICD-10-CM | POA: Diagnosis not present

## 2024-01-11 DIAGNOSIS — E871 Hypo-osmolality and hyponatremia: Secondary | ICD-10-CM | POA: Diagnosis not present

## 2024-01-11 DIAGNOSIS — J9601 Acute respiratory failure with hypoxia: Secondary | ICD-10-CM | POA: Diagnosis present

## 2024-01-11 DIAGNOSIS — Z800 Family history of malignant neoplasm of digestive organs: Secondary | ICD-10-CM | POA: Diagnosis not present

## 2024-01-11 DIAGNOSIS — R9082 White matter disease, unspecified: Secondary | ICD-10-CM

## 2024-01-11 DIAGNOSIS — I10 Essential (primary) hypertension: Secondary | ICD-10-CM | POA: Diagnosis not present

## 2024-01-11 DIAGNOSIS — Z8249 Family history of ischemic heart disease and other diseases of the circulatory system: Secondary | ICD-10-CM | POA: Diagnosis not present

## 2024-01-11 DIAGNOSIS — R918 Other nonspecific abnormal finding of lung field: Secondary | ICD-10-CM | POA: Diagnosis not present

## 2024-01-11 DIAGNOSIS — Z66 Do not resuscitate: Secondary | ICD-10-CM | POA: Diagnosis not present

## 2024-01-11 DIAGNOSIS — I6523 Occlusion and stenosis of bilateral carotid arteries: Secondary | ICD-10-CM | POA: Diagnosis not present

## 2024-01-11 DIAGNOSIS — K8020 Calculus of gallbladder without cholecystitis without obstruction: Secondary | ICD-10-CM | POA: Diagnosis not present

## 2024-01-11 DIAGNOSIS — J90 Pleural effusion, not elsewhere classified: Secondary | ICD-10-CM | POA: Diagnosis not present

## 2024-01-11 DIAGNOSIS — I2510 Atherosclerotic heart disease of native coronary artery without angina pectoris: Secondary | ICD-10-CM | POA: Diagnosis present

## 2024-01-11 DIAGNOSIS — N400 Enlarged prostate without lower urinary tract symptoms: Secondary | ICD-10-CM | POA: Diagnosis not present

## 2024-01-11 DIAGNOSIS — Z7982 Long term (current) use of aspirin: Secondary | ICD-10-CM | POA: Diagnosis not present

## 2024-01-11 DIAGNOSIS — I959 Hypotension, unspecified: Secondary | ICD-10-CM

## 2024-01-11 DIAGNOSIS — I493 Ventricular premature depolarization: Secondary | ICD-10-CM | POA: Diagnosis present

## 2024-01-11 DIAGNOSIS — R32 Unspecified urinary incontinence: Secondary | ICD-10-CM | POA: Diagnosis present

## 2024-01-11 DIAGNOSIS — Z7409 Other reduced mobility: Secondary | ICD-10-CM | POA: Diagnosis present

## 2024-01-11 DIAGNOSIS — R531 Weakness: Secondary | ICD-10-CM | POA: Diagnosis not present

## 2024-01-11 DIAGNOSIS — I2699 Other pulmonary embolism without acute cor pulmonale: Secondary | ICD-10-CM | POA: Diagnosis not present

## 2024-01-11 DIAGNOSIS — I69398 Other sequelae of cerebral infarction: Secondary | ICD-10-CM | POA: Diagnosis not present

## 2024-01-11 DIAGNOSIS — Z1152 Encounter for screening for COVID-19: Secondary | ICD-10-CM | POA: Diagnosis not present

## 2024-01-11 DIAGNOSIS — I469 Cardiac arrest, cause unspecified: Secondary | ICD-10-CM | POA: Diagnosis present

## 2024-01-11 DIAGNOSIS — E860 Dehydration: Secondary | ICD-10-CM | POA: Diagnosis present

## 2024-01-11 DIAGNOSIS — I4510 Unspecified right bundle-branch block: Secondary | ICD-10-CM | POA: Diagnosis not present

## 2024-01-11 DIAGNOSIS — G8929 Other chronic pain: Secondary | ICD-10-CM | POA: Diagnosis not present

## 2024-01-11 DIAGNOSIS — R509 Fever, unspecified: Secondary | ICD-10-CM | POA: Diagnosis not present

## 2024-01-11 DIAGNOSIS — G459 Transient cerebral ischemic attack, unspecified: Secondary | ICD-10-CM

## 2024-01-11 DIAGNOSIS — R578 Other shock: Secondary | ICD-10-CM | POA: Diagnosis not present

## 2024-01-11 DIAGNOSIS — I639 Cerebral infarction, unspecified: Secondary | ICD-10-CM | POA: Diagnosis not present

## 2024-01-11 DIAGNOSIS — D649 Anemia, unspecified: Secondary | ICD-10-CM | POA: Diagnosis not present

## 2024-01-11 DIAGNOSIS — R5381 Other malaise: Secondary | ICD-10-CM | POA: Diagnosis not present

## 2024-01-11 DIAGNOSIS — R4701 Aphasia: Secondary | ICD-10-CM | POA: Diagnosis not present

## 2024-01-11 DIAGNOSIS — Z79899 Other long term (current) drug therapy: Secondary | ICD-10-CM

## 2024-01-11 DIAGNOSIS — R4182 Altered mental status, unspecified: Secondary | ICD-10-CM | POA: Diagnosis not present

## 2024-01-11 DIAGNOSIS — Z86711 Personal history of pulmonary embolism: Secondary | ICD-10-CM | POA: Diagnosis not present

## 2024-01-11 DIAGNOSIS — T2002XA Burn of unspecified degree of lip(s), initial encounter: Secondary | ICD-10-CM | POA: Diagnosis not present

## 2024-01-11 DIAGNOSIS — N179 Acute kidney failure, unspecified: Secondary | ICD-10-CM | POA: Diagnosis present

## 2024-01-11 DIAGNOSIS — G928 Other toxic encephalopathy: Secondary | ICD-10-CM | POA: Diagnosis not present

## 2024-01-11 DIAGNOSIS — H9193 Unspecified hearing loss, bilateral: Secondary | ICD-10-CM | POA: Diagnosis not present

## 2024-01-11 DIAGNOSIS — G4700 Insomnia, unspecified: Secondary | ICD-10-CM | POA: Diagnosis not present

## 2024-01-11 DIAGNOSIS — R569 Unspecified convulsions: Secondary | ICD-10-CM

## 2024-01-11 DIAGNOSIS — E872 Acidosis, unspecified: Secondary | ICD-10-CM | POA: Diagnosis present

## 2024-01-11 DIAGNOSIS — R0989 Other specified symptoms and signs involving the circulatory and respiratory systems: Secondary | ICD-10-CM | POA: Diagnosis not present

## 2024-01-11 DIAGNOSIS — I700 Atherosclerosis of aorta: Secondary | ICD-10-CM | POA: Diagnosis not present

## 2024-01-11 DIAGNOSIS — Z8674 Personal history of sudden cardiac arrest: Secondary | ICD-10-CM | POA: Diagnosis not present

## 2024-01-11 DIAGNOSIS — I2609 Other pulmonary embolism with acute cor pulmonale: Secondary | ICD-10-CM | POA: Diagnosis not present

## 2024-01-11 DIAGNOSIS — E861 Hypovolemia: Secondary | ICD-10-CM | POA: Diagnosis present

## 2024-01-11 DIAGNOSIS — R7401 Elevation of levels of liver transaminase levels: Secondary | ICD-10-CM

## 2024-01-11 DIAGNOSIS — M1711 Unilateral primary osteoarthritis, right knee: Secondary | ICD-10-CM | POA: Diagnosis not present

## 2024-01-11 DIAGNOSIS — R579 Shock, unspecified: Secondary | ICD-10-CM | POA: Diagnosis not present

## 2024-01-11 DIAGNOSIS — M25561 Pain in right knee: Secondary | ICD-10-CM | POA: Diagnosis not present

## 2024-01-11 DIAGNOSIS — Z7901 Long term (current) use of anticoagulants: Secondary | ICD-10-CM | POA: Diagnosis not present

## 2024-01-11 DIAGNOSIS — I4891 Unspecified atrial fibrillation: Secondary | ICD-10-CM | POA: Diagnosis not present

## 2024-01-11 DIAGNOSIS — I499 Cardiac arrhythmia, unspecified: Secondary | ICD-10-CM | POA: Diagnosis not present

## 2024-01-11 LAB — I-STAT CHEM 8, ED
BUN: 29 mg/dL — ABNORMAL HIGH (ref 8–23)
Calcium, Ion: 1.08 mmol/L — ABNORMAL LOW (ref 1.15–1.40)
Chloride: 100 mmol/L (ref 98–111)
Creatinine, Ser: 1.5 mg/dL — ABNORMAL HIGH (ref 0.61–1.24)
Glucose, Bld: 155 mg/dL — ABNORMAL HIGH (ref 70–99)
HCT: 40 % (ref 39.0–52.0)
Hemoglobin: 13.6 g/dL (ref 13.0–17.0)
Potassium: 3.8 mmol/L (ref 3.5–5.1)
Sodium: 134 mmol/L — ABNORMAL LOW (ref 135–145)
TCO2: 21 mmol/L — ABNORMAL LOW (ref 22–32)

## 2024-01-11 LAB — URINALYSIS, W/ REFLEX TO CULTURE (INFECTION SUSPECTED)
Bilirubin Urine: NEGATIVE
Glucose, UA: 50 mg/dL — AB
Ketones, ur: NEGATIVE mg/dL
Leukocytes,Ua: NEGATIVE
Nitrite: NEGATIVE
Protein, ur: >=300 mg/dL — AB
Specific Gravity, Urine: 1.018 (ref 1.005–1.030)
pH: 5 (ref 5.0–8.0)

## 2024-01-11 LAB — COMPREHENSIVE METABOLIC PANEL WITH GFR
ALT: 157 U/L — ABNORMAL HIGH (ref 0–44)
AST: 219 U/L — ABNORMAL HIGH (ref 15–41)
Albumin: 2.9 g/dL — ABNORMAL LOW (ref 3.5–5.0)
Alkaline Phosphatase: 50 U/L (ref 38–126)
Anion gap: 15 (ref 5–15)
BUN: 28 mg/dL — ABNORMAL HIGH (ref 8–23)
CO2: 18 mmol/L — ABNORMAL LOW (ref 22–32)
Calcium: 7.9 mg/dL — ABNORMAL LOW (ref 8.9–10.3)
Chloride: 99 mmol/L (ref 98–111)
Creatinine, Ser: 1.69 mg/dL — ABNORMAL HIGH (ref 0.61–1.24)
GFR, Estimated: 37 mL/min — ABNORMAL LOW
Glucose, Bld: 190 mg/dL — ABNORMAL HIGH (ref 70–99)
Potassium: 3.5 mmol/L (ref 3.5–5.1)
Sodium: 132 mmol/L — ABNORMAL LOW (ref 135–145)
Total Bilirubin: 1.8 mg/dL — ABNORMAL HIGH (ref 0.0–1.2)
Total Protein: 5.5 g/dL — ABNORMAL LOW (ref 6.5–8.1)

## 2024-01-11 LAB — CBG MONITORING, ED: Glucose-Capillary: 200 mg/dL — ABNORMAL HIGH (ref 70–99)

## 2024-01-11 LAB — RAPID URINE DRUG SCREEN, HOSP PERFORMED
Amphetamines: NOT DETECTED
Barbiturates: NOT DETECTED
Benzodiazepines: NOT DETECTED
Cocaine: NOT DETECTED
Opiates: NOT DETECTED
Tetrahydrocannabinol: NOT DETECTED

## 2024-01-11 LAB — CBC WITH DIFFERENTIAL/PLATELET
Abs Immature Granulocytes: 0.17 K/uL — ABNORMAL HIGH (ref 0.00–0.07)
Basophils Absolute: 0.1 K/uL (ref 0.0–0.1)
Basophils Relative: 0 %
Eosinophils Absolute: 0 K/uL (ref 0.0–0.5)
Eosinophils Relative: 0 %
HCT: 36.9 % — ABNORMAL LOW (ref 39.0–52.0)
Hemoglobin: 12.2 g/dL — ABNORMAL LOW (ref 13.0–17.0)
Immature Granulocytes: 1 %
Lymphocytes Relative: 9 %
Lymphs Abs: 2 K/uL (ref 0.7–4.0)
MCH: 31.6 pg (ref 26.0–34.0)
MCHC: 33.1 g/dL (ref 30.0–36.0)
MCV: 95.6 fL (ref 80.0–100.0)
Monocytes Absolute: 1.4 K/uL — ABNORMAL HIGH (ref 0.1–1.0)
Monocytes Relative: 6 %
Neutro Abs: 18.4 K/uL — ABNORMAL HIGH (ref 1.7–7.7)
Neutrophils Relative %: 84 %
Platelets: 203 K/uL (ref 150–400)
RBC: 3.86 MIL/uL — ABNORMAL LOW (ref 4.22–5.81)
RDW: 12.5 % (ref 11.5–15.5)
WBC: 22.1 K/uL — ABNORMAL HIGH (ref 4.0–10.5)
nRBC: 0 % (ref 0.0–0.2)

## 2024-01-11 LAB — CBC
HCT: 35.4 % — ABNORMAL LOW (ref 39.0–52.0)
Hemoglobin: 11.8 g/dL — ABNORMAL LOW (ref 13.0–17.0)
MCH: 31.6 pg (ref 26.0–34.0)
MCHC: 33.3 g/dL (ref 30.0–36.0)
MCV: 94.7 fL (ref 80.0–100.0)
Platelets: 196 K/uL (ref 150–400)
RBC: 3.74 MIL/uL — ABNORMAL LOW (ref 4.22–5.81)
RDW: 12.6 % (ref 11.5–15.5)
WBC: 13.9 K/uL — ABNORMAL HIGH (ref 4.0–10.5)
nRBC: 0 % (ref 0.0–0.2)

## 2024-01-11 LAB — TROPONIN I (HIGH SENSITIVITY)
Troponin I (High Sensitivity): 246 ng/L (ref <18)
Troponin I (High Sensitivity): 1539 ng/L (ref <18)

## 2024-01-11 LAB — ECHOCARDIOGRAM COMPLETE
AR max vel: 2.76 cm2
AV Area VTI: 2.7 cm2
AV Area mean vel: 2.09 cm2
AV Mean grad: 2 mmHg
AV Peak grad: 3.5 mmHg
Ao pk vel: 0.93 m/s
S' Lateral: 2.9 cm
Weight: 2222.24 [oz_av]

## 2024-01-11 LAB — APTT
aPTT: 23 s — ABNORMAL LOW (ref 24–36)
aPTT: 38 s — ABNORMAL HIGH (ref 24–36)

## 2024-01-11 LAB — PROTIME-INR
INR: 1.2 (ref 0.8–1.2)
INR: 1.2 (ref 0.8–1.2)
Prothrombin Time: 15.6 s — ABNORMAL HIGH (ref 11.4–15.2)
Prothrombin Time: 15.7 s — ABNORMAL HIGH (ref 11.4–15.2)

## 2024-01-11 LAB — LACTIC ACID, PLASMA
Lactic Acid, Venous: 3.1 mmol/L (ref 0.5–1.9)
Lactic Acid, Venous: 3 mmol/L (ref 0.5–1.9)

## 2024-01-11 LAB — RESP PANEL BY RT-PCR (RSV, FLU A&B, COVID)  RVPGX2
Influenza A by PCR: NEGATIVE
Influenza B by PCR: NEGATIVE
Resp Syncytial Virus by PCR: NEGATIVE
SARS Coronavirus 2 by RT PCR: NEGATIVE

## 2024-01-11 LAB — GLUCOSE, CAPILLARY: Glucose-Capillary: 158 mg/dL — ABNORMAL HIGH (ref 70–99)

## 2024-01-11 LAB — I-STAT CG4 LACTIC ACID, ED: Lactic Acid, Venous: 3.2 mmol/L (ref 0.5–1.9)

## 2024-01-11 LAB — PROCALCITONIN: Procalcitonin: 1.88 ng/mL

## 2024-01-11 LAB — MAGNESIUM: Magnesium: 2.2 mg/dL (ref 1.7–2.4)

## 2024-01-11 LAB — ETHANOL: Alcohol, Ethyl (B): <15 mg/dL (ref <15)

## 2024-01-11 LAB — MRSA NEXT GEN BY PCR, NASAL: MRSA by PCR Next Gen: NOT DETECTED

## 2024-01-11 MED ORDER — SODIUM CHLORIDE 0.9 % IV SOLN
2.0000 g | Freq: Once | INTRAVENOUS | Status: AC
  Administered 2024-01-11: 2 g via INTRAVENOUS
  Filled 2024-01-11: qty 12.5

## 2024-01-11 MED ORDER — HEPARIN SODIUM (PORCINE) 5000 UNIT/ML IJ SOLN: 5000.0000 [IU] | Freq: Three times a day (TID) | INTRAMUSCULAR | Status: DC

## 2024-01-11 MED ORDER — IOHEXOL 350 MG/ML SOLN
75.0000 mL | Freq: Once | INTRAVENOUS | Status: AC | PRN
  Administered 2024-01-11: 75 mL via INTRAVENOUS

## 2024-01-11 MED ORDER — METRONIDAZOLE 500 MG/100ML IV SOLN
500.0000 mg | Freq: Once | INTRAVENOUS | Status: AC
  Administered 2024-01-11: 500 mg via INTRAVENOUS
  Filled 2024-01-11: qty 100

## 2024-01-11 MED ORDER — VANCOMYCIN HCL 1250 MG/250ML IV SOLN
1250.0000 mg | INTRAVENOUS | Status: AC
  Administered 2024-01-11: 1250 mg via INTRAVENOUS
  Filled 2024-01-11: qty 250

## 2024-01-11 MED ORDER — VANCOMYCIN HCL IN DEXTROSE 1-5 GM/200ML-% IV SOLN: 1000.0000 mg | Freq: Once | INTRAVENOUS | Status: DC

## 2024-01-11 MED ORDER — SODIUM CHLORIDE 0.9 % IV SOLN: 250.0000 mL | INTRAVENOUS | Status: AC

## 2024-01-11 MED ORDER — SODIUM CHLORIDE 0.9 % IV SOLN
250.0000 mL | Freq: Once | INTRAVENOUS | Status: AC
  Administered 2024-01-11: 250 mL via INTRAVENOUS

## 2024-01-11 MED ORDER — IOHEXOL 350 MG/ML SOLN
70.0000 mL | Freq: Once | INTRAVENOUS | Status: AC | PRN
  Administered 2024-01-11: 70 mL via INTRAVENOUS

## 2024-01-11 MED ORDER — HEPARIN (PORCINE) 25000 UT/250ML-% IV SOLN
1050.0000 [IU]/h | INTRAVENOUS | Status: DC
  Administered 2024-01-11: 900 [IU]/h via INTRAVENOUS
  Administered 2024-01-12: 1050 [IU]/h via INTRAVENOUS
  Filled 2024-01-11 (×2): qty 250

## 2024-01-11 MED ORDER — ALTEPLASE (ACTIVASE) 10MG BOLUS + 40 MG INFUSION
50.0000 mg | Freq: Once | INTRAVENOUS | Status: AC
  Administered 2024-01-11: 50 mg via INTRAVENOUS
  Filled 2024-01-11: qty 50

## 2024-01-11 MED ORDER — POLYETHYLENE GLYCOL 3350 17 G PO PACK: 17.0000 g | PACK | Freq: Every day | ORAL | Status: DC | PRN

## 2024-01-11 MED ORDER — CALCIUM GLUCONATE-NACL 1-0.675 GM/50ML-% IV SOLN
1.0000 g | Freq: Once | INTRAVENOUS | Status: AC
  Administered 2024-01-11: 1000 mg via INTRAVENOUS
  Filled 2024-01-11: qty 50

## 2024-01-11 MED ORDER — LACTATED RINGERS IV SOLN: INTRAVENOUS | Status: DC

## 2024-01-11 MED ORDER — LACTATED RINGERS IV BOLUS
1000.0000 mL | Freq: Once | INTRAVENOUS | Status: AC
  Administered 2024-01-11: 1000 mL via INTRAVENOUS

## 2024-01-11 MED ORDER — CHLORHEXIDINE GLUCONATE CLOTH 2 % EX PADS
6.0000 | MEDICATED_PAD | Freq: Every day | CUTANEOUS | Status: DC
  Administered 2024-01-11 – 2024-01-15 (×6): 6 via TOPICAL

## 2024-01-11 MED ORDER — DOCUSATE SODIUM 100 MG PO CAPS: 100.0000 mg | ORAL_CAPSULE | Freq: Two times a day (BID) | ORAL | Status: DC | PRN

## 2024-01-11 MED ORDER — VANCOMYCIN HCL 1250 MG/250ML IV SOLN: 1250.0000 mg | Freq: Once | INTRAVENOUS | Status: DC

## 2024-01-11 MED ORDER — VANCOMYCIN HCL 1500 MG/300ML IV SOLN: 1500.0000 mg | Freq: Once | INTRAVENOUS | Status: DC

## 2024-01-11 MED ORDER — MELATONIN 5 MG PO TABS
5.0000 mg | ORAL_TABLET | Freq: Every evening | ORAL | Status: DC | PRN
  Administered 2024-01-11: 5 mg via ORAL
  Filled 2024-01-11: qty 1

## 2024-01-11 MED ORDER — ASPIRIN 81 MG PO CHEW
81.0000 mg | CHEWABLE_TABLET | Freq: Every day | ORAL | Status: DC
  Administered 2024-01-11 – 2024-01-17 (×10): 81 mg via ORAL
  Filled 2024-01-11 (×7): qty 1

## 2024-01-11 MED ORDER — NOREPINEPHRINE 4 MG/250ML-% IV SOLN
0.0000 ug/min | INTRAVENOUS | Status: DC
  Administered 2024-01-11: 4 ug/min via INTRAVENOUS

## 2024-01-11 MED ORDER — SODIUM CHLORIDE 0.9 % IV BOLUS
1000.0000 mL | Freq: Once | INTRAVENOUS | Status: AC
  Administered 2024-01-11: 1000 mL via INTRAVENOUS

## 2024-01-11 MED ORDER — PERFLUTREN LIPID MICROSPHERE
1.0000 mL | INTRAVENOUS | Status: AC | PRN
  Administered 2024-01-11: 2 mL via INTRAVENOUS

## 2024-01-11 NOTE — ED Notes (Signed)
 Vasopressors started prior to order entered d/t provider order at bedside.

## 2024-01-11 NOTE — ED Triage Notes (Addendum)
 Patient arrives via [facility] for code stroke activated en route, called out for not feeling well. Patient called family and said he thought he would pass out. Patient assited to floor by son, no fall. Per EMS patient with possible syncopal episode in chair, urinary incontinence noted. Upon EMS arrival, patient awake and alert, only complaint is feeling weak. No pain. Lying BP 108/74 irregular HR 70s. Unable to walk, in truck- patient endorses feeling worse, new onset left side gaze. On monitor patient was bradycardic with PVCs, becomes agonal, bagging initiated. Pulses not palpable, BLS 2 rounds with ROSC, no meds given. PEA rhythm while pulseless, no vfib vtach or asystole, rate jumped to 120 afib after ROSC, switched to NRB. Given 500cc fluid en route. 18 EJ right side. Automatic last BP 80/40. Arousable on calling after ROSC. LKW 1015.   Hx of HTN  Able to follow commands upon arrival

## 2024-01-11 NOTE — ED Notes (Signed)
 Family at bedside.

## 2024-01-11 NOTE — Consult Note (Addendum)
 Cardiology Consultation   Patient ID: Don Howard MRN: [id number]; DOB: 13-May-1927  Admit date: 01/11/2024 Date of Consult: 01/11/2024  PCP:  Don Righter, MD   [facility] Providers Cardiologist:  Don Emmer, MD        Patient Profile: Don Howard is a 89 y.o. male with a hx of  hypertension, bradycardia who is being seen 01/11/2024 s/p out of hospital PEA arrest at the request of Dr. Sharyne.  History of Present Illness: Don Howard arrived to the hospital today with EMS. Per their runsheet, EMS was called out for weakness and near syncope with urine incontinence. Initially no focal neurological deficits noted. En route to the hospital, patient expressed not feeling well. During transport, he begame incoherent with fixed left sided gaze. He then develop apnea and pulses were non-palpable. EMS paused transport and initiated resuscitative measures with respiratory support via BVM. Per runsheet, after 2-3 rounds of BLS CPR, patient regained pulses and spontaneous respirations. At that point he was responsive to voice with groaning. Upon arrival to the ED, patient hypotensive with MAP <65. He was pale with left sided weakness. Stroke protocol followed with STAT cerebral imaging. No clear evidence of CVA. Patient subsequently initiated on vasopressors. Labs notable for troponin 246, creatinine 1.69, WBC 22.1 with HGB 12.2, lactic acid 3.2.  On my exam in the ED, patient is awake and alert. Does not appear in distress. Reports progressive weakness and fatigue with decreased appetite over the last several days. Also reports feeling cold over the last few days with cooler weather. On 8/4, patient saw his PCP with reported upper abdominal pain. Notes indicate suspicion of indigestion/GERD and patient was given rx for Omeprazole BID. Notes improved symptoms once he started. Denies chest pain, dyspnea/orthopnea. Patient does report right knee pain and swelling but states that he has this  intermittently with chronic orthopedic issues. Today as above patient with more significant weakness and near syncope prompting call to EMS.   Past Medical History:  Diagnosis Date   Hypertension     Past Surgical History:  Procedure Laterality Date   APPENDECTOMY     TONSILLECTOMY         Scheduled Meds:  Continuous Infusions:  sodium chloride      lactated ringers      lactated ringers  150 mL/hr at 01/11/24 1317   metronidazole  500 mg (01/11/24 1319)   norepinephrine  (LEVOPHED ) Adult infusion 10 mcg/min (01/11/24 1248)   vancomycin  1,250 mg (01/11/24 1323)   PRN Meds:   Allergies:   No Known Allergies  Social History:   Social History   Socioeconomic History   Marital status: Married    Spouse name: Not on file   Number of children: Not on file   Years of education: Not on file   Highest education level: Not on file  Occupational History   Not on file  Tobacco Use   Smoking status: Never   Smokeless tobacco: Never  Vaping Use   Vaping status: Never Used  Substance and Sexual Activity   Alcohol use: Never   Drug use: Never   Sexual activity: Not on file  Other Topics Concern   Not on file  Social History Narrative   Not on file   Social Drivers of Health   Financial Resource Strain: Not on file  Food Insecurity: Low Risk  (10/03/2023)   Received from [facility]   Hunger Vital Sign    Within the past 12 months, you worried that your food  would run out before you got money to buy more: Never true    Within the past 12 months, the food you bought just didn't last and you didn't have money to get more. : Never true  Transportation Needs: No Transportation Needs (10/03/2023)   Received from [facility]    In the past 12 months, has lack of reliable transportation kept you from medical appointments, meetings, work or from getting things needed for daily living? : No  Physical Activity: Not on file  Stress: Not on file  Social  Connections: Not on file  Intimate Partner Violence: Not on file    Family History:    Family History  Problem Relation Age of Onset   Pancreatic cancer Mother    Hypertension Father    CAD Neg Hx    Diabetes Neg Hx      ROS:  Please see the history of present illness.   All other ROS reviewed and negative.     Physical Exam/Data: Vitals:   01/11/24 1200 01/11/24 1230 01/11/24 1315 01/11/24 1330  BP: (!) 100/55 (!) 87/52 104/64   Pulse: (!) 106 (!) 106 (!) 102   Resp: 15 18 12    Temp:  (!) 95 F (35 C) (!) 95.8 F (35.4 C)   SpO2: 97% 94% 100% 94%  Weight:        Intake/Output Summary (Last 24 hours) at 01/11/2024 1406 Last data filed at 01/11/2024 1248 Gross per 24 hour  Intake 49.75 ml  Output --  Net 49.75 ml      01/11/2024   11:15 AM 03/21/2023    1:47 PM 03/16/2023   10:17 AM  Last 3 Weights  Weight (lbs) 138 lb 14.2 oz 140 lb 137 lb  Weight (kg) 63 kg 63.504 kg 62.143 kg     Body mass index is 19.93 kg/m.  General:  Pale appearing but no acute distress HEENT: normal Neck: no JVD Vascular: No carotid bruits; Distal pulses 2+ bilaterally Cardiac:  normal S1, S2; irregular with PVCs; no murmur  Lungs:  clear to auscultation bilaterally, no wheezing, rhonchi or rales  Abd: soft, nontender, no hepatomegaly  Ext: no edema Musculoskeletal:  No deformities, BUE and BLE strength normal and equal Skin: warm and dry  Neuro:  CNs 2-12 intact, no focal abnormalities noted Psych:  Normal affect   EKG:  The EKG was personally reviewed and demonstrates:  EKG shows sinus tachycardia with PVCs, RBBB morphology  Telemetry:  Telemetry was personally reviewed and demonstrates:  sinus tachycardia with frequent ventricular ectopy.  Relevant CV Studies:  10/13/22 TTE  IMPRESSIONS     1. Left ventricular ejection fraction, by estimation, is 60 to 65%. The  left ventricle has normal function. The left ventricle has no regional  wall motion abnormalities. Left  ventricular diastolic parameters are  consistent with Grade I diastolic  dysfunction (impaired relaxation).   2. Right ventricular systolic function is mildly reduced. The right  ventricular size is moderately enlarged.   3. Right atrial size was mildly dilated.   4. The mitral valve is normal in structure. Mild to moderate mitral valve  regurgitation.   5. The aortic valve is tricuspid. Aortic valve regurgitation is mild.   6. Pulmonic valve regurgitation is moderate.   7. The inferior vena cava is normal in size with greater than 50%  respiratory variability, suggesting right atrial pressure of 3 mmHg.   Comparison(s): The left ventricular function is unchanged.   Laboratory  Data: High Sensitivity Troponin:   Recent Labs  Lab 01/11/24 1106  TROPONINIHS 246*     Chemistry Recent Labs  Lab 01/11/24 1109 01/11/24 1223  NA 134* 132*  K 3.8 3.5  CL 100 99  CO2  --  18*  GLUCOSE 155* 190*  BUN 29* 28*  CREATININE 1.50* 1.69*  CALCIUM   --  7.9*  MG  --  2.2  GFRNONAA  --  37*  ANIONGAP  --  15    Recent Labs  Lab 01/11/24 1223  PROT 5.5*  ALBUMIN 2.9*  AST 219*  ALT 157*  ALKPHOS 50  BILITOT 1.8*   Lipids No results for input(s): CHOL, TRIG, HDL, LABVLDL, LDLCALC, CHOLHDL in the last 168 hours.  Hematology Recent Labs  Lab 01/11/24 1109 01/11/24 1223  WBC  --  22.1*  RBC  --  3.86*  HGB 13.6 12.2*  HCT 40.0 36.9*  MCV  --  95.6  MCH  --  31.6  MCHC  --  33.1  RDW  --  12.5  PLT  --  203   Thyroid  No results for input(s): TSH, FREET4 in the last 168 hours.  BNPNo results for input(s): BNP, PROBNP in the last 168 hours.  DDimer No results for input(s): DDIMER in the last 168 hours.  Radiology/Studies:  DG Chest Portable 1 View Result Date: 01/11/2024 CLINICAL DATA:  CPR EXAM: PORTABLE CHEST 1 VIEW COMPARISON:  08/30/2017 FINDINGS: Heart and mediastinal contours within normal limits. Bibasilar opacities, favor atelectasis. No  effusions. No acute bony abnormality. Aortic atherosclerosis. No visible rib fracture or pneumothorax. IMPRESSION: Bibasilar opacities, favor atelectasis. Electronically Signed   By: Franky Crease M.D.   On: 01/11/2024 13:25   CT ANGIO HEAD NECK W WO CM (CODE STROKE) Result Date: 01/11/2024 EXAM: CTA HEAD AND NECK WITHOUT AND WITH 01/11/2024 11:27:00 AM TECHNIQUE: CTA of the head and neck was performed without and with the administration of intravenous contrast. Multiplanar 2D and/or 3D reformatted images are provided for review. Automated exposure control, iterative reconstruction, and/or weight based adjustment of the mA/kV was utilized to reduce the radiation dose to as low as reasonably achievable. Stenosis of the internal carotid arteries measured using NASCET criteria. COMPARISON: None available CLINICAL HISTORY: Neuro deficit, acute, stroke suspected. Code stroke. Dr. Arora; Left sided gaze, aphasia. FINDINGS: CTA NECK: AORTIC ARCH AND ARCH VESSELS: The aortic arch demonstrates mild calcific atheromatous disease. There is common origin of the brachiocephalic artery and left common carotid artery. No dissection or arterial injury. No significant stenosis of the brachiocephalic or subclavian arteries. CERVICAL CAROTID ARTERIES: There is mild calcific plaque present within the carotid bulbs bilaterally, with less than 20% stenosis bilaterally. The cervical segments of the internal carotid arteries are otherwise normal in caliber. No dissection or arterial injury. CERVICAL VERTEBRAL ARTERIES: The vertebral arteries are widely patent. No dissection, arterial injury, or significant stenosis. LUNGS AND MEDIASTINUM: Unremarkable. SOFT TISSUES: No acute abnormality. BONES: No acute abnormality. CTA HEAD: ANTERIOR CIRCULATION: No significant stenosis of the internal carotid arteries. No significant stenosis of the anterior cerebral arteries. No significant stenosis of the middle cerebral arteries. No aneurysm. There  is mild calcific plaque within the carotid siphons, but no appreciable stenosis. POSTERIOR CIRCULATION: No significant stenosis of the posterior cerebral arteries. No significant stenosis of the basilar artery. No significant stenosis of the vertebral arteries. No aneurysm. OTHER: No dural venous sinus thrombosis on this non-dedicated study. Please note the above findings were communicated to Dr. Arora via  the Amion paging service at 11:35 am 01/11/2024. IMPRESSION: 1. No large vessel occlusion, hemodynamically significant stenosis, or aneurysm in the head or neck. 2. Mild calcific plaque within the carotid bulbs bilaterally, with less than 20% stenosis bilaterally. Electronically signed by: evalene coho 01/11/2024 11:41 AM EDT RP Workstation: [id number]   CT HEAD CODE STROKE WO CONTRAST Result Date: 01/11/2024 CLINICAL DATA:  Code stroke. EXAM: CT HEAD WITHOUT CONTRAST TECHNIQUE: Contiguous axial images were obtained from the base of the skull through the vertex without intravenous contrast. RADIATION DOSE REDUCTION: This exam was performed according to the departmental dose-optimization program which includes automated exposure control, adjustment of the mA and/or kV according to patient size and/or use of iterative reconstruction technique. COMPARISON:  None Available. FINDINGS: Brain: There is extensive chronic low-attenuation in the white matter Vascular: No dense vessel Skull: Normal. Negative for fracture or focal lesion. Sinuses/Orbits: No acute finding. Other: No hemorrhage ASPECTS (Alberta Stroke Program Early CT Score) - Ganglionic level infarction (caudate, lentiform nuclei, internal capsule, insula, M1-M3 cortex): 7 - Supraganglionic infarction (M4-M6 cortex): 3 Total score (0-10 with 10 being normal): 10 IMPRESSION: 1. Extensive chronic white matter abnormalities. No dense vessel or hemorrhage. 2. ASPECTS is 10 Electronically Signed   By: Nancyann Burns M.D.   On: 01/11/2024 11:29      Assessment and Plan:  Hypotension/shock s/p PEA arrest As above, patient with reported PEA arrest while in transit via [facility] to [facility] ED. Initial call to 911 was for focal weakness and near syncope. En route PEA arrest reported with ROSC after ~4 min CPR. Patient initially hypotensive, with MAP<65. Now on vasopressors with stable BP. Labs notable for AKI, elevated lactic acid (3.2), and significant leukocytosis. Patient temperature initially 95 degrees F. EKG shows sinus tachycardia with PVCs, RBBB morphology. Etiology of patient's fatigue/weakness and PEA arrest today unclear but I have high concern for PE. Complete echocardiogram is in process but on brief review of 4 chamber image, LVEF appears grossly normal. RV appears significantly dilated and hypokinetic with septal bowing. Highly concerning for PE. While patient does have prior CT chest imaging revealing coronary atherosclerosis, current presentation and EKG changes are not consistent with ACS. STAT CTA chest and heparin  ordered. Procalcitonin added to previously ordered infectious workup including blood cultures.  Vasopressors per PCCM, hold Amlodipine.  AKI Transaminitis Both creatine and LFTs elevated. With lactic acidosis, this could be secondary to hypoperfusion/volume depletion. Volume resuscitation per primary team.    Risk Assessment/Risk Scores:           For questions or updates, please contact  [facility] Please consult [email or web address] for contact info under    Signed, Artist Pouch, PA-C  01/11/2024 2:06 PM   I have seen and examined the patient along with Artist Pouch, PA-C .  I have reviewed the chart, notes and new data.  I agree with PA/NP's note.  Key new complaints: He had some lower chest pain a few days ago. Dyspnea has not been a prominent complaint. Now has some chest pain that sounds pleuritic (but he also had brief CPR).  Has not had recent trauma.  Has not had recent surgery other than  cataract/lens implants, most recently 2 weeks ago. Key examination changes: hypotensive on pressors, but the required dose to maintain BP has lessened a little. Awake, oriented. No leg edema or redness. Clear lungs. Breathing comfortably while fully flat Key new findings / data: Bedside review of echo shows massively dilated and severely dysfunctional right ventricle with  leftward displacement of the ventricular septum and McConnell sign, normal left ventricular systolic function, all consistent with acute cor pulmonale/massive or submassive pulmonary embolism.  ECG, which previously showed left bundle branch block, now shows a right bundle branch block pattern, normal sinus rhythm with PVCs.  Labs consistent with peripheral hypoperfusion with lactic acidosis and worsening renal function, as well as hepatic congestion due to high right heart filling pressures.  PLAN: Acute cor pulmonale with cardiogenic shock, most likely due to acute pulmonary embolism. Despite his advanced age, the risk of thrombolytics may be justified, as he may not otherwise survive this. Discussed with family and with the critical care medicine team.  Jerel Balding, MD, FACC [facility] [phone] 01/11/2024, 3:21 PM

## 2024-01-11 NOTE — Progress Notes (Signed)
 PHARMACY - ANTICOAGULATION CONSULT NOTE  Pharmacy Consult for heparin  Indication: pulmonary embolus  No Known Allergies  Patient Measurements: Height: 5' 10 (177.8 cm) Weight: 71.3 kg (157 lb 3 oz) IBW/kg (Calculated) : 73 HEPARIN  DW (KG): 63  Vital Signs: Temp: 96.5 F (35.8 C) (08/07 1500) Temp Source: Bladder (08/07 1601) BP: 94/75 (08/07 1500) Pulse Rate: 100 (08/07 1500)  Labs: Recent Labs    01/11/24 1106 01/11/24 1109 01/11/24 1223  HGB  --  13.6 12.2*  HCT  --  40.0 36.9*  PLT  --   --  203  APTT 23*  --   --   LABPROT 15.6*  --   --   INR 1.2  --   --   CREATININE  --  1.50* 1.69*  TROPONINIHS 246*  --   --     Estimated Creatinine Clearance: 26.4 mL/min (A) (by C-G formula based on SCr of 1.69 mg/dL (H)).   Medical History: Past Medical History:  Diagnosis Date   Hypertension     Medications:  Medications Prior to Admission  Medication Sig Dispense Refill Last Dose/Taking   omeprazole (PRILOSEC) 20 MG capsule Take 20 mg by mouth 2 (two) times daily.   Taking   amLODipine (NORVASC) 10 MG tablet Take 10 mg by mouth daily.      aspirin  81 MG EC tablet Take 81 mg by mouth daily. Swallow whole.       Assessment: 89 y.o. M presents with PE. S/p alteplase  50mg  (1/2 dose) ~1700. No AC PTA. CBC ok on admission.  aPTT 38 sec (okay to start heparin  when aPTT < 80 sec). CBC stable post alteplase  infusion.  Goal of Therapy:  Heparin  level 0.3-0.5 units/ml for first 24 hours post alteplase  (until 8/8 1700) then 0.3-0.7 units/ml thereafter Monitor platelets by anticoagulation protocol: Yes   Plan:  Heparin  gtt at 900 units/hr (12-14 units/hr). No bolus. Will f/u heparin  level in 8 hours Daily heparin  level and CBC  Vito Ralph, PharmD, BCPS Please see amion for complete clinical pharmacist phone list 01/11/2024,4:31 PM

## 2024-01-11 NOTE — Consult Note (Signed)
 NEUROLOGY CONSULT NOTE   Date of service: January 11, 2024 Patient Name: Don Howard MRN:  [id number] DOB:  1926/08/10 Chief Complaint: Code Stroke Requesting Provider: Freddi Hamilton, MD  History of Present Illness  Don Howard is a 89 y.o. male with hx of Htn, CAD, and bradycardia presenting initially with left gaze, dysarthric, and aphasia. Patient called EMS himself and on EMS arrival he was speaking and then EMS noted that he was bradycardic and started having a left gaze, stated he wasn't feeling well, and he started talking abnormally. He then became bradycardic, lost pulses 2 rounds of cpr with no meds.  No headache or chest pain. He does have a right bundle branch block with T wave inversions.  There was question of possible rapid A-fib during the way as well.  Frequent ectopy noted on monitor. Hypotensive now requiring pressors after fluid resuscitation.  On arrival he is speaking and breathing on his own. No focal weakness noted, generalized weakness. Speech is dysarthric. He did not take any medications this morning.  At baseline he lives independently and is still driving and mowing his yard.   LKW: 1015 Modified rankin score: 0-Completely asymptomatic and back to baseline post- stroke IV Thrombolysis: No, stroke not suspected EVT: No, no LVO   NIHSS components Score: Comment  1a Level of Conscious 0[x]  1[]  2[]  3[]      1b LOC Questions 0[x]  1[]  2[]       1c LOC Commands 0[x]  1[]  2[]       2 Best Gaze 0[x]  1[]  2[]       3 Visual 0[x]  1[]  2[]  3[]      4 Facial Palsy 0[x]  1[]  2[]  3[]      5a Motor Arm - left 0[x]  1[]  2[]  3[]  4[]  UN[]    5b Motor Arm - Right 0[x]  1[]  2[]  3[]  4[]  UN[]    6a Motor Leg - Left 0[]  1[]  2[x]  3[]  4[]  UN[]    6b Motor Leg - Right 0[]  1[]  2[x]  3[]  4[]  UN[]    7 Limb Ataxia 0[x]  1[]  2[]  UN[]      8 Sensory 0[x]  1[]  2[]  UN[]      9 Best Language 0[x]  1[]  2[]  3[]      10 Dysarthria 0[]  1[x]  2[]  UN[]      11 Extinct. and Inattention 0[x]  1[]  2[]       TOTAL:5        ROS  Comprehensive ROS performed and pertinent positives documented in HPI   Past History   Past Medical History:  Diagnosis Date   Hypertension     Past Surgical History:  Procedure Laterality Date   APPENDECTOMY     TONSILLECTOMY      Family History: Family History  Problem Relation Age of Onset   Pancreatic cancer Mother    Hypertension Father    CAD Neg Hx    Diabetes Neg Hx     Social History  reports that he has never smoked. He has never used smokeless tobacco. He reports that he does not drink alcohol and does not use drugs.  No Known Allergies  Medications   Current Facility-Administered Medications:    0.9 %  sodium chloride  infusion, 250 mL, Intravenous, Continuous, Freddi Hamilton, MD   lactated ringers  infusion, , Intravenous, Continuous, Sharyne Darina RAMAN, MD, Last Rate: 150 mL/hr at 01/11/24 1317, New Bag at 01/11/24 1317   metroNIDAZOLE  (FLAGYL ) IVPB 500 mg, 500 mg, Intravenous, Once, Sharyne Darina RAMAN, MD, Last Rate: 100 mL/hr at 01/11/24 1319, 500 mg at 01/11/24 1319  norepinephrine  (LEVOPHED ) 4mg  in (0.016 mg/mL) premix infusion, 0-10 mcg/min, Intravenous, Titrated, Freddi Hamilton, MD, Last Rate: 37.5 mL/hr at 01/13/2024 1248, 10 mcg/min at January 13, 2024 1248   vancomycin  (VANCOREADY) IVPB 1250 mg/250 mL, 1,250 mg, Intravenous, STAT, Merilee Linsey I, RPH, Last Rate: 166.7 mL/hr at 2024/01/13 1323, 1,250 mg at 2024-01-13 1323  Current Outpatient Medications:    omeprazole (PRILOSEC) 20 MG capsule, Take 20 mg by mouth 2 (two) times daily., Disp: , Rfl:    amLODipine (NORVASC) 10 MG tablet, Take 10 mg by mouth daily., Disp: , Rfl:    aspirin  81 MG EC tablet, Take 81 mg by mouth daily. Swallow whole., Disp: , Rfl:   Vitals   Vitals:   2024-01-13 1200 13-Jan-2024 1230 13-Jan-2024 1315 Jan 13, 2024 1330  BP: (!) 100/55 (!) 87/52 104/64   Pulse: (!) 106 (!) 106 (!) 102   Resp: 15 18 12    Temp:  (!) 95 F (35 C) (!) 95.8 F (35.4 C)   SpO2: 97% 94% 100%  94%  Weight:        Body mass index is 19.93 kg/m.   Physical Exam   Constitutional: Ill appearing, thin  Psych: Affect appropriate to situation.  Eyes: No scleral injection.  HENT: No OP obstruction.  Head: Normocephalic.  Cardiovascular: Irregular Respiratory: Effort normal, non-labored breathing. On supplemental O2, Nasal trumpet in place GI: Soft.  No distension. There is no tenderness.  Skin: Pale  Neurologic Examination   Neuro: Mental Status: Patient is awake, alert, oriented to person and place. Speech is dysarthric. Voice is hoarse Cranial Nerves: II: Visual Fields are full. Pupils are equal, round, and reactive to light.   III,IV, VI: EOMI without ptosis or diploplia.  V: Facial sensation is symmetric to temperature VII: Facial movement is symmetric resting and smiling VIII: Hearing is intact to voice X: Palate elevates symmetrically XI: Shoulder shrug is symmetric. XII: Tongue protrudes midline without atrophy or fasciculations.  Motor: Tone is normal. Bulk is poor Generalized weakness. Bilateral lower extremities drift to bed Maintains antigravity strength with bilateral upper extremities  Sensory: Sensation is symmetric to light touch  Cerebellar: No abnormal ataxia or myoclonus noted on exam   Labs/Imaging/Neurodiagnostic studies   CBC:  Recent Labs  Lab 2024-01-13 1109 01/13/2024 1223  WBC  --  22.1*  NEUTROABS  --  18.4*  HGB 13.6 12.2*  HCT 40.0 36.9*  MCV  --  95.6  PLT  --  203   Basic Metabolic Panel:  Lab Results  Component Value Date   NA 132 (L) 01-13-2024   K 3.5 01-13-2024   CO2 18 (L) 01-13-2024   GLUCOSE 190 (H) 01-13-24   BUN 28 (H) 13-Jan-2024   CREATININE 1.69 (H) Jan 13, 2024   CALCIUM  7.9 (L) 01-13-24   GFRNONAA 37 (L) January 13, 2024   GFRAA >60 08/30/2017   Lipid Panel:  Lab Results  Component Value Date   LDLCALC 49 08/31/2017   HgbA1c:  Lab Results  Component Value Date   HGBA1C 5.1 08/31/2017   Urine Drug  Screen: No results found for: LABOPIA, COCAINSCRNUR, LABBENZ, AMPHETMU, THCU, LABBARB  Alcohol Level     Component Value Date/Time   ETH <15 Jan 13, 2024 1106   INR  Lab Results  Component Value Date   INR 1.2 13-Jan-2024   APTT  Lab Results  Component Value Date   APTT 23 (L) 01-13-2024     CT Head without contrast(Personally reviewed): 1. Extensive chronic white matter abnormalities. No dense vessel or hemorrhage.  2. ASPECTS is 10  CT angio Head and Neck with contrast(Personally reviewed): 1. No large vessel occlusion, hemodynamically significant stenosis, or aneurysm in the head or neck. 2. Mild calcific plaque within the carotid bulbs bilaterally, with less than 20% stenosis bilaterally.   ASSESSMENT   Don Howard is a 89 y.o. male with a PMH of HTN and CAD who initially presented to EMS with left gaze, aphasia, and then subsequent PEA cardiac arrest with ROSC after 2 rounds of CPR. On arrival to ED he has no focal deficits, slow to respond to questions but nonfocal-and CT head and CTA with no emergent findings.  Labs reveal leukocytosis and lactic acidosis along with deranged liver profile.  He also went into rapid A-fib according to EMS. Likely sepsis picture-source unclear at this time. Given that he did have a gaze preference to the left and was somewhat difficult to respond to, this could be a left hemispheric TIA-more so because he also had abnormal heart rhythm reported per EMS in the field, but all of this could be transient cerebral hypoperfusion in the setting of sepsis/hypotension.  Seizure is also likely given advanced age.  Impression: TIA in the setting of sepsis and hypotension versus toxic metabolic encephalopathy versus brief seizure in the setting of acute illness  RECOMMENDATIONS  Admit to hospitalist Sepsis workup per ER and hospitalist.  Check chest x-ray, UA, cultures MRI brain without contrast when able to 2D echo A1c Plan Frequent  neurochecks Telemetry Therapy assessments Routine EEG Continue home aspirin  Blood pressure parameters-avoid hypotension, allow for permissive hypertension (do not treat if systolic is less than 220)  for the next 24 to 48 hours.  He is requiring pressors at this time. Plan discussed with Dr. Freddi Neurology will follow ______________________________________________________________________    Signed, Jorene Last, NP [facility]  Attending Neurohospitalist Addendum Patient seen and examined with APP/Resident. Agree with the history and physical as documented above. Agree with the plan as documented, which I helped formulate. I have independently reviewed the chart, obtained history, review of systems and examined the patient.I have personally reviewed pertinent head/neck/spine imaging (CT/MRI). Please feel free to call with any questions.  -- Eligio Lav, MD Neurologist [facility] Pager: [phone]  CRITICAL CARE ATTESTATION Performed by: Eligio Lav, MD Total critical care time: 45 minutes Critical care time was exclusive of separately billable procedures and treating other patients and/or supervising APPs/Residents/Students Critical care was necessary to treat or prevent imminent or life-threatening deterioration. This patient is critically ill and at significant risk for neurological worsening and/or death and care requires constant monitoring. Critical care was time spent personally by me on the following activities: development of treatment plan with patient and/or surrogate as well as nursing, discussions with consultants, evaluation of patient's response to treatment, examination of patient, obtaining history from patient or surrogate, ordering and performing treatments and interventions, ordering and review of laboratory studies, ordering and review of radiographic studies, pulse oximetry, re-evaluation of patient's condition, participation in  multidisciplinary rounds and medical decision making of high complexity in the care of this patient.

## 2024-01-11 NOTE — H&P (Signed)
 NAME:  Don Howard, MRN:  [id number], DOB:  [birth year], LOS: 0 ADMISSION DATE:  01/11/2024, CONSULTATION DATE:  8/7 REFERRING MD:  EDP, CHIEF COMPLAINT:  post arrest, hypotension    History of Present Illness:  89yo male with hx HTN, bradycardia and PVCs followed by cardiology Daine) who according to family has been not feeling well for the last 3-4 days with general malaise, fatigue, some vague gastric upset , epigastric discomfort and poor appetite. On 8/7 he had increased dizziness, weakness and near syncope and EMS was called. En route to hospital he stated he did not feel well, became bradycardic and apneic and lost pulse. He had 2 rounds CPR (total 4-5 mins) with ROSC and return of spontaneous respirations. On arrival to ER was hypothermic and hypotensive started on vasopressors. Stroke protocol followed initially with no clear evidence of CVA. L PCCM consulted for ICU admission, possible sepsis, hypotension and pressor requirement.  In ER he is awake, family at bedside who state he appears to be at baseline.  Lactate 3.2, WBC 22, troponin 246, SCr 1.69  Pertinent  Medical History   Past Medical History:  Diagnosis Date   Hypertension     Significant Hospital Events: Including procedures, antibiotic start and stop dates in addition to other pertinent events     Interim History / Subjective:  As above  Objective    Blood pressure 104/64, pulse (!) 102, temperature (!) 95.8 F (35.4 C), resp. rate 12, weight 63 kg, SpO2 94%.        Intake/Output Summary (Last 24 hours) at 01/11/2024 1444 Last data filed at 01/11/2024 1248 Gross per 24 hour  Intake 49.75 ml  Output --  Net 49.75 ml   Filed Weights   01/11/24 1115  Weight: 63 kg    Examination: General: thin, elderly male, NAD  HENT: mm dry, no JVD Lungs: resps even non labored on [place], clear  Cardiovascular: s1s2 rrr, Occasional PVC  Abdomen: soft, non tender, does c/o mild epigastric pain Extremities: warm and  dry Neuro: awake, alert, appropriate, MAE, oriented x3, non focal    Resolved problem list   Assessment and Plan   Cardiac Arrest - unclear etiology thus far. Troponin bump felt to be more related to demand rather then NSTEMI. - Admit to ICU at least overnight for close monitoring. - Cards consulted, appreciate the assistance. - Assess echo. - Repeat troponin. - Already hypothermic, will not place cooling pads but rather maintain as is and avoid fevers. - Continue PTA ASA.   Hypotension - certainly hypovolemic related, doubt true sepsis or cardiac but can't fully rule out yet. Hx HTN. - Additional 2L LR bolus then maintenance at 100/hr. - Wean NE to off, currently down from 10 to 4 already. - F/u on echo, PCT, repeat lactate. - Hold further abx for now, he is s/p 1 dose Vanc, Cefepime  and Flagyl  in ED. - Hold PTA Amlodipine.   AKI. Hypocalcemia. - Fluids as above. - 1g Ca gluconate. - Follow BMP.   Transaminitis - possibly 2/2 #1 but with only very brief arrest, unlikely to be only cause. With hx of epigastric pain and unclear etiology of recent illness, could be biliary process/etiology. - Supportive care as above. - Assess RUQ US . - Follow LFT's.   Concern for CVA - initial workup negative and ruled out in ED after CT head and CTA head/neck with no LVO. - Supportive care. - Mental status improving and following all commands, defer MRI as low  suspicion for CVA currently.  Best Practice (right click and Reselect all SmartList Selections daily)   Diet/type: NPO DVT prophylaxis systemic heparin  Pressure ulcer(s): N/A GI prophylaxis: N/A Lines: N/A Foley:  N/A Code Status:  DNR Last date of multidisciplinary goals of care discussion [8/7 pt and family]  Discussed at length with pt and family at bedside. Pt has living will (son to bring paperwork). In the event that his heart were to stop again despite abx, fluids, BP support pt and family agree that he would not want  to be on a machine. They have discussed this in the past as well as today after his brief arrest and are in agreement. Continue full medical care as described above but no CPR, no intubation.   Labs   CBC: Recent Labs  Lab 01/11/24 1109 01/11/24 1223  WBC  --  22.1*  NEUTROABS  --  18.4*  HGB 13.6 12.2*  HCT 40.0 36.9*  MCV  --  95.6  PLT  --  203    Basic Metabolic Panel: Recent Labs  Lab 01/11/24 1109 01/11/24 1223  NA 134* 132*  K 3.8 3.5  CL 100 99  CO2  --  18*  GLUCOSE 155* 190*  BUN 29* 28*  CREATININE 1.50* 1.69*  CALCIUM   --  7.9*  MG  --  2.2   GFR: CrCl cannot be calculated (Unknown ideal weight.). Recent Labs  Lab 01/11/24 1223 01/11/24 1256  WBC 22.1*  --   LATICACIDVEN  --  3.2*    Liver Function Tests: Recent Labs  Lab 01/11/24 1223  AST 219*  ALT 157*  ALKPHOS 50  BILITOT 1.8*  PROT 5.5*  ALBUMIN 2.9*   No results for input(s): LIPASE, AMYLASE in the last 168 hours. No results for input(s): AMMONIA in the last 168 hours.  ABG    Component Value Date/Time   TCO2 21 (L) 01/11/2024 1109     Coagulation Profile: Recent Labs  Lab 01/11/24 1106  INR 1.2    Cardiac Enzymes: No results for input(s): CKTOTAL, CKMB, CKMBINDEX, TROPONINI in the last 168 hours.  HbA1C: Hgb A1c MFr Bld  Date/Time Value Ref Range Status  08/31/2017 02:07 AM 5.1 4.8 - 5.6 % Final    Comment:    (NOTE) Pre diabetes:          5.7%-6.4% Diabetes:              >6.4% Glycemic control for   <7.0% adults with diabetes     CBG: Recent Labs  Lab 01/11/24 1102  GLUCAP 200*    Review of Systems:   As per HPI - All other systems reviewed and were neg.    Past Medical History:  He,  has a past medical history of Hypertension.   Surgical History:   Past Surgical History:  Procedure Laterality Date   APPENDECTOMY     TONSILLECTOMY       Social History:   reports that he has never smoked. He has never used smokeless tobacco. He  reports that he does not drink alcohol and does not use drugs.   Family History:  His family history includes Hypertension in his father; Pancreatic cancer in his mother. There is no history of CAD or Diabetes.   Allergies No Known Allergies   Home Medications  Prior to Admission medications   Medication Sig Start Date End Date Taking? Authorizing Provider  omeprazole (PRILOSEC) 20 MG capsule Take 20 mg by mouth 2 (two) times  daily.   Yes [provider]  amLODipine (NORVASC) 10 MG tablet Take 10 mg by mouth daily. 03/05/14   [provider]  aspirin  81 MG EC tablet Take 81 mg by mouth daily. Swallow whole.    [provider]     Critical care time:     Rockie Myers, NP Pulmonary/Critical Care Medicine  01/11/2024  2:44 PM   See Amion for personal pager PCCM on call pager [phone] until 7pm. Please call Elink 7p-7a. [phone]

## 2024-01-11 NOTE — Progress Notes (Addendum)
 PCCM Brief Note  Received message from Dr. Francyne regarding echo which demonstrated normal LV with severely dilated and almost motionless RV with + McConnells sign, highly suspicious for PE.  Risks and benefits of CTA explained to pt and family (SCr 1.7 though felt likely be due to dehydration). Family accepts risk and willing to proceed with CTA chest.  Discussed with Dr. Geronimo and Dr. Francyne. Risks outweigh benefits.  Proceed with STAT CTA chest. tPA to be determined shortly after CTA.   ADDENDUM 1605: Personally accompanied pt to CT scanner. CTA reviewed, some concern for R and L subsegmental PEs. Went to radiology reading room and reviewed images with radiology MD who confirmed presence of bilateral moderate to large PE buren at the subsegmental level with some fully occluding and some partially occluding as well as right heart strain (formal read pending).  Discussed with Dr. Geronimo who agrees with 1/2 dose alteplase . We reviewed contraindications, no absolute met and only relative is age > 63 (did have cataract surgery in past which would not qualify as major surgery). Met with family in conference room and discussed CTA findings. Family understands and willing to proceed with 1/2 dose alteplase  (50mg ).  Called pharmacy and placed order. Heparin  to start therafter per pharmacy protocol. Will add on LE duplex.   Additional CC time: 40 min.    Sammi Gore, PA - [facility] For pager details, please see AMION or use Epic chat  After 1900, please call [facility] for cross coverage needs 01/11/2024, 3:23 PM

## 2024-01-11 NOTE — ED Provider Notes (Signed)
 [place] EMERGENCY DEPARTMENT AT [facility] Provider Note  MDM   HPI/ROS:  Don Howard is a 89 y.o. male with a medical history as below who presents from home after feeling weak and dizzy with syncope.  On EMS arrival patient had left-sided deficits, proceeded to PEA arrest with ROSC after 2 rounds, no epi given.  Patient is able to tell me his name but otherwise is not able to offer much history on my examination.  Physical exam is notable for: - Lethargic appearing, pale, hypotensive, left-sided weakness compared to right  On my initial evaluation, patient is:  - Hypotensive, mild tachycardia. Patient afebrile, hemodynamically unstable, and ill-appearing -Additional history obtained from EMS, nephew  Differentials include ACS, dissection, hypoxic encephalopathy, metabolic encephalopathy, hypoglycemia, electrolyte abnormalities, hepatic encephalopathy, uremia, endocrine abnormality, CO2 narcosis, hypertensive encephalopathy, toxins/intoxication, alcohol withdrawal, drug reactions, TTP, vitamin deficiency, sepsis, meningitis, trauma, IPH, SAH, SDH, diffuse axonal injury, stroke, encephalitis, seizure, dementia, psychosis, delirium, hypo/hyperthermia  On initial assessment patient's airway is intact.  Initial blood pressure 150 systolic with narrow pulse pressure however IV access is attempting to be obtained with significant movement.  Relatively unreliable.  Manual pressure 80 systolic.  IV fluid bolus initiated.  EKG as below with a right bundle branch block and diffuse T wave inversions.  Frequent ectopy.  Given his presentation I have concern for possible cardiogenic syncope versus CVA.  He was brought in as a code stroke and was emergently taken to the CT scanner after immediate stabilization.  No obvious ischemia or LVO.  See neurology note for details.  Levophed  was initiated after initial fluid resuscitation with good response.  Maps now maintaining within the 60s.   Additional IV fluid boluses given with improvement of ectopy.  Did reach out to cardiology given patient's troponinemia, see their notes for details however defer heparinization at this time given this may be demand related.  Presentation most consistent with sepsis with uncertain source.  He does have rare bacteria and pyuria on UA which may be the cause.  He is already received antibiotics.  He was admitted to the ICU in serious condition.  Handoff given all questions answered the best my ability.  Interpretations, interventions, and the patient's course of care are documented below.    Clinical Course as of 01/11/24 1537  Thu Jan 11, 2024  1137 MAP (mmHg)(!): 62 [RC]  1140 Creatinine(!): 1.50 aki [RC]  1143 CT ANGIO HEAD NECK W WO CM (CODE STROKE) No LVO [RC]  1146 CT HEAD CODE STROKE WO CONTRAST No obvious ischemia [RC]  1238 Troponin I (High Sensitivity)(!!): 246 [RC]  1333 Temp(!): 95.8 F (35.4 C) [RC]  1342 Accepted by micu [RC]  1342 Spoke with cardiology PA, will review and provide recs on heparinization [RC]     1513 Bacteria, UA(!): RARE [RC]  1514 WBC, UA: 6-10 [RC]    Clinical Course User Index [RC] Sharyne Darina RAMAN, MD      Disposition:  I discussed the case with intensivist who graciously agreed to admit the patient to their service for continued care.   Clinical Impression:  1. Sepsis, due to unspecified organism, unspecified whether acute organ dysfunction present [facility])   2. Cardiac arrest [facility])       The plan for this patient was discussed with Dr. Freddi, who voiced agreement and who oversaw evaluation and treatment of this patient.   Clinical Complexity A medically appropriate history, review of systems, and physical exam was performed.  My independent  interpretations of EKG, labs, and radiology are documented in the ED course above.   If decision rules were used in this patient's evaluation, they are listed below.   Click here for ABCD2, HEART  and other calculatorsREFRESH Note before signing   Patient's presentation is most consistent with acute presentation with potential threat to life or bodily function.  Medical Decision Making Amount and/or Complexity of Data Reviewed Labs: ordered. Decision-making details documented in ED Course. Radiology: ordered. Decision-making details documented in ED Course.  Risk Prescription drug management. Decision regarding hospitalization.    HPI/ROS      See MDM section for pertinent HPI and ROS. A complete ROS was performed with pertinent positives/negatives noted above.   Past Medical History:  Diagnosis Date   Hypertension     Past Surgical History:  Procedure Laterality Date   APPENDECTOMY     TONSILLECTOMY        Physical Exam   Vitals:   01/11/24 1415 01/11/24 1430 01/11/24 1445 01/11/24 1500  BP: 130/87 124/88 (!) 127/94 94/75  Pulse: (!) 106 100 99 100  Resp: (!) 21 16 17  (!) 22  Temp: (!) 96.2 F (35.7 C) (!) 96.3 F (35.7 C) (!) 96.3 F (35.7 C) (!) 96.5 F (35.8 C)  SpO2: 99% 100% 100% 100%  Weight:    63 kg  Height:    5' 10 (1.778 m)    Physical Exam Vitals and nursing note reviewed.  Constitutional:      General: He is not in acute distress.    Appearance: He is well-developed. He is ill-appearing.  HENT:     Head: Normocephalic and atraumatic.     Mouth/Throat:     Mouth: Mucous membranes are dry.  Eyes:     Conjunctiva/sclera: Conjunctivae normal.  Cardiovascular:     Rate and Rhythm: Regular rhythm. Tachycardia present.     Heart sounds: No murmur heard. Pulmonary:     Effort: Pulmonary effort is normal. No respiratory distress.     Breath sounds: Normal breath sounds.  Abdominal:     Palpations: Abdomen is soft.     Tenderness: There is no abdominal tenderness.  Musculoskeletal:        General: No swelling.     Cervical back: Neck supple.  Skin:    General: Skin is warm and dry.     Capillary Refill: Capillary refill takes 2 to 3  seconds.     Coloration: Skin is pale.  Neurological:     Mental Status: He is confused.     GCS: GCS eye subscore is 4. GCS verbal subscore is 4. GCS motor subscore is 6.     Cranial Nerves: No facial asymmetry.     Motor: Weakness present.     Comments: Oriented to self, confused, minimally verbal.  Diffuse weakness left worse than right Difficult to obtain full neurologic assessment given patient's altered mentation and intermittent following commands.  Psychiatric:        Mood and Affect: Mood normal.      Procedures   If procedures were preformed on this patient, they are listed below:  Procedures   @BBSIG @   Please note that this documentation was produced with the assistance of voice-to-text technology and may contain errors.    Sharyne Darina RAMAN, MD 01/11/24 1540    Freddi Hamilton, MD 01/12/24 [phone]

## 2024-01-11 NOTE — Progress Notes (Signed)
 Requesting sleep aide. Will notify ccm

## 2024-01-11 NOTE — Code Documentation (Signed)
 Stroke Response Nurse Documentation Code Documentation  KODI STEIL is a 89 y.o. male arriving to [facility]  via [facility] activated as Code stroke. Patient had cardiac event en route with 2 rounds CPR performed. Arrived to ED alert, with pulses, breathing on own on NRB15L. Pt LKW 1015, he lives on his own though pt has not been feeling well for the last week.   Stroke team at bedside upon arrival. Pt taken to room first to ensure airway clearance by EDP due to s/p CPR. Labs drawn, placed on monitors, then taken to CT. NIH 5, see flowsheet. CT/CTA completed. Not a candidate for TNK as stroke not suspected. No LVO. Care Plan: NIH/vitals q2h x12h then per unit routine. Bedside handoff with ED RN Paden.    Tonna Lacks K  Rapid Response RN

## 2024-01-11 NOTE — Progress Notes (Signed)
 eLink Physician-Brief Progress Note Patient Name: Don Howard DOB: Apr 15, 1927 MRN: [id number]   Date of Service  01/11/2024  HPI/Events of Note  Insomnia  eICU Interventions  Melatonin     Intervention Category Minor Interventions: Routine modifications to care plan (e.g. PRN medications for pain, fever)  Reyansh Kushnir 01/11/2024, 10:17 PM

## 2024-01-11 NOTE — Plan of Care (Signed)

## 2024-01-11 NOTE — ED Notes (Signed)
 Echo at bedside

## 2024-01-11 NOTE — Sepsis Progress Note (Signed)
 Elink will follow per sepsis protocol.

## 2024-01-12 ENCOUNTER — Inpatient Hospital Stay (HOSPITAL_COMMUNITY)

## 2024-01-12 ENCOUNTER — Other Ambulatory Visit (HOSPITAL_COMMUNITY): Payer: Self-pay

## 2024-01-12 ENCOUNTER — Telehealth: Payer: Self-pay

## 2024-01-12 DIAGNOSIS — I639 Cerebral infarction, unspecified: Secondary | ICD-10-CM | POA: Diagnosis not present

## 2024-01-12 DIAGNOSIS — R4182 Altered mental status, unspecified: Secondary | ICD-10-CM

## 2024-01-12 DIAGNOSIS — Z86711 Personal history of pulmonary embolism: Secondary | ICD-10-CM | POA: Diagnosis not present

## 2024-01-12 DIAGNOSIS — I469 Cardiac arrest, cause unspecified: Secondary | ICD-10-CM | POA: Diagnosis not present

## 2024-01-12 DIAGNOSIS — J9601 Acute respiratory failure with hypoxia: Secondary | ICD-10-CM

## 2024-01-12 LAB — BASIC METABOLIC PANEL WITH GFR
Anion gap: 12 (ref 5–15)
BUN: 24 mg/dL — ABNORMAL HIGH (ref 8–23)
CO2: 20 mmol/L — ABNORMAL LOW (ref 22–32)
Calcium: 8.1 mg/dL — ABNORMAL LOW (ref 8.9–10.3)
Chloride: 106 mmol/L (ref 98–111)
Creatinine, Ser: 1.29 mg/dL — ABNORMAL HIGH (ref 0.61–1.24)
GFR, Estimated: 51 mL/min — ABNORMAL LOW (ref >60)
Glucose, Bld: 102 mg/dL — ABNORMAL HIGH (ref 70–99)
Potassium: 3.9 mmol/L (ref 3.5–5.1)
Sodium: 138 mmol/L (ref 135–145)

## 2024-01-12 LAB — HEPATIC FUNCTION PANEL
ALT: 111 U/L — ABNORMAL HIGH (ref 0–44)
AST: 114 U/L — ABNORMAL HIGH (ref 15–41)
Albumin: 2.4 g/dL — ABNORMAL LOW (ref 3.5–5.0)
Alkaline Phosphatase: 44 U/L (ref 38–126)
Bilirubin, Direct: 0.2 mg/dL (ref 0.0–0.2)
Indirect Bilirubin: 0.6 mg/dL (ref 0.3–0.9)
Total Bilirubin: 0.8 mg/dL (ref 0.0–1.2)
Total Protein: 4.6 g/dL — ABNORMAL LOW (ref 6.5–8.1)

## 2024-01-12 LAB — CBC
HCT: 30.3 % — ABNORMAL LOW (ref 39.0–52.0)
Hemoglobin: 10.2 g/dL — ABNORMAL LOW (ref 13.0–17.0)
MCH: 31.6 pg (ref 26.0–34.0)
MCHC: 33.7 g/dL (ref 30.0–36.0)
MCV: 93.8 fL (ref 80.0–100.0)
Platelets: 167 K/uL (ref 150–400)
RBC: 3.23 MIL/uL — ABNORMAL LOW (ref 4.22–5.81)
RDW: 12.6 % (ref 11.5–15.5)
WBC: 9.8 K/uL (ref 4.0–10.5)
nRBC: 0 % (ref 0.0–0.2)

## 2024-01-12 LAB — GLUCOSE, CAPILLARY
Glucose-Capillary: 103 mg/dL — ABNORMAL HIGH (ref 70–99)
Glucose-Capillary: 109 mg/dL — ABNORMAL HIGH (ref 70–99)
Glucose-Capillary: 119 mg/dL — ABNORMAL HIGH (ref 70–99)
Glucose-Capillary: 118 mg/dL — ABNORMAL HIGH (ref 70–99)
Glucose-Capillary: 110 mg/dL — ABNORMAL HIGH (ref 70–99)

## 2024-01-12 LAB — HEPARIN LEVEL (UNFRACTIONATED)
Heparin Unfractionated: 0.24 [IU]/mL — ABNORMAL LOW (ref 0.30–0.70)
Heparin Unfractionated: 0.42 [IU]/mL (ref 0.30–0.70)

## 2024-01-12 LAB — MAGNESIUM: Magnesium: 1.8 mg/dL (ref 1.7–2.4)

## 2024-01-12 LAB — PHOSPHORUS: Phosphorus: 3.6 mg/dL (ref 2.5–4.6)

## 2024-01-12 LAB — TROPONIN I (HIGH SENSITIVITY): Troponin I (High Sensitivity): 1227 ng/L (ref <18)

## 2024-01-12 MED ORDER — MAGNESIUM SULFATE 2 GM/50ML IV SOLN
2.0000 g | Freq: Once | INTRAVENOUS | Status: AC
  Administered 2024-01-12: 2 g via INTRAVENOUS
  Filled 2024-01-12: qty 50

## 2024-01-12 NOTE — TOC Initial Note (Signed)
 Transition of Care [facility]) - Initial/Assessment Note    Patient Details  Name: Don Howard MRN: [id number] Date of Birth: November 21, 1926  [facility]) CM/SW Contact:    Roxie KANDICE Stain, RN Phone Number: 01/12/2024, 1:00 PM  Clinical Narrative:          Spoke to patient regarding transition needs. Patient lives alone and grandson and nephews assist him.        Patient uses a cane but also has a walker.  Patient doesn't have prescription drug plan. This RNCM gave patient financial assistance form to complete.  ICM will continue to follow.    Expected Discharge Plan: Home w Home Health Services Barriers to Discharge: Continued Medical Work up   Patient Goals and CMS Choice Patient states their goals for this hospitalization and ongoing recovery are:: return home          Expected Discharge Plan and Services   Discharge Planning Services: Medication Assistance   Living arrangements for the past 2 months: Single Family Home                                      Prior Living Arrangements/Services Living arrangements for the past 2 months: Single Family Home Lives with:: Self Patient language and need for interpreter reviewed:: Yes Do you feel safe going back to the place where you live?: Yes      Need for Family Participation in Patient Care: Yes (Comment) Care giver support system in place?: Yes (comment) Current home services: DME (uses cane has a walker) Criminal Activity/Legal Involvement Pertinent to Current Situation/Hospitalization: No - Comment as needed  Activities of Daily Living      Permission Sought/Granted                  Emotional Assessment Appearance:: Appears stated age Attitude/Demeanor/Rapport: Engaged Affect (typically observed): Accepting Orientation: : Oriented to Self, Oriented to Place, Oriented to  Time, Oriented to Situation Alcohol / Substance Use: Not Applicable Psych Involvement: No (comment)  Admission  diagnosis:  Cardiac arrest (HCC) [I46.9] Sepsis, due to unspecified organism, unspecified whether acute organ dysfunction present [facility]) [A41.9] Patient Active Problem List   Diagnosis Date Noted   Cardiac arrest (HCC) 01/11/2024   Bilateral hearing loss 07/07/2020   Bilateral impacted cerumen 07/07/2020   Chest pain 08/30/2017   Abnormal EKG 08/30/2017   Leukocytosis 08/30/2017   PCP:  Kip Righter, MD Pharmacy:   [facility] [address], [place] - [address] [place] [address] Phone: [phone] Fax: [phone]     Social Drivers of Health (SDOH) Social History: SDOH Screenings   Food Insecurity: Low Risk  (10/03/2023)   Received from [facility]  Housing: Low Risk  (10/03/2023)   Received from [facility]  Transportation Needs: No Transportation Needs (10/03/2023)   Received from [facility]  Utilities: Low Risk  (10/03/2023)   Received from [facility]  Tobacco Use: Low Risk  (10/03/2023)   Received from [facility]   SDOH Interventions:     Readmission Risk Interventions     No data to display

## 2024-01-12 NOTE — Progress Notes (Signed)
 NAME:  Don Howard, MRN:  [id number], DOB:  1926/08/07, LOS: 1 ADMISSION DATE:  01/11/2024, CONSULTATION DATE:  01/12/24 REFERRING MD:  EDP   CHIEF COMPLAINT:   Cardiac arrest  History of Present Illness:  89 year old male with a past medical history of hypertension, bradycardia who presented to the hospital with PEA arrest.  EMS was called due to weakness and near syncope with urinary incontinence.  En route to the hospital patient became incoherent with fixed left-sided gaze and then developed apnea and pulse loss.  ROSC after 2-3 rounds of CPR.  In the ER, stroke protocol activated however no evidence of stroke.  Further workup revealed that patient had a massive PE with hemodynamic instability, chemical and radiological evidence of right heart strain on pressors.  Yesterday night he was given half dose tPA and started on a heparin  drip afterwards.  Pertinent  Medical History  Hypertension Bradycardia Constipation  Significant Hospital Events: Including procedures, antibiotic start and stop dates in addition to other pertinent events   Weaning pressors off today   Interim History / Subjective:  Patient is coherent and following commands.  He is complaining of weakness.  He also complains of pleuritic chest pain.  Objective    Blood pressure (!) 109/59, pulse 62, temperature 99 F (37.2 C), resp. rate 15, height 5' 10 (1.778 m), weight 70.6 kg, SpO2 100%.        Intake/Output Summary (Last 24 hours) at 01/12/2024 0756 Last data filed at 01/12/2024 0600 Gross per 24 hour  Intake 3679.17 ml  Output 1350 ml  Net 2329.17 ml   Filed Weights   01/11/24 1500 01/11/24 1601 01/12/24 0500  Weight: 63 kg 71.3 kg 70.6 kg    Examination: General: Elderly male not in acute distress Chest: Clear to auscultation bilaterally Heart: Normal S1, normal S2, regular rate and rhythm Abdomen: Soft, nontender, nondistended Extremities: Trace lower extremity edema Neuro: Grossly  intact  Resolved problem list   Assessment and Plan    Neuro  #AMS (present on admission, improved) #Concern for CVA  - Repeat head CT today per neuro. Patient received TPA yesterday. CT will be helpful to rule out any bleed  Improving likely due to and acute illness   Pulmonary  #Acute hypoxic respiratory failure # PE status post half dose tPA currently on heparin  - Can consider Eliquis tomorrow - Continue heparin  drip for today - No clear provoking factor for PE will consider lifelong anticoagulation. PA for eliquis. Currently does not appear to have medication insurance coverage   Cardiac  #RV strain  #Obstructive shock RV strain in the setting of massive PE.  He will need a repeat echocardiogram in 30 days. - Weaning pressors - No indication for steroids - Does not appear infected - Holding home amlodipine - Stop LR   GI #Transaminits (improving) - Likely due to hypotension  - Continue regular diet - No indication for GI prophylaxis - Bowel regimen as needed  Renal # AKI  - Likely prerenal improving with hydration  Hem/Onc - Continue heparin  drip today.  Can consider Eliquis tomorrow kidney function improving. Following anti Xa levels. Can increase goal 24 hours after TPA       Best Practice (right click and Reselect all SmartList Selections daily)   Diet/type: Regular consistency (see orders) DVT prophylaxis systemic heparin  Pressure ulcer(s): pressure ulcer assessment deferred  GI prophylaxis: N/A Lines: N/A Foley:  N/A Code Status:  DNR Last date of multidisciplinary goals of care discussion NA  Labs   CBC: Recent Labs  Lab 01/11/24 1109 01/11/24 1223 01/11/24 1853 01/12/24 0329  WBC  --  22.1* 13.9* 9.8  NEUTROABS  --  18.4*  --   --   HGB 13.6 12.2* 11.8* 10.2*  HCT 40.0 36.9* 35.4* 30.3*  MCV  --  95.6 94.7 93.8  PLT  --  203 196 167    Basic Metabolic Panel: Recent Labs  Lab 01/11/24 1109 01/11/24 1223 01/12/24 0329  NA  134* 132* 138  K 3.8 3.5 3.9  CL 100 99 106  CO2  --  18* 20*  GLUCOSE 155* 190* 102*  BUN 29* 28* 24*  CREATININE 1.50* 1.69* 1.29*  CALCIUM   --  7.9* 8.1*  MG  --  2.2 1.8  PHOS  --   --  3.6   GFR: Estimated Creatinine Clearance: 34.2 mL/min (A) (by C-G formula based on SCr of 1.29 mg/dL (H)). Recent Labs  Lab 01/11/24 1223 01/11/24 1256 01/11/24 1652 01/11/24 1853 01/12/24 0329  PROCALCITON  --   --  1.88  --   --   WBC 22.1*  --   --  13.9* 9.8  LATICACIDVEN  --  3.2* 3.1* 3.0*  --     Liver Function Tests: Recent Labs  Lab 01/11/24 1223 01/12/24 0329  AST 219* 114*  ALT 157* 111*  ALKPHOS 50 44  BILITOT 1.8* 0.8  PROT 5.5* 4.6*  ALBUMIN 2.9* 2.4*   No results for input(s): LIPASE, AMYLASE in the last 168 hours. No results for input(s): AMMONIA in the last 168 hours.  ABG    Component Value Date/Time   TCO2 21 (L) 01/11/2024 1109     Coagulation Profile: Recent Labs  Lab 01/11/24 1106 01/11/24 1853  INR 1.2 1.2    Cardiac Enzymes: No results for input(s): CKTOTAL, CKMB, CKMBINDEX, TROPONINI in the last 168 hours.  HbA1C: Hgb A1c MFr Bld  Date/Time Value Ref Range Status  08/31/2017 02:07 AM 5.1 4.8 - 5.6 % Final    Comment:    (NOTE) Pre diabetes:          5.7%-6.4% Diabetes:              >6.4% Glycemic control for   <7.0% adults with diabetes     CBG: Recent Labs  Lab 01/11/24 1102 01/11/24 1557 01/12/24 0732  GLUCAP 200* 158* 103*    Review of Systems:   Reviewed and negative except as above   Past Medical History:  He,  has a past medical history of Hypertension.   Surgical History:   Past Surgical History:  Procedure Laterality Date   APPENDECTOMY     TONSILLECTOMY       Social History:   reports that he has never smoked. He has never used smokeless tobacco. He reports that he does not drink alcohol and does not use drugs.   Family History:  His family history includes Hypertension in his father;  Pancreatic cancer in his mother. There is no history of CAD or Diabetes.   Allergies No Known Allergies   Home Medications  Prior to Admission medications   Medication Sig Start Date End Date Taking? Authorizing Provider  amLODipine (NORVASC) 10 MG tablet Take 10 mg by mouth daily. 03/05/14  Yes [provider]  ascorbic acid (VITAMIN C) 500 MG tablet Take 500 mg by mouth daily.   Yes [provider]  aspirin  81 MG EC tablet Take 81 mg by mouth daily. Swallow whole.  Yes [provider]  B Complex-C (SUPER B COMPLEX PO) Take 1 tablet by mouth daily.   Yes [provider]  Saw Palmetto, Serenoa repens, ([facility] SAW PALMETTO PO) Take 1 tablet by mouth daily.   Yes [provider]  VITAMIN E PO Take 1 tablet by mouth daily.   Yes [provider]         The patient is critically ill due to obstructive shock from massive PE.  Critical care was necessary to treat or prevent imminent or life-threatening deterioration. Critical care time was spent by me on the following activities: development of a treatment plan with the patient and/or surrogate as well as nursing, discussions with consultants, evaluation of the patient's response to treatment, examination of the patient, obtaining a history from the patient or surrogate, ordering and performing treatments and interventions, ordering and review of laboratory studies, ordering and review of radiographic studies, review of telemetry data including pulse oximetry, re-evaluation of patient's condition and participation in multidisciplinary rounds.   I personally spent 38 minutes providing critical care not including any separately billable procedures.   Zola LOISE Herter, MD [facility] 01/12/2024 3:01 PM

## 2024-01-12 NOTE — Telephone Encounter (Signed)
 Checking for pharmacy benefits

## 2024-01-12 NOTE — Progress Notes (Signed)
 PHARMACY - ANTICOAGULATION  Pharmacy Consult for heparin  Indication: pulmonary embolus Brief A/P: Heparin  level subtherapeutic Increase Heparin  rate  No Known Allergies  Patient Measurements: Height: 5' 10 (177.8 cm) Weight: 70.6 kg (155 lb 10.3 oz) IBW/kg (Calculated) : 73 HEPARIN  DW (KG): 63  Vital Signs: Temp: 99.9 F (37.7 C) (08/08 1145) Temp Source: Bladder (08/08 0800) BP: 101/56 (08/08 1145) Pulse Rate: 62 (08/08 1145)  Labs: Recent Labs    01/11/24 1106 01/11/24 1109 01/11/24 1109 01/11/24 1223 01/11/24 1652 01/11/24 1853 01/12/24 0329 01/12/24 0804 01/12/24 1212  HGB  --  13.6   < > 12.2*  --  11.8* 10.2*  --   --   HCT  --  40.0   < > 36.9*  --  35.4* 30.3*  --   --   PLT  --   --   --  203  --  196 167  --   --   APTT 23*  --   --   --   --  38*  --   --   --   LABPROT 15.6*  --   --   --   --  15.7*  --   --   --   INR 1.2  --   --   --   --  1.2  --   --   --   HEPARINUNFRC  --   --   --   --   --   --  0.24*  --  0.42  CREATININE  --  1.50*  --  1.69*  --   --  1.29*  --   --   TROPONINIHS 246*  --   --   --  1,539*  --   --  1,227*  --    < > = values in this interval not displayed.    Estimated Creatinine Clearance: 34.2 mL/min (A) (by C-G formula based on SCr of 1.29 mg/dL (H)).   Assessment: 89 yo male with PE s/p alteplase  for heparin   Heparin  level therapeutic at 0.42 on 1050 units/hr.   Goal of Therapy:  Heparin  level 0.3-0.5 units/ml for first 24 hours post alteplase  (until 8/8 1700) then 0.3-0.7 units/ml thereafter Monitor platelets by anticoagulation protocol: Yes   Plan:  Continue Heparin  1050 units/hr Heparin  level in AM Follow-up plan for DOAC tomorrow Patient assistance forms provided to patient   Harlene Boga, PharmD, BCPS, BCCCP Please refer to [facility] for [facility] Pharmacy numbers 01/12/2024,3:04 PM

## 2024-01-12 NOTE — Progress Notes (Signed)
 PHARMACY - ANTICOAGULATION  Pharmacy Consult for heparin  Indication: pulmonary embolus Brief A/P: Heparin  level subtherapeutic Increase Heparin  rate  No Known Allergies  Patient Measurements: Height: 5' 10 (177.8 cm) Weight: 71.3 kg (157 lb 3 oz) IBW/kg (Calculated) : 73 HEPARIN  DW (KG): 63  Vital Signs: Temp: 99.3 F (37.4 C) (08/08 0400) Temp Source: Bladder (08/08 0400) BP: 97/61 (08/08 0400) Pulse Rate: 54 (08/08 0400)  Labs: Recent Labs    01/11/24 1106 01/11/24 1109 01/11/24 1109 01/11/24 1223 01/11/24 1652 01/11/24 1853 01/12/24 0329  HGB  --  13.6   < > 12.2*  --  11.8* 10.2*  HCT  --  40.0   < > 36.9*  --  35.4* 30.3*  PLT  --   --   --  203  --  196 167  APTT 23*  --   --   --   --  38*  --   LABPROT 15.6*  --   --   --   --  15.7*  --   INR 1.2  --   --   --   --  1.2  --   HEPARINUNFRC  --   --   --   --   --   --  0.24*  CREATININE  --  1.50*  --  1.69*  --   --  1.29*  TROPONINIHS 246*  --   --   --  1,539*  --   --    < > = values in this interval not displayed.    Estimated Creatinine Clearance: 34.5 mL/min (A) (by C-G formula based on SCr of 1.29 mg/dL (H)).   Assessment: 89 yo male with PE s/p alteplase  for heparin   Goal of Therapy:  Heparin  level 0.3-0.5 units/ml for first 24 hours post alteplase  (until 8/8 1700) then 0.3-0.7 units/ml thereafter Monitor platelets by anticoagulation protocol: Yes   Plan:  Increase Heparin  1050 units/hr Check heparin  level in 8 hours.  Cathlyn Arrant, PharmD, BCPS  01/12/2024,5:01 AM

## 2024-01-12 NOTE — Progress Notes (Signed)
 NEUROLOGY CONSULT FOLLOW UP NOTE   Date of service: January 12, 2024 Patient Name: Don Howard MRN:  [id number] DOB:  1927-02-12  Interval Hx/subjective   Much better today, sitting up in bed. Reports he didn't sleep well overnight but feeling much better today.  Vitals stable, neuro exam non focal. Recommend repeat head CT   Vitals   Vitals:   01/12/24 0802 01/12/24 0815 01/12/24 0830 01/12/24 0845  BP:  (!) 104/50 (!) 88/80 (!) 96/49  Pulse: 64 68 64 68  Resp: 19 19 (!) 23 (!) 22  Temp: 99 F (37.2 C) 99 F (37.2 C) 99.1 F (37.3 C) 99.1 F (37.3 C)  TempSrc:      SpO2: 100% 100% 96% 100%  Weight:      Height:         Body mass index is 22.33 kg/m.  Physical Exam   Constitutional: Appears well-developed and well-nourished.  Psych: Affect appropriate to situation.  Eyes: No scleral injection.  HENT: No OP obstrucion.  Head: Normocephalic.  Cardiovascular: Normal rate and regular rhythm.  Respiratory: Effort normal, non-labored breathing.  GI: Soft.  No distension. There is no tenderness.  Skin: WDI.   Neurologic Examination   Neuro: Mental Status: Patient is awake, alert, oriented to person, place, month, year, and situation. Patient is able to give a clear and coherent history. No signs of aphasia or neglect Cranial Nerves: II: Visual Fields are full. Pupils are equal, round, and reactive to light.   III,IV, VI: EOMI without ptosis or diploplia.  V: Facial sensation is symmetric to temperature VII: Facial movement is symmetric resting and smiling VIII: Hearing is intact to voice X: Palate elevates symmetrically XI: Shoulder shrug is symmetric. XII: Tongue protrudes midline without atrophy or fasciculations.  Motor: Tone is normal. Bulk is normal. Elevates all extremities equal.  Sensory: Sensation is symmetric to light touch and temperature in the arms and legs. No extinction to DSS present.  Cerebellar: FNF and HKS are intact  bilaterally   Medications  Current Facility-Administered Medications:    0.9 %  sodium chloride  infusion, 250 mL, Intravenous, Continuous, Freddi Hamilton, MD   aspirin  chewable tablet 81 mg, 81 mg, Oral, Daily, Desai, Rahul P, PA-C, 81 mg at 01/11/24 1734   Chlorhexidine  Gluconate Cloth 2 % PADS 6 each, 6 each, Topical, Daily, Desai, Rahul P, PA-C, 6 each at 01/11/24 1606   docusate sodium  (COLACE) capsule 100 mg, 100 mg, Oral, BID PRN, Desai, Rahul P, PA-C   heparin  ADULT infusion 100 units/mL (25000 units/250mL), 1,050 Units/hr, Intravenous, Continuous, Hattar, Laith N, MD, Last Rate: 10.5 mL/hr at 01/12/24 0800, 1,050 Units/hr at 01/12/24 0800   lactated ringers  infusion, , Intravenous, Continuous, Desai, Rahul P, PA-C, Last Rate: 100 mL/hr at 01/12/24 0800, Infusion Verify at 01/12/24 0800   melatonin tablet 5 mg, 5 mg, Oral, QHS PRN, Paliwal, Aditya, MD, 5 mg at 01/11/24 2221   norepinephrine  (LEVOPHED ) 4mg  in (0.016 mg/mL) premix infusion, 0-10 mcg/min, Intravenous, Titrated, Freddi Hamilton, MD, Stopped at 01/12/24 0736   polyethylene glycol (MIRALAX  / GLYCOLAX ) packet 17 g, 17 g, Oral, Daily PRN, Meade Sammi SQUIBB, PA-C  Labs and Diagnostic Imaging   CBC:  Recent Labs  Lab 01/11/24 1223 01/11/24 1853 01/12/24 0329  WBC 22.1* 13.9* 9.8  NEUTROABS 18.4*  --   --   HGB 12.2* 11.8* 10.2*  HCT 36.9* 35.4* 30.3*  MCV 95.6 94.7 93.8  PLT 203 196 167    Basic Metabolic Panel:  Lab Results  Component Value Date   NA 138 01/12/2024   K 3.9 01/12/2024   CO2 20 (L) 01/12/2024   GLUCOSE 102 (H) 01/12/2024   BUN 24 (H) 01/12/2024   CREATININE 1.29 (H) 01/12/2024   CALCIUM  8.1 (L) 01/12/2024   GFRNONAA 51 (L) 01/12/2024   GFRAA >60 08/30/2017   Lipid Panel:  Lab Results  Component Value Date   LDLCALC 49 08/31/2017   HgbA1c:  Lab Results  Component Value Date   HGBA1C 5.1 08/31/2017   Urine Drug Screen:     Component Value Date/Time   LABOPIA NONE DETECTED  01/11/2024 1223   COCAINSCRNUR NONE DETECTED 01/11/2024 1223   LABBENZ NONE DETECTED 01/11/2024 1223   AMPHETMU NONE DETECTED 01/11/2024 1223   THCU NONE DETECTED 01/11/2024 1223   LABBARB NONE DETECTED 01/11/2024 1223    Alcohol Level     Component Value Date/Time   ETH <15 01/11/2024 1106   INR  Lab Results  Component Value Date   INR 1.2 01/11/2024   APTT  Lab Results  Component Value Date   APTT 38 (H) 01/11/2024   AED levels: No results found for: PHENYTOIN, ZONISAMIDE, LAMOTRIGINE, LEVETIRACETA  CT Head without contrast(Personally reviewed): 1. Extensive chronic white matter abnormalities. No dense vessel or hemorrhage. 2. ASPECTS is 10   CT angio Head and Neck with contrast(Personally reviewed): 1. No large vessel occlusion, hemodynamically significant stenosis, or aneurysm in the head or neck. 2. Mild calcific plaque within the carotid bulbs bilaterally, with less than 20% stenosis bilaterally.  Assessment   Don Howard is a 89 y.o. male with PMH HTN and CAD who initially presented to EMS with left gaze, aphasia, and then subsequent PEA cardiac arrest with ROSC after 2 rounds of CPR. On arrival to ED he has no focal deficits, slow to respond to questions but nonfocal-and CT head and CTA with no emergent findings.  Labs reveal leukocytosis and lactic acidosis along with deranged liver profile. CTA Chest and echo revealed that patient had a massive PE with hemodynamic instability and chemical and radiological evidence of right heart strain on pressors. Received half dose tPA and started on a heparin  drip afterwards. He currently has no focal neurological deficits and mentation is at baseline.   Recommendations  - CT Head to evaluate for stroke given hemorrhage risk with TPA and need for anticoagulation  - Neurology will remain available as needed Plan discussed with Dr.  Zaida ______________________________________________________________________   Signed, Jorene Last, NP [facility]  Attending Neurohospitalist Addendum Patient seen and examined with APP/Resident. Agree with the history and physical as documented above. Agree with the plan as documented, which I helped formulate. I have independently reviewed the chart, obtained history, review of systems and examined the patient.I have personally reviewed pertinent head/neck/spine imaging (CT/MRI). Please feel free to call with any questions.  -- Eligio Lav, MD Neurologist [facility] Pager: [phone]

## 2024-01-12 NOTE — Progress Notes (Signed)
 NAME:  Don Howard, MRN:  [id number], DOB:  10-Jul-1926, LOS: 1 ADMISSION DATE:  01/11/2024, CONSULTATION DATE:  01/11/2024 REFERRING MD:  EDP, CHIEF COMPLAINT:  Cardiac arrest    History of Present Illness:  89 year old male with a history of hypertension, bradycardia, and PVCs, followed by cardiology (Dr. Delford), presented after family reported 3-4 days of general malaise, fatigue, vague gastric upset, epigastric discomfort, and poor appetite.  On 8/7, he experienced increased dizziness, weakness, and a near-syncope episode, prompting a call to EMS. During transport, he reported feeling unwell, became bradycardic and apneic, and lost pulse. He received 2 rounds of CPR (total duration 4-5 minutes), achieving ROSC and return of spontaneous respirations.  Upon arrival to the ED, he was noted to be hypothermic and hypotensive, and was started on vasopressor support. PCCM was consulted for ICU admission due to concern for possible sepsis, persistent hypotension, and vasopressor requirement.  CT chest angio revealed  moderate to large volume acute pulmonary embolism with associated right heart strain/failure. No lung infarction  In the ED, the patient is awake, and family reports he appears at baseline.  Initial labs: Lactate: 3.2 WBC: 22 Troponin: 246 Serum Creatinine: 1.69  Pertinent  Medical History  HTN Bilateral hearing loss (mild)  Significant Hospital Events: Including procedures, antibiotic start and stop dates in addition to other pertinent events   08/7 : Admitted to ICU 08/7: Given 1/2 dose alteplase    Interim History / Subjective:  Overnight, he had trouble sleeping and got melatonin   Objective   Blood pressure (!) 113/53, pulse 69, temperature 99 F (37.2 C), temperature source Bladder, resp. rate 19, height 5' 10 (1.778 m), weight 70.6 kg, SpO2 100%.        Intake/Output Summary (Last 24 hours) at 01/12/2024 0843 Last data filed at 01/12/2024 0800 Gross per 24 hour   Intake 4021.98 ml  Output 1475 ml  Net 2546.98 ml   Filed Weights   01/11/24 1500 01/11/24 1601 01/12/24 0500  Weight: 63 kg 71.3 kg 70.6 kg    Examination: General: thin, elderly male, NAD  HENT: mm dry, no JVD Lungs: resps even non labored on [place], clear  Cardiovascular: s1s2 rrr, Occasional PVC  Abdomen: soft, non tender, does c/o mild epigastric pain Extremities: warm and dry Neuro: awake, alert, appropriate, MAE, oriented x3, non focal  Resolved Hospital Problem list   Chest pain   Assessment & Plan:  Neuro Concern for CVA - initial workup negative and ruled out in ED after CT head and CTA head/neck with no LVO. - Monitor   - Repeat CT head   Pulm / Resp: Massive PE CT chest angio revealed  moderate to large volume acute pulmonary embolism with associated right heart strain/failure. No lung infarction. Got TPA yesterday and transitioned to heparin  drip. - Switch to DOAC tomorrow  - Will need close follow up with his PCP and repeat ECHO in 30 days for PA dilation    Cardiovascular: Out of Hospital Cardiac arrest  Elevated Trops -Trops of 1539 from 246 on admission likely type 2 NSTEMI . ECHO showed normal LVEF with severely reduced right ventricular systolic function with moderate pulmonic valve regurgitation. Otherwise normal valves.  - Cardiology is following, awaiting their final recs - May need close follow up with Card outpatient   Obstructive Shock  - Hypotensive on admission requiring pressures - He is still on 1 of Levo, Lactic acid  down trending  - BP is 113/59 with a MAP of 75  Hx of HTN - On home amlodipine, Holding for now in the setting of hypotension   AKI Serum Cr of 1.29 from 1.69 on admission, likely pre-renal.Will continue to trend Cr and monitor in/out  Transaminitis  AST 114 < 219 ALT 111< 157  RUQ US  shows Cholelithiasis with pericholecystic fluid but negative sonographic Murphy. If there is concern for acute cholecystitis, correlation  with nuclear medicine hepatobiliary imaging could be obtained but this can be done outpatient if needed. I think this is due to poor perfusion and I anticipate getting better with fluids  - Resume diet    Hematologic: Hgb of 10.2 today with a baseline of 12-13. No active sign of bleeding. On heparin  drip. Switching to DOAC indefinite (unprovoked PE) - Trend CBC - Transfuse if Hgb > 7   Electrolytes - No electrolytes abnormalities at this time.   Nutrition  - Will resume diet today   Best Practice (right click and Reselect all SmartList Selections daily)   Diet/type: Regular consistency (see orders) DVT prophylaxis: systemic heparin  GI prophylaxis: N/A Lines: N/A Foley:  N/A Code Status:  DNR Last date of multidisciplinary goals of care discussion [Per above]  Labs   CBC: Recent Labs  Lab 01/11/24 1109 01/11/24 1223 01/11/24 1853 01/12/24 0329  WBC  --  22.1* 13.9* 9.8  NEUTROABS  --  18.4*  --   --   HGB 13.6 12.2* 11.8* 10.2*  HCT 40.0 36.9* 35.4* 30.3*  MCV  --  95.6 94.7 93.8  PLT  --  203 196 167    Basic Metabolic Panel: Recent Labs  Lab 01/11/24 1109 01/11/24 1223 01/12/24 0329  NA 134* 132* 138  K 3.8 3.5 3.9  CL 100 99 106  CO2  --  18* 20*  GLUCOSE 155* 190* 102*  BUN 29* 28* 24*  CREATININE 1.50* 1.69* 1.29*  CALCIUM   --  7.9* 8.1*  MG  --  2.2 1.8  PHOS  --   --  3.6   GFR: Estimated Creatinine Clearance: 34.2 mL/min (A) (by C-G formula based on SCr of 1.29 mg/dL (H)). Recent Labs  Lab 01/11/24 1223 01/11/24 1256 01/11/24 1652 01/11/24 1853 01/12/24 0329  PROCALCITON  --   --  1.88  --   --   WBC 22.1*  --   --  13.9* 9.8  LATICACIDVEN  --  3.2* 3.1* 3.0*  --     Liver Function Tests: Recent Labs  Lab 01/11/24 1223 01/12/24 0329  AST 219* 114*  ALT 157* 111*  ALKPHOS 50 44  BILITOT 1.8* 0.8  PROT 5.5* 4.6*  ALBUMIN 2.9* 2.4*   No results for input(s): LIPASE, AMYLASE in the last 168 hours. No results for input(s):  AMMONIA in the last 168 hours.  ABG    Component Value Date/Time   TCO2 21 (L) 01/11/2024 1109     Coagulation Profile: Recent Labs  Lab 01/11/24 1106 01/11/24 1853  INR 1.2 1.2    Cardiac Enzymes: No results for input(s): CKTOTAL, CKMB, CKMBINDEX, TROPONINI in the last 168 hours.  HbA1C: Hgb A1c MFr Bld  Date/Time Value Ref Range Status  08/31/2017 02:07 AM 5.1 4.8 - 5.6 % Final    Comment:    (NOTE) Pre diabetes:          5.7%-6.4% Diabetes:              >6.4% Glycemic control for   <7.0% adults with diabetes     CBG: Recent Labs  Lab  01/11/24 1102 01/11/24 1557 01/12/24 0732  GLUCAP 200* 158* 103*    Review of Systems:   Denies any chest pain or SOB   Past Medical History:  He,  has a past medical history of Hypertension.   Surgical History:   Past Surgical History:  Procedure Laterality Date   APPENDECTOMY     TONSILLECTOMY       Social History:   reports that he has never smoked. He has never used smokeless tobacco. He reports that he does not drink alcohol and does not use drugs.   Family History:  His family history includes Hypertension in his father; Pancreatic cancer in his mother. There is no history of CAD or Diabetes.   Allergies No Known Allergies   Home Medications  Prior to Admission medications   Medication Sig Start Date End Date Taking? Authorizing Provider  amLODipine (NORVASC) 10 MG tablet Take 10 mg by mouth daily. 03/05/14  Yes [provider]  ascorbic acid (VITAMIN C) 500 MG tablet Take 500 mg by mouth daily.   Yes [provider]  aspirin  81 MG EC tablet Take 81 mg by mouth daily. Swallow whole.   Yes [provider]  B Complex-C (SUPER B COMPLEX PO) Take 1 tablet by mouth daily.   Yes [provider]  Saw Palmetto, Serenoa repens, ([facility] SAW PALMETTO PO) Take 1 tablet by mouth daily.   Yes [provider]  VITAMIN E PO Take 1 tablet by mouth daily.   Yes [provider]     Critical care time: 40 min      Drue Grow MD [facility] Medicine Program - PGY-2 01/12/2024, 8:43 AM Pager# [phone]

## 2024-01-12 NOTE — Plan of Care (Signed)
   Problem: Health Behavior/Discharge Planning: Goal: Ability to manage health-related needs will improve Outcome: Progressing   Problem: Clinical Measurements: Goal: Ability to maintain clinical measurements within normal limits will improve Outcome: Progressing Goal: Will remain free from infection Outcome: Progressing Goal: Diagnostic test results will improve Outcome: Progressing

## 2024-01-12 NOTE — Progress Notes (Signed)
 [facility] ADULT ICU REPLACEMENT PROTOCOL   The patient does apply for the [facility] Adult ICU Electrolyte Replacment Protocol based on the criteria listed below:   1.Exclusion criteria: TCTS, ECMO, Dialysis, and Myasthenia Gravis patients 2. Is GFR >/= 30 ml/min? Yes.    Patient's GFR today is 51 3. Is SCr </= 2? Yes.   Patient's SCr is 1.29 mg/dL 4. Did SCr increase >/= 0.5 in 24 hours? No. 5.Pt's weight >40kg  Yes.   6. Abnormal electrolyte(s): Mag 1.8  7. Electrolytes replaced per protocol 8.  Call MD STAT for K+ </= 2.5, Phos </= 1, or Mag </= 1 Physician:  Haze Ole BIRCH [facility] 01/12/2024 4:57 AM

## 2024-01-12 NOTE — Progress Notes (Addendum)
  Progress Note  Patient Name: Don Howard Date of Encounter: 01/12/2024 [facility] Cardiologist: Maude Emmer, MD   Interval Summary   He is doing remarkably well.  Blood pressure in low normal range.  Frequent PVCs, but no other significant arrhythmia. His biggest complaint is inability to sleep well.  Vital Signs Vitals:   01/12/24 0930 01/12/24 0945 01/12/24 1000 01/12/24 1015  BP: 104/69 (!) 95/54 (!) 110/55 (!) 96/53  Pulse: 61 61 63 64  Resp: (!) 21 (!) 21 17 20   Temp: 99.1 F (37.3 C) 99.1 F (37.3 C) 99.3 F (37.4 C) 99.3 F (37.4 C)  TempSrc:      SpO2: 100% 96% 100% 97%  Weight:      Height:        Intake/Output Summary (Last 24 hours) at 01/12/2024 1028 Last data filed at 01/12/2024 1000 Gross per 24 hour  Intake 4243 ml  Output 1475 ml  Net 2768 ml      01/12/2024    5:00 AM 01/11/2024    4:01 PM 01/11/2024    3:00 PM  Last 3 Weights  Weight (lbs) 155 lb 10.3 oz 157 lb 3 oz 138 lb 14.2 oz  Weight (kg) 70.6 kg 71.3 kg 63 kg      Telemetry/ECG  Sinus rhythm frequent PVCs- Personally Reviewed  Physical Exam  GEN: No acute distress.   Neck: No JVD Cardiac: Patient with ectopy on a background of RRR, no murmurs, rubs, or gallops.  Respiratory: Clear to auscultation bilaterally. GI: Soft, nontender, non-distended  MS: No edema  Assessment & Plan  Presented with cardiogenic shock and transient PEA arrest due to large volume pulmonary embolism. Has done remarkably well with thrombolytics and anticoagulation.  Appears hemodynamically compensated.  Evidence of improved cardiac output with improving renal function. Troponin elevation consistent with demand injury and is trending downwards. Plan repeat echocardiogram in several days to reevaluate right ventricular function.  Avoid diuretics for the time being.   For questions or updates, please contact [facility] Please consult [email or web address] for contact info under       Signed, Jerel Balding, MD

## 2024-01-12 NOTE — Progress Notes (Signed)
 BLE venous duplex has been completed.   Results can be found under chart review under CV PROC. 01/12/2024 1:05 PM Mica Releford RVT, RDMS

## 2024-01-13 ENCOUNTER — Inpatient Hospital Stay (HOSPITAL_COMMUNITY)

## 2024-01-13 DIAGNOSIS — I469 Cardiac arrest, cause unspecified: Secondary | ICD-10-CM | POA: Diagnosis not present

## 2024-01-13 LAB — CBC
HCT: 29.4 % — ABNORMAL LOW (ref 39.0–52.0)
Hemoglobin: 10 g/dL — ABNORMAL LOW (ref 13.0–17.0)
MCH: 31.5 pg (ref 26.0–34.0)
MCHC: 34 g/dL (ref 30.0–36.0)
MCV: 92.7 fL (ref 80.0–100.0)
Platelets: 141 K/uL — ABNORMAL LOW (ref 150–400)
RBC: 3.17 MIL/uL — ABNORMAL LOW (ref 4.22–5.81)
RDW: 12.5 % (ref 11.5–15.5)
WBC: 8.7 K/uL (ref 4.0–10.5)
nRBC: 0 % (ref 0.0–0.2)

## 2024-01-13 LAB — HEPATIC FUNCTION PANEL
ALT: 86 U/L — ABNORMAL HIGH (ref 0–44)
AST: 80 U/L — ABNORMAL HIGH (ref 15–41)
Albumin: 2.3 g/dL — ABNORMAL LOW (ref 3.5–5.0)
Alkaline Phosphatase: 44 U/L (ref 38–126)
Bilirubin, Direct: 0.2 mg/dL (ref 0.0–0.2)
Indirect Bilirubin: 0.2 mg/dL — ABNORMAL LOW (ref 0.3–0.9)
Total Bilirubin: 0.4 mg/dL (ref 0.0–1.2)
Total Protein: 4.7 g/dL — ABNORMAL LOW (ref 6.5–8.1)

## 2024-01-13 LAB — BASIC METABOLIC PANEL WITH GFR
Anion gap: 7 (ref 5–15)
BUN: 20 mg/dL (ref 8–23)
CO2: 20 mmol/L — ABNORMAL LOW (ref 22–32)
Calcium: 7.8 mg/dL — ABNORMAL LOW (ref 8.9–10.3)
Chloride: 105 mmol/L (ref 98–111)
Creatinine, Ser: 0.91 mg/dL (ref 0.61–1.24)
GFR, Estimated: >60 mL/min (ref >60)
Glucose, Bld: 107 mg/dL — ABNORMAL HIGH (ref 70–99)
Potassium: 3.6 mmol/L (ref 3.5–5.1)
Sodium: 132 mmol/L — ABNORMAL LOW (ref 135–145)

## 2024-01-13 LAB — GLUCOSE, CAPILLARY
Glucose-Capillary: 97 mg/dL (ref 70–99)
Glucose-Capillary: 105 mg/dL — ABNORMAL HIGH (ref 70–99)

## 2024-01-13 LAB — HEPARIN LEVEL (UNFRACTIONATED): Heparin Unfractionated: 0.4 [IU]/mL (ref 0.30–0.70)

## 2024-01-13 MED ORDER — APIXABAN 5 MG PO TABS: 5.0000 mg | ORAL_TABLET | Freq: Two times a day (BID) | ORAL | Status: DC

## 2024-01-13 MED ORDER — ACETAMINOPHEN 325 MG PO TABS
650.0000 mg | ORAL_TABLET | Freq: Four times a day (QID) | ORAL | Status: DC | PRN
  Administered 2024-01-13: 650 mg via ORAL
  Filled 2024-01-13: qty 2

## 2024-01-13 MED ORDER — POTASSIUM CHLORIDE CRYS ER 20 MEQ PO TBCR
40.0000 meq | EXTENDED_RELEASE_TABLET | Freq: Once | ORAL | Status: AC
  Administered 2024-01-13: 40 meq via ORAL
  Filled 2024-01-13: qty 2

## 2024-01-13 MED ORDER — APIXABAN 5 MG PO TABS
10.0000 mg | ORAL_TABLET | Freq: Two times a day (BID) | ORAL | Status: DC
  Administered 2024-01-13 – 2024-01-17 (×14): 10 mg via ORAL
  Filled 2024-01-13 (×9): qty 2

## 2024-01-13 NOTE — Progress Notes (Signed)
 NAME:  Don Howard, MRN:  [id number], DOB:  1927/05/28, LOS: 2 ADMISSION DATE:  01/11/2024, CONSULTATION DATE:  01/12/24 REFERRING MD:  EDP   CHIEF COMPLAINT:   Cardiac arrest  History of Present Illness:  89 year old male with a past medical history of hypertension, bradycardia who presented to the hospital with PEA arrest.  EMS was called due to weakness and near syncope with urinary incontinence.  En route to the hospital patient became incoherent with fixed left-sided gaze and then developed apnea and pulse loss.  ROSC after 2-3 rounds of CPR.  In the ER, stroke protocol activated however no evidence of stroke.  Further workup revealed that patient had a massive PE with hemodynamic instability, chemical and radiological evidence of right heart strain on pressors.  Yesterday night he was given half dose tPA and started on a heparin  drip afterwards.  Pertinent  Medical History  Hypertension Bradycardia Constipation  Significant Hospital Events: Including procedures, antibiotic start and stop dates in addition to other pertinent events   Weaned off pressors today   Interim History / Subjective:  Patient is coherent and following commands.  He is complaining of weakness.  He also complains of pleuritic chest pain.  Objective    Blood pressure (!) 144/74, pulse 65, temperature 100.2 F (37.9 C), resp. rate (!) 22, height 5' 10 (1.778 m), weight 73.3 kg, SpO2 97%.        Intake/Output Summary (Last 24 hours) at 01/13/2024 0835 Last data filed at 01/13/2024 0700 Gross per 24 hour  Intake 541.49 ml  Output 585 ml  Net -43.51 ml   Filed Weights   01/11/24 1601 01/12/24 0500 01/13/24 0500  Weight: 71.3 kg 70.6 kg 73.3 kg    Examination: General: Elderly male not in acute distress Chest: Clear to auscultation bilaterally Heart: Normal S1, normal S2, regular rate and rhythm Abdomen: Soft, nontender, nondistended Extremities: Trace lower extremity edema Neuro: Grossly  intact  Resolved problem list   Assessment and Plan    Neuro  #AMS (present on admission, improved) #Concern for CVA (ruled out)  Improving likely due to and acute illness   Pulmonary  #Acute hypoxic respiratory failure # PE status post half dose tPA currently on heparin  - switched heparin  to elquis  - No clear provoking factor for PE will consider lifelong anticoagulation. PA for eliquis . Currently does not appear to have medication insurance coverage   Cardiac  #RV strain  #Obstructive shock RV strain in the setting of massive PE.  He will need a repeat echocardiogram in 30 days. -Off pressors  - No indication for steroids - Does not appear infected - Holding home amlodipine , can be resumed outpatient  - Stop LR   GI #Transaminits (improving) - Likely due to hypotension  - Continue regular diet - No indication for GI prophylaxis - Bowel regimen as needed  Renal # AKI  - Likely prerenal improved with hydration  Hem/Onc Switched to Eliquis        Best Practice (right click and Reselect all SmartList Selections daily)   Diet/type: Regular consistency (see orders) DVT prophylaxis Eliquis   Pressure ulcer(s): pressure ulcer assessment deferred  GI prophylaxis: N/A Lines: N/A Foley:  N/A Code Status:  DNR Last date of multidisciplinary goals of care discussion NA  Labs   CBC: Recent Labs  Lab 01/11/24 1109 01/11/24 1223 01/11/24 1853 01/12/24 0329 01/13/24 0304  WBC  --  22.1* 13.9* 9.8 8.7  NEUTROABS  --  18.4*  --   --   --  HGB 13.6 12.2* 11.8* 10.2* 10.0*  HCT 40.0 36.9* 35.4* 30.3* 29.4*  MCV  --  95.6 94.7 93.8 92.7  PLT  --  203 196 167 141*    Basic Metabolic Panel: Recent Labs  Lab 01/11/24 1109 01/11/24 1223 01/12/24 0329 01/13/24 0304  NA 134* 132* 138 132*  K 3.8 3.5 3.9 3.6  CL 100 99 106 105  CO2  --  18* 20* 20*  GLUCOSE 155* 190* 102* 107*  BUN 29* 28* 24* 20  CREATININE 1.50* 1.69* 1.29* 0.91  CALCIUM   --  7.9*  8.1* 7.8*  MG  --  2.2 1.8  --   PHOS  --   --  3.6  --    GFR: Estimated Creatinine Clearance: 50.1 mL/min (by C-G formula based on SCr of 0.91 mg/dL). Recent Labs  Lab 01/11/24 1223 01/11/24 1256 01/11/24 1652 01/11/24 1853 01/12/24 0329 01/13/24 0304  PROCALCITON  --   --  1.88  --   --   --   WBC 22.1*  --   --  13.9* 9.8 8.7  LATICACIDVEN  --  3.2* 3.1* 3.0*  --   --     Liver Function Tests: Recent Labs  Lab 01/11/24 1223 01/12/24 0329  AST 219* 114*  ALT 157* 111*  ALKPHOS 50 44  BILITOT 1.8* 0.8  PROT 5.5* 4.6*  ALBUMIN 2.9* 2.4*   No results for input(s): LIPASE, AMYLASE in the last 168 hours. No results for input(s): AMMONIA in the last 168 hours.  ABG    Component Value Date/Time   TCO2 21 (L) 01/11/2024 1109     Coagulation Profile: Recent Labs  Lab 01/11/24 1106 01/11/24 1853  INR 1.2 1.2    Cardiac Enzymes: No results for input(s): CKTOTAL, CKMB, CKMBINDEX, TROPONINI in the last 168 hours.  HbA1C: Hgb A1c MFr Bld  Date/Time Value Ref Range Status  08/31/2017 02:07 AM 5.1 4.8 - 5.6 % Final    Comment:    (NOTE) Pre diabetes:          5.7%-6.4% Diabetes:              >6.4% Glycemic control for   <7.0% adults with diabetes     CBG: Recent Labs  Lab 01/12/24 1508 01/12/24 1917 01/12/24 2314 01/13/24 0311 01/13/24 0801  GLUCAP 119* 118* 110* 97 105*    Review of Systems:   Reviewed and negative except as above   Past Medical History:  He,  has a past medical history of Hypertension.   Surgical History:   Past Surgical History:  Procedure Laterality Date   APPENDECTOMY     TONSILLECTOMY       Social History:   reports that he has never smoked. He has never used smokeless tobacco. He reports that he does not drink alcohol and does not use drugs.   Family History:  His family history includes Hypertension in his father; Pancreatic cancer in his mother. There is no history of CAD or Diabetes.    Allergies No Known Allergies   Home Medications  Prior to Admission medications   Medication Sig Start Date End Date Taking? Authorizing Provider  amLODipine  (NORVASC ) 10 MG tablet Take 10 mg by mouth daily. 03/05/14  Yes [provider]  ascorbic acid  (VITAMIN C ) 500 MG tablet Take 500 mg by mouth daily.   Yes [provider]  aspirin  81 MG EC tablet Take 81 mg by mouth daily. Swallow whole.   Yes [provider]  B Complex-C (SUPER B COMPLEX PO) Take 1 tablet by mouth daily.   Yes [provider]  Saw Palmetto, Serenoa repens, ([facility] SAW PALMETTO PO) Take 1 tablet by mouth daily.   Yes [provider]  VITAMIN E PO Take 1 tablet by mouth daily.   Yes [provider]          I spent 32 minutes caring for this patient today, including preparing to see the patient, reviewing a separately obtained history, performing a medically appropriate examination and/or evaluation, counseling and educating the patient/family/caregiver, ordering medications, tests, or procedures, referring and communicating with other health care professionals (not separately reported), documenting clinical information in the electronic health record, and independently interpreting results (not separately reported/billed) and communicating results to the patient/family/caregiver    Zola LOISE Herter, MD  Pulmonary Critical Care 01/13/2024 8:35 AM

## 2024-01-13 NOTE — Progress Notes (Signed)
 Progress Note  Patient Name: Don Howard Date of Encounter: 01/13/2024  Primary Cardiologist:   Maude Emmer, MD   Subjective   ***  Inpatient Medications    Scheduled Meds:  aspirin   81 mg Oral Daily   Chlorhexidine  Gluconate Cloth  6 each Topical Daily   Continuous Infusions:  heparin  1,050 Units/hr (01/13/24 0700)   PRN Meds: docusate sodium , melatonin, polyethylene glycol   Vital Signs    Vitals:   01/13/24 0400 01/13/24 0500 01/13/24 0600 01/13/24 0700  BP: (!) 118/56 (!) 128/58 125/67 (!) 144/74  Pulse: 64 63 64 65  Resp: (!) 22 20 19  (!) 22  Temp:    100.2 F (37.9 C)  TempSrc:      SpO2: 98% 98% 96% 97%  Weight:  73.3 kg    Height:        Intake/Output Summary (Last 24 hours) at 01/13/2024 0800 Last data filed at 01/13/2024 0700 Gross per 24 hour  Intake 541.49 ml  Output 585 ml  Net -43.51 ml   Filed Weights   01/11/24 1601 01/12/24 0500 01/13/24 0500  Weight: 71.3 kg 70.6 kg 73.3 kg    Telemetry    *** - Personally Reviewed  ECG    *** - Personally Reviewed  Physical Exam  *** GEN: No acute distress.  *** Neck: No *** JVD Cardiac: ***RR, *** murmurs, rubs, or gallops.  Respiratory: Clear *** to auscultation bilaterally. GI: Soft, nontender, non-distended  MS: No *** edema; No deformity. Neuro:  Nonfocal *** Psych: Normal affect ***  Labs    Chemistry Recent Labs  Lab 01/11/24 1223 01/12/24 0329 01/13/24 0304  NA 132* 138 132*  K 3.5 3.9 3.6  CL 99 106 105  CO2 18* 20* 20*  GLUCOSE 190* 102* 107*  BUN 28* 24* 20  CREATININE 1.69* 1.29* 0.91  CALCIUM  7.9* 8.1* 7.8*  PROT 5.5* 4.6*  --   ALBUMIN 2.9* 2.4*  --   AST 219* 114*  --   ALT 157* 111*  --   ALKPHOS 50 44  --   BILITOT 1.8* 0.8  --   GFRNONAA 37* 51* >60  ANIONGAP 15 12 7      Hematology Recent Labs  Lab 01/11/24 1853 01/12/24 0329 01/13/24 0304  WBC 13.9* 9.8 8.7  RBC 3.74* 3.23* 3.17*  HGB 11.8* 10.2* 10.0*  HCT 35.4* 30.3* 29.4*  MCV 94.7  93.8 92.7  MCH 31.6 31.6 31.5  MCHC 33.3 33.7 34.0  RDW 12.6 12.6 12.5  PLT 196 167 141*    Cardiac EnzymesNo results for input(s): TROPONINI in the last 168 hours. No results for input(s): TROPIPOC in the last 168 hours.   BNPNo results for input(s): BNP, PROBNP in the last 168 hours.   DDimer No results for input(s): DDIMER in the last 168 hours.   Radiology    CT HEAD WO CONTRAST ( ) Result Date: 01/12/2024 CLINICAL DATA:  Stroke, follow up EXAM: CT HEAD WITHOUT CONTRAST TECHNIQUE: Contiguous axial images were obtained from the base of the skull through the vertex without intravenous contrast. RADIATION DOSE REDUCTION: This exam was performed according to the departmental dose-optimization program which includes automated exposure control, adjustment of the mA and/or kV according to patient size and/or use of iterative reconstruction technique. COMPARISON:  CT head 01/11/2024 FINDINGS: Brain: Patchy and confluent areas of decreased attenuation are noted throughout the deep and periventricular white matter of the cerebral hemispheres bilaterally, compatible with chronic microvascular ischemic disease. No evidence  of large-territorial acute infarction. No parenchymal hemorrhage. No mass lesion. No extra-axial collection. No mass effect or midline shift. No hydrocephalus. Basilar cisterns are patent. Vascular: No hyperdense vessel. Skull: No acute fracture or focal lesion. Sinuses/Orbits: Paranasal sinuses and mastoid air cells are clear. Bilateral lens replacement. Otherwise the orbits are unremarkable. Other: None. IMPRESSION: No acute intracranial abnormality. Electronically Signed   By: Morgane  Naveau M.D.   On: 01/12/2024 21:08   VAS US  LOWER EXTREMITY VENOUS (DVT) Result Date: 01/12/2024  Lower Venous DVT Study Patient Name:  Don Howard  Date of Exam:   01/12/2024 Medical Rec #: [id number]       Accession #:    [id number] Date of Birth: 08-16-1926        Patient Gender: M Patient  Age:   89 years Exam Location:  [facility] Procedure:      VAS US  LOWER EXTREMITY VENOUS (DVT) Referring Phys: SAMMI GORE --------------------------------------------------------------------------------  Indications: Pulmonary embolism.  Comparison Study: No previous exams Performing Technologist: Jody Hill RVT, RDMS  Examination Guidelines: A complete evaluation includes B-mode imaging, spectral Doppler, color Doppler, and power Doppler as needed of all accessible portions of each vessel. Bilateral testing is considered an integral part of a complete examination. Limited examinations for reoccurring indications may be performed as noted. The reflux portion of the exam is performed with the patient in reverse Trendelenburg.  +---------+---------------+---------+-----------+----------+--------------+ RIGHT    CompressibilityPhasicitySpontaneityPropertiesThrombus Aging +---------+---------------+---------+-----------+----------+--------------+ CFV      Full           Yes      Yes                                 +---------+---------------+---------+-----------+----------+--------------+ SFJ      Full                                                        +---------+---------------+---------+-----------+----------+--------------+ FV Prox  Full           Yes      Yes                                 +---------+---------------+---------+-----------+----------+--------------+ FV Mid   Full           Yes      Yes                                 +---------+---------------+---------+-----------+----------+--------------+ FV DistalFull           Yes      Yes                                 +---------+---------------+---------+-----------+----------+--------------+ PFV      Full                                                        +---------+---------------+---------+-----------+----------+--------------+ POP      Full  Yes      Yes                                +---------+---------------+---------+-----------+----------+--------------+ PTV      Full                                                        +---------+---------------+---------+-----------+----------+--------------+ PERO     Full                                                        +---------+---------------+---------+-----------+----------+--------------+   limited visualization due to mobility limitations  +---------+---------------+---------+-----------+----------+--------------+ LEFT     CompressibilityPhasicitySpontaneityPropertiesThrombus Aging +---------+---------------+---------+-----------+----------+--------------+ CFV      Full           Yes      Yes                                 +---------+---------------+---------+-----------+----------+--------------+ SFJ      Full                                                        +---------+---------------+---------+-----------+----------+--------------+ FV Prox  Full           Yes      Yes                                 +---------+---------------+---------+-----------+----------+--------------+ FV Mid   Full           Yes      Yes                                 +---------+---------------+---------+-----------+----------+--------------+ FV DistalFull           Yes      Yes                                 +---------+---------------+---------+-----------+----------+--------------+ PFV      Full                                                        +---------+---------------+---------+-----------+----------+--------------+ POP      Full           Yes      Yes                                 +---------+---------------+---------+-----------+----------+--------------+ PTV      Full                                                        +---------+---------------+---------+-----------+----------+--------------+  PERO     Full                                                         +---------+---------------+---------+-----------+----------+--------------+     Summary: BILATERAL: - No evidence of deep vein thrombosis seen in the lower extremities, bilaterally. - RIGHT: - No cystic structure found in the popliteal fossa.  LEFT: - A cystic structure is found in the popliteal fossa 4.19 x 0.87 x 2.81 cm.  *See table(s) above for measurements and observations. Electronically signed by Norman Serve on 01/12/2024 at 4:41:28 PM.    Final    US  Abdomen Limited RUQ (LIVER/GB) Result Date: 01/11/2024 CLINICAL DATA:  Transaminitis EXAM: ULTRASOUND ABDOMEN LIMITED RIGHT UPPER QUADRANT COMPARISON:  CT 03/04/2021 FINDINGS: Gallbladder: Multiple shadowing gallstones. Wall thickness at 2.3 mm. Negative sonographic Murphy. Positive for pericholecystic fluid. Common bile duct: Diameter: 3.3 mm Liver: No focal hepatic abnormality. The liver appears slightly echogenic on some of the images. Portal vein is patent on color Doppler imaging with normal direction of blood flow towards the liver. Other: None. IMPRESSION: 1. Cholelithiasis with pericholecystic fluid but negative sonographic Murphy. If there is concern for acute cholecystitis, correlation with nuclear medicine hepatobiliary imaging could be obtained. 2. Possible slight increased echogenicity of liver as may be seen with steatosis. Electronically Signed   By: Luke Bun M.D.   On: 01/11/2024 21:59   ECHOCARDIOGRAM COMPLETE Result Date: 01/11/2024    ECHOCARDIOGRAM REPORT   Patient Name:   Don Howard Date of Exam: 01/11/2024 Medical Rec #:  [id number]      Height:       70.0 in Accession #:    [id number]     Weight:       138.9 lb Date of Birth:  [birth year]       BSA:          1.788 m Patient Age:    89 years       BP:           124/88 mmHg Patient Gender: M              HR:           104 bpm. Exam Location:  Inpatient Procedure: 2D Echo, Color Doppler, Cardiac Doppler and Intracardiac            Opacification Agent (Both Spectral and  Color Flow Doppler were            utilized during procedure). Indications:    Shock R57.9  History:        Patient has prior history of Echocardiogram examinations, most                 recent 10/13/2022. Risk Factors:Hypertension.  Sonographer:    Jayson Gaskins Referring Phys: [id number] ARTIST POUCH IMPRESSIONS  1. Left ventricular ejection fraction, by estimation, is 60 to 65%. The left ventricle has normal function. The left ventricle has no regional wall motion abnormalities. There is mild concentric left ventricular hypertrophy. Left ventricular diastolic function could not be evaluated.  2. Right ventricular systolic function is severely reduced. The right ventricular size is moderately enlarged. Tricuspid regurgitation signal is inadequate for assessing PA pressure.  3. The mitral valve is normal in structure. No evidence of mitral valve regurgitation. No evidence of mitral  stenosis.  4. The aortic valve is calcified. There is moderate calcification of the aortic valve. There is moderate thickening of the aortic valve. Aortic valve regurgitation is mild. Aortic valve sclerosis/calcification is present, without any evidence of aortic stenosis. Aortic valve area, by VTI measures 2.70 cm. Aortic valve mean gradient measures 2.0 mmHg. Aortic valve Vmax measures 0.93 m/s. FINDINGS  Left Ventricle: Left ventricular ejection fraction, by estimation, is 60 to 65%. The left ventricle has normal function. The left ventricle has no regional wall motion abnormalities. Definity  contrast agent was given IV to delineate the left ventricular  endocardial borders. The left ventricular internal cavity size was normal in size. There is mild concentric left ventricular hypertrophy. Left ventricular diastolic function could not be evaluated. Right Ventricle: The right ventricular size is moderately enlarged. No increase in right ventricular wall thickness. Right ventricular systolic function is severely reduced. Tricuspid  regurgitation signal is inadequate for assessing PA pressure. Left Atrium: Left atrial size was normal in size. Right Atrium: Right atrial size was normal in size. Pericardium: There is no evidence of pericardial effusion. Mitral Valve: The mitral valve is normal in structure. No evidence of mitral valve regurgitation. No evidence of mitral valve stenosis. Tricuspid Valve: The tricuspid valve is normal in structure. Tricuspid valve regurgitation is mild . No evidence of tricuspid stenosis. Aortic Valve: The aortic valve is calcified. There is moderate calcification of the aortic valve. There is moderate thickening of the aortic valve. Aortic valve regurgitation is mild. Aortic valve sclerosis/calcification is present, without any evidence of aortic stenosis. Aortic valve mean gradient measures 2.0 mmHg. Aortic valve peak gradient measures 3.5 mmHg. Aortic valve area, by VTI measures 2.70 cm. Pulmonic Valve: The pulmonic valve was normal in structure. Pulmonic valve regurgitation is mild. No evidence of pulmonic stenosis. Aorta: The aortic root is normal in size and structure. Venous: The inferior vena cava was not well visualized. IAS/Shunts: No atrial level shunt detected by color flow Doppler.  LEFT VENTRICLE PLAX 2D LVIDd:         3.80 cm LVIDs:         2.90 cm LV PW:         1.20 cm LV IVS:        1.30 cm LVOT diam:     1.80 cm LV SV:         40 LV SV Index:   22 LVOT Area:     2.54 cm  RIGHT VENTRICLE RV Basal diam:  5.30 cm RV Mid diam:    4.50 cm RV S prime:     10.00 cm/s TAPSE (M-mode): 1.8 cm LEFT ATRIUM             Index LA Vol (A2C):   16.7 ml 9.34 ml/m LA Vol (A4C):   24.7 ml 13.82 ml/m LA Biplane Vol: 20.5 ml 11.47 ml/m  AORTIC VALVE AV Area (Vmax):    2.76 cm AV Area (Vmean):   2.09 cm AV Area (VTI):     2.70 cm AV Vmax:           93.20 cm/s AV Vmean:          70.600 cm/s AV VTI:            0.148 m AV Peak Grad:      3.5 mmHg AV Mean Grad:      2.0 mmHg LVOT Vmax:         101.00 cm/s LVOT  Vmean:  58.000 cm/s LVOT VTI:          0.157 m LVOT/AV VTI ratio: 1.06  AORTA Ao Root diam: 3.00 cm  SHUNTS Systemic VTI:  0.16 m Systemic Diam: 1.80 cm Wilbert Bihari MD Electronically signed by Wilbert Bihari MD Signature Date/Time: 01/11/2024/5:52:59 PM    Final    CT Angio Chest Pulmonary Embolism (PE) W or WO Contrast Result Date: 01/11/2024 CLINICAL DATA:  Pulmonary embolism (PE) suspected, high prob. Cardiac arrest. EXAM: CT ANGIOGRAPHY CHEST WITH CONTRAST TECHNIQUE: Multidetector CT imaging of the chest was performed using the standard protocol during bolus administration of intravenous contrast. Multiplanar CT image reconstructions and MIPs were obtained to evaluate the vascular anatomy. RADIATION DOSE REDUCTION: This exam was performed according to the departmental dose-optimization program which includes automated exposure control, adjustment of the mA and/or kV according to patient size and/or use of iterative reconstruction technique. CONTRAST:  70mL OMNIPAQUE  IOHEXOL  350 MG/ML SOLN COMPARISON:  CT angiography chest from 08/30/2017. FINDINGS: Cardiovascular: There is moderate to large volume acute pulmonary embolism. There are nonocclusive filling defects in the left upper lobe lobar pulmonary artery branch with extension into the segmental branches. There is occlusive thrombus in the lingular segment branch. There is nonocclusive thrombus in the left lung lower lobe lobar pulmonary artery branch with occlusive and nonocclusive filling defects in the left lobe lower lobe segmental and subsegmental branches. There are occlusive and nonocclusive filling defects in the right upper lobe, middle lobe and right lower lobe segmental and subsegmental pulmonary artery branches. There is associated right heart strain/failure. No lung infarction. There is dilation of the main pulmonary trunk measuring up to 3.2 cm, which is nonspecific but can be seen with pulmonary artery hypertension. Top-normal heart size. No  pericardial effusion. No aortic aneurysm. There are coronary artery calcifications, in keeping with coronary artery disease. There are also mild peripheral atherosclerotic vascular calcifications of thoracic aorta and its major branches. Mediastinum/Nodes: Visualized thyroid  gland appears grossly unremarkable. No solid / cystic mediastinal masses. The esophagus is nondistended precluding optimal assessment. No axillary, mediastinal or hilar lymphadenopathy by size criteria. Lungs/Pleura: The central tracheo-bronchial tree is patent. There are patchy areas of linear, plate-like atelectasis and/or scarring throughout bilateral lungs. No mass or consolidation. No pleural effusion or pneumothorax. No suspicious lung nodules. Upper Abdomen: Visualized upper abdominal viscera within normal limits. Musculoskeletal: Several foci of air noted in the bilateral brachiocephalic veins, likely related to peripheral IV access. The visualized soft tissues of the chest wall are grossly unremarkable. No suspicious osseous lesions. There are mild to moderate multilevel degenerative changes in the visualized spine. There is acute undisplaced fracture of the upper body of the sternum (series 8, image 98 and series 9, image 95). There also undisplaced fractures of the right fourth through seventh costal cartilages. There is also buckling of the right anteromedial sixth rib, for which underlying acute fracture cannot be excluded. Review of the MIP images confirms the above findings. IMPRESSION: 1. There is moderate to large volume acute pulmonary embolism with associated right heart strain/failure. No lung infarction. 2. Upper sternal body and multiple right-sided costal cartilage/rib fractures, as described above. 3. Multiple other nonacute observations, as described above. Aortic Atherosclerosis (ICD10-I70.0). Critical Value/emergent results were discussed in person, at the time of interpretation on 01/11/2024 at 3:52 pm to provider Dr.  Meade. Electronically Signed   By: Ree Molt M.D.   On: 01/11/2024 16:07   DG Chest Portable 1 View Result Date: 01/11/2024 CLINICAL DATA:  CPR EXAM:  PORTABLE CHEST 1 VIEW COMPARISON:  08/30/2017 FINDINGS: Heart and mediastinal contours within normal limits. Bibasilar opacities, favor atelectasis. No effusions. No acute bony abnormality. Aortic atherosclerosis. No visible rib fracture or pneumothorax. IMPRESSION: Bibasilar opacities, favor atelectasis. Electronically Signed   By: Franky Crease M.D.   On: 01/11/2024 13:25   CT ANGIO HEAD NECK W WO CM (CODE STROKE) Result Date: 01/11/2024 EXAM: CTA HEAD AND NECK WITHOUT AND WITH 01/11/2024 11:27:00 AM TECHNIQUE: CTA of the head and neck was performed without and with the administration of intravenous contrast. Multiplanar 2D and/or 3D reformatted images are provided for review. Automated exposure control, iterative reconstruction, and/or weight based adjustment of the mA/kV was utilized to reduce the radiation dose to as low as reasonably achievable. Stenosis of the internal carotid arteries measured using NASCET criteria. COMPARISON: None available CLINICAL HISTORY: Neuro deficit, acute, stroke suspected. Code stroke. Dr. Arora; Left sided gaze, aphasia. FINDINGS: CTA NECK: AORTIC ARCH AND ARCH VESSELS: The aortic arch demonstrates mild calcific atheromatous disease. There is common origin of the brachiocephalic artery and left common carotid artery. No dissection or arterial injury. No significant stenosis of the brachiocephalic or subclavian arteries. CERVICAL CAROTID ARTERIES: There is mild calcific plaque present within the carotid bulbs bilaterally, with less than 20% stenosis bilaterally. The cervical segments of the internal carotid arteries are otherwise normal in caliber. No dissection or arterial injury. CERVICAL VERTEBRAL ARTERIES: The vertebral arteries are widely patent. No dissection, arterial injury, or significant stenosis. LUNGS AND  MEDIASTINUM: Unremarkable. SOFT TISSUES: No acute abnormality. BONES: No acute abnormality. CTA HEAD: ANTERIOR CIRCULATION: No significant stenosis of the internal carotid arteries. No significant stenosis of the anterior cerebral arteries. No significant stenosis of the middle cerebral arteries. No aneurysm. There is mild calcific plaque within the carotid siphons, but no appreciable stenosis. POSTERIOR CIRCULATION: No significant stenosis of the posterior cerebral arteries. No significant stenosis of the basilar artery. No significant stenosis of the vertebral arteries. No aneurysm. OTHER: No dural venous sinus thrombosis on this non-dedicated study. Please note the above findings were communicated to Dr. Voncile via the [facility] paging service at 11:35 am 01/11/2024. IMPRESSION: 1. No large vessel occlusion, hemodynamically significant stenosis, or aneurysm in the head or neck. 2. Mild calcific plaque within the carotid bulbs bilaterally, with less than 20% stenosis bilaterally. Electronically signed by: evalene coho 01/11/2024 11:41 AM EDT RP Workstation: [id number]   CT HEAD CODE STROKE WO CONTRAST Result Date: 01/11/2024 CLINICAL DATA:  Code stroke. EXAM: CT HEAD WITHOUT CONTRAST TECHNIQUE: Contiguous axial images were obtained from the base of the skull through the vertex without intravenous contrast. RADIATION DOSE REDUCTION: This exam was performed according to the departmental dose-optimization program which includes automated exposure control, adjustment of the mA and/or kV according to patient size and/or use of iterative reconstruction technique. COMPARISON:  None Available. FINDINGS: Brain: There is extensive chronic low-attenuation in the white matter Vascular: No dense vessel Skull: Normal. Negative for fracture or focal lesion. Sinuses/Orbits: No acute finding. Other: No hemorrhage ASPECTS (Alberta Stroke Program Early CT Score) - Ganglionic level infarction (caudate, lentiform nuclei, internal  capsule, insula, M1-M3 cortex): 7 - Supraganglionic infarction (M4-M6 cortex): 3 Total score (0-10 with 10 being normal): 10 IMPRESSION: 1. Extensive chronic white matter abnormalities. No dense vessel or hemorrhage. 2. ASPECTS is 10 Electronically Signed   By: Nancyann Burns M.D.   On: 01/11/2024 11:29    Cardiac Studies   ECHO:    1. Left ventricular ejection fraction, by estimation, is  60 to 65%. The  left ventricle has normal function. The left ventricle has no regional  wall motion abnormalities. There is mild concentric left ventricular  hypertrophy. Left ventricular diastolic  function could not be evaluated.   2. Right ventricular systolic function is severely reduced. The right  ventricular size is moderately enlarged. Tricuspid regurgitation signal is  inadequate for assessing PA pressure.   3. The mitral valve is normal in structure. No evidence of mitral valve  regurgitation. No evidence of mitral stenosis.   4. The aortic valve is calcified. There is moderate calcification of the  aortic valve. There is moderate thickening of the aortic valve. Aortic  valve regurgitation is mild. Aortic valve sclerosis/calcification is  present, without any evidence of aortic  stenosis. Aortic valve area, by VTI measures 2.70 cm. Aortic valve mean  gradient measures 2.0 mmHg. Aortic valve Vmax measures 0.93 m/s.    Patient Profile     89 y.o. male  with a hx of  hypertension, bradycardia who is being seen 01/11/2024 s/p out of hospital PEA arrest at the request of Dr. Sharyne.   Assessment & Plan    PE: He presented with cardiogenic shock and PEA arrest.  He was treated with thrombolytics and anticoagulation.  Elevated troponin: Consistent with PE.  No ischemia workup.  Right ventricular dysfunction: Secondary to strain.  Normal left ventricular function.  Plan repeat echo probably before discharge.  For questions or updates, please contact [facility] Please consult [email or web address]  for contact info under Cardiology/STEMI.   Signed, Lynwood Schilling, MD  01/13/2024, 8:00 AM

## 2024-01-13 NOTE — Progress Notes (Signed)
 [facility] ADULT ICU REPLACEMENT PROTOCOL   The patient does apply for the [facility] Adult ICU Electrolyte Replacment Protocol based on the criteria listed below:   1.Exclusion criteria: TCTS, ECMO, Dialysis, and Myasthenia Gravis patients 2. Is GFR >/= 30 ml/min? Yes.    Patient's GFR today is >60 3. Is SCr </= 2? Yes.   Patient's SCr is 0.91 mg/dL 4. Did SCr increase >/= 0.5 in 24 hours? No. 5.Pt's weight >40kg  Yes.   6. Abnormal electrolyte(s): potassium 3.6  7. Electrolytes replaced per protocol 8.  Call MD STAT for K+ </= 2.5, Phos </= 1, or Mag </= 1 Physician:  protocol  [place] JINNY Sharps 01/13/2024 3:53 AM

## 2024-01-13 NOTE — Discharge Instructions (Signed)
 Information on my medicine - ELIQUIS  (apixaban )  This medication education was reviewed with me or my healthcare representative as part of my discharge preparation.   Why was Eliquis  prescribed for you? Eliquis  was prescribed to treat blood clots that may have been found in the veins of your legs (deep vein thrombosis) or in your lungs (pulmonary embolism) and to reduce the risk of them occurring again.  What do You need to know about Eliquis  ? The starting dose is 10 mg (two 5 mg tablets) taken TWICE daily for the FIRST SEVEN (7) DAYS, then on Saturday 01/20/24  the dose is reduced to ONE 5 mg tablet taken TWICE daily.  Eliquis  may be taken with or without food.   Try to take the dose about the same time in the morning and in the evening. If you have difficulty swallowing the tablet whole please discuss with your pharmacist how to take the medication safely.  Take Eliquis  exactly as prescribed and DO NOT stop taking Eliquis  without talking to the doctor who prescribed the medication.  Stopping may increase your risk of developing a new blood clot.  Refill your prescription before you run out.  After discharge, you should have regular check-up appointments with your healthcare provider that is prescribing your Eliquis .    What do you do if you miss a dose? If a dose of ELIQUIS  is not taken at the scheduled time, take it as soon as possible on the same day and twice-daily administration should be resumed. The dose should not be doubled to make up for a missed dose.  Important Safety Information A possible side effect of Eliquis  is bleeding. You should call your healthcare provider right away if you experience any of the following: Bleeding from an injury or your nose that does not stop. Unusual colored urine (red or dark brown) or unusual colored stools (red or black). Unusual bruising for unknown reasons. A serious fall or if you hit your head (even if there is no bleeding).  Some  medicines may interact with Eliquis  and might increase your risk of bleeding or clotting while on Eliquis . To help avoid this, consult your healthcare provider or pharmacist prior to using any new prescription or non-prescription medications, including herbals, vitamins, non-steroidal anti-inflammatory drugs (NSAIDs) and supplements.  This website has more information on Eliquis  (apixaban ): http://[email or web address] .com/eliquis dena

## 2024-01-13 NOTE — Progress Notes (Signed)
 eLink Physician-Brief Progress Note Patient Name: Don Howard DOB: 1926/09/03 MRN: [id number]   Date of Service  01/13/2024  HPI/Events of Note  89 year old male with a history of hypertension, bradycardia, and PVCs, followed by cardiology, presented with PE s/p TPA. Foley in place  eICU Interventions  Continue Foley for now team to readdress LDA     Intervention Category Minor Interventions: Routine modifications to care plan (e.g. PRN medications for pain, fever)  Don Howard 01/13/2024, 2:53 AM

## 2024-01-13 NOTE — Plan of Care (Signed)

## 2024-01-14 DIAGNOSIS — I469 Cardiac arrest, cause unspecified: Secondary | ICD-10-CM | POA: Diagnosis not present

## 2024-01-14 MED ORDER — AMLODIPINE BESYLATE 5 MG PO TABS
5.0000 mg | ORAL_TABLET | Freq: Every day | ORAL | Status: DC
  Administered 2024-01-14 – 2024-01-17 (×7): 5 mg via ORAL
  Filled 2024-01-14 (×4): qty 1

## 2024-01-14 NOTE — Plan of Care (Signed)

## 2024-01-14 NOTE — Plan of Care (Signed)
   Problem: Education: Goal: Knowledge of General Education information will improve Description: Including pain rating scale, medication(s)/side effects and non-pharmacologic comfort measures Outcome: Progressing   Problem: Activity: Goal: Risk for activity intolerance will decrease Outcome: Progressing

## 2024-01-14 NOTE — Progress Notes (Signed)
 PROGRESS NOTE    Don Howard  FMW:[id number] DOB: 04/26/1927 DOA: 01/11/2024 PCP: Kip Righter, MD    Brief Narrative:  89 year old male with a history of hypertension, bradycardia, and PVCs, followed by cardiology, presented with PE s/p TPA    Assessment and Plan: AMS (present on admission-- resolved -CVA (ruled out)   Acute hypoxic respiratory failure due to PE status post half dose tPA currently on heparin  - now on elquis  - No clear provoking factor for PE will consider lifelong anticoagulation. -PT eval for dispo   RV strain/Obstructive shock/RV strain in the setting of massive PE.   -He will need a repeat echocardiogram in 30 days vs prior to d/c-- cardiology following   Transaminits (improving) - Likely due to hypotension  -trend   AKI  - Likely prerenal improved with hydration    Hyponatremia --mild   DVT prophylaxis: SCDs Start: 01/11/24 1427 apixaban  (ELIQUIS ) tablet 10 mg  apixaban  (ELIQUIS ) tablet 5 mg    Code Status: Limited: Do not attempt resuscitation (DNR) -DNR-LIMITED -Do Not Intubate/DNI  Family Communication:   Disposition Plan:  Level of care: Med-Surg Status is: Inpatient  PT eval-- patient lives at home so may need SNF     Consultants:  PCCM cards   Subjective: No SOB, no CP-- wants to get up  Objective: Vitals:   01/13/24 1951 01/13/24 2327 01/14/24 0402 01/14/24 0805  BP: 138/63 131/65 139/66 (!) 149/73  Pulse: (!) 59 62 61 65  Resp: 20 20 18 15   Temp: 98 F (36.7 C) 98.3 F (36.8 C) 98.5 F (36.9 C) (!) 97.5 F (36.4 C)  TempSrc: Oral Oral Oral Oral  SpO2: 96% 94% 95% 96%  Weight:   67.2 kg   Height:        Intake/Output Summary (Last 24 hours) at 01/14/2024 1056 Last data filed at 01/14/2024 9357 Gross per 24 hour  Intake 120 ml  Output 1950 ml  Net -1830 ml   Filed Weights   01/12/24 0500 01/13/24 0500 01/14/24 0402  Weight: 70.6 kg 73.3 kg 67.2 kg    Examination:   General: Appearance:    Well  developed, well nourished male in no acute distress     Lungs:     Clear to auscultation bilaterally, respirations unlabored  Heart:    Normal heart rate.    MS:   All extremities are intact.    Neurologic:   Awake, alert       Data Reviewed: I have personally reviewed following labs and imaging studies  CBC: Recent Labs  Lab 01/11/24 1109 01/11/24 1223 01/11/24 1853 01/12/24 0329 01/13/24 0304  WBC  --  22.1* 13.9* 9.8 8.7  NEUTROABS  --  18.4*  --   --   --   HGB 13.6 12.2* 11.8* 10.2* 10.0*  HCT 40.0 36.9* 35.4* 30.3* 29.4*  MCV  --  95.6 94.7 93.8 92.7  PLT  --  203 196 167 141*   Basic Metabolic Panel: Recent Labs  Lab 01/11/24 1109 01/11/24 1223 01/12/24 0329 01/13/24 0304  NA 134* 132* 138 132*  K 3.8 3.5 3.9 3.6  CL 100 99 106 105  CO2  --  18* 20* 20*  GLUCOSE 155* 190* 102* 107*  BUN 29* 28* 24* 20  CREATININE 1.50* 1.69* 1.29* 0.91  CALCIUM   --  7.9* 8.1* 7.8*  MG  --  2.2 1.8  --   PHOS  --   --  3.6  --  GFR: Estimated Creatinine Clearance: 46.2 mL/min (by C-G formula based on SCr of 0.91 mg/dL). Liver Function Tests: Recent Labs  Lab 01/11/24 1223 01/12/24 0329 01/13/24 0304  AST 219* 114* 80*  ALT 157* 111* 86*  ALKPHOS 50 44 44  BILITOT 1.8* 0.8 0.4  PROT 5.5* 4.6* 4.7*  ALBUMIN 2.9* 2.4* 2.3*   No results for input(s): LIPASE, AMYLASE in the last 168 hours. No results for input(s): AMMONIA in the last 168 hours. Coagulation Profile: Recent Labs  Lab 01/11/24 1106 01/11/24 1853  INR 1.2 1.2   Cardiac Enzymes: No results for input(s): CKTOTAL, CKMB, CKMBINDEX, TROPONINI in the last 168 hours. BNP (last 3 results) No results for input(s): PROBNP in the last 8760 hours. HbA1C: No results for input(s): HGBA1C in the last 72 hours. CBG: Recent Labs  Lab 01/12/24 1508 01/12/24 1917 01/12/24 2314 01/13/24 0311 01/13/24 0801  GLUCAP 119* 118* 110* 97 105*   Lipid Profile: No results for input(s):  CHOL, HDL, LDLCALC, TRIG, CHOLHDL, LDLDIRECT in the last 72 hours. Thyroid  Function Tests: No results for input(s): TSH, T4TOTAL, FREET4, T3FREE, THYROIDAB in the last 72 hours. Anemia Panel: No results for input(s): VITAMINB12, FOLATE, FERRITIN, TIBC, IRON, RETICCTPCT in the last 72 hours. Sepsis Labs: Recent Labs  Lab 01/11/24 1256 01/11/24 1652 01/11/24 1853  PROCALCITON  --  1.88  --   LATICACIDVEN 3.2* 3.1* 3.0*    Recent Results (from the past 240 hours)  Resp panel by RT-PCR (RSV, Flu A&B, Covid) Anterior Nasal Swab     Status: None   Collection Time: 01/11/24 12:23 PM   Specimen: Anterior Nasal Swab  Result Value Ref Range Status   SARS Coronavirus 2 by RT PCR NEGATIVE NEGATIVE Final   Influenza A by PCR NEGATIVE NEGATIVE Final   Influenza B by PCR NEGATIVE NEGATIVE Final    Comment: (NOTE) The Xpert Xpress SARS-CoV-2/FLU/RSV plus assay is intended as an aid in the diagnosis of influenza from Nasopharyngeal swab specimens and should not be used as a sole basis for treatment. Nasal washings and aspirates are unacceptable for Xpert Xpress SARS-CoV-2/FLU/RSV testing.  Fact Sheet for Patients: BloggerCourse.com  Fact Sheet for Healthcare Providers: SeriousBroker.it  This test is not yet approved or cleared by the [place]  FDA and has been authorized for detection and/or diagnosis of SARS-CoV-2 by FDA under an Emergency Use Authorization (EUA). This EUA will remain in effect (meaning this test can be used) for the duration of the COVID-19 declaration under Section 564(b)(1) of the Act, 21 U.S.C. section 360bbb-3(b)(1), unless the authorization is terminated or revoked.     Resp Syncytial Virus by PCR NEGATIVE NEGATIVE Final    Comment: (NOTE) Fact Sheet for Patients: BloggerCourse.com  Fact Sheet for Healthcare  Providers: SeriousBroker.it  This test is not yet approved or cleared by the [place]  FDA and has been authorized for detection and/or diagnosis of SARS-CoV-2 by FDA under an Emergency Use Authorization (EUA). This EUA will remain in effect (meaning this test can be used) for the duration of the COVID-19 declaration under Section 564(b)(1) of the Act, 21 U.S.C. section 360bbb-3(b)(1), unless the authorization is terminated or revoked.  Performed at [facility], [address] [place], [place] [address]   Blood Culture (routine x 2)     Status: None (Preliminary result)   Collection Time: 01/11/24 12:23 PM   Specimen: BLOOD  Result Value Ref Range Status   Specimen Description BLOOD SITE NOT SPECIFIED  Final   Special  Requests   Final    BOTTLES DRAWN AEROBIC AND ANAEROBIC Blood Culture adequate volume   Culture   Final    NO GROWTH 2 DAYS Performed at [facility], [address], [place], [place] [address]    Report Status PENDING  Incomplete  Blood Culture (routine x 2)     Status: None (Preliminary result)   Collection Time: 01/11/24 12:35 PM   Specimen: BLOOD  Result Value Ref Range Status   Specimen Description BLOOD SITE NOT SPECIFIED  Final   Special Requests   Final    BOTTLES DRAWN AEROBIC AND ANAEROBIC Blood Culture adequate volume   Culture   Final    NO GROWTH 2 DAYS Performed at [facility], [address], [place], [place] [address]    Report Status PENDING  Incomplete  MRSA Next Gen by PCR, Nasal     Status: None   Collection Time: 01/11/24  4:05 PM   Specimen: Nasal Mucosa; Nasal Swab  Result Value Ref Range Status   MRSA by PCR Next Gen NOT DETECTED NOT DETECTED Final    Comment: (NOTE) The GeneXpert MRSA Assay (FDA approved for NASAL specimens only), is one component of a comprehensive MRSA colonization surveillance program. It is not intended to diagnose MRSA infection nor to guide or monitor  treatment for MRSA infections. Test performance is not FDA approved in patients less than 21 years old. Performed at [facility], [address] [place], [place] [address]          Radiology Studies: DG CHEST PORT 1 VIEW Result Date: 01/13/2024 CLINICAL DATA:  Fever. EXAM: PORTABLE CHEST 1 VIEW COMPARISON:  Radiograph and CT 01/11/2024 FINDINGS: Low lung volumes persist. Increased opacity in the right infrahilar lung with small right pleural effusion. There is also a small left pleural effusion with ill-defined basilar opacity. Stable heart size and mediastinal contours. No pneumothorax. No pulmonary edema. IMPRESSION: 1. Bibasilar opacities, right greater than left are new from prior exam. This may represent pneumonia or aspiration. 2. Small pleural effusions. Electronically Signed   By: Andrea Gasman M.D.   On: 01/13/2024 11:10   CT HEAD WO CONTRAST ( ) Result Date: 01/12/2024 CLINICAL DATA:  Stroke, follow up EXAM: CT HEAD WITHOUT CONTRAST TECHNIQUE: Contiguous axial images were obtained from the base of the skull through the vertex without intravenous contrast. RADIATION DOSE REDUCTION: This exam was performed according to the departmental dose-optimization program which includes automated exposure control, adjustment of the mA and/or kV according to patient size and/or use of iterative reconstruction technique. COMPARISON:  CT head 01/11/2024 FINDINGS: Brain: Patchy and confluent areas of decreased attenuation are noted throughout the deep and periventricular white matter of the cerebral hemispheres bilaterally, compatible with chronic microvascular ischemic disease. No evidence of large-territorial acute infarction. No parenchymal hemorrhage. No mass lesion. No extra-axial collection. No mass effect or midline shift. No hydrocephalus. Basilar cisterns are patent. Vascular: No hyperdense vessel. Skull: No acute fracture or focal lesion. Sinuses/Orbits: Paranasal sinuses and mastoid  air cells are clear. Bilateral lens replacement. Otherwise the orbits are unremarkable. Other: None. IMPRESSION: No acute intracranial abnormality. Electronically Signed   By: Morgane  Naveau M.D.   On: 01/12/2024 21:08   VAS US  LOWER EXTREMITY VENOUS (DVT) Result Date: 01/12/2024  Lower Venous DVT Study Patient Name:  AZZAM MEHRA Defrank  Date of Exam:   01/12/2024 Medical Rec #: [id number]       Accession #:    [id number] Date  of Birth: [birth year]        Patient Gender: M Patient Age:   29 years Exam Location:  [facility] Procedure:      VAS US  LOWER EXTREMITY VENOUS (DVT) Referring Phys: SAMMI GORE --------------------------------------------------------------------------------  Indications: Pulmonary embolism.  Comparison Study: No previous exams Performing Technologist: Jody Hill RVT, RDMS  Examination Guidelines: A complete evaluation includes B-mode imaging, spectral Doppler, color Doppler, and power Doppler as needed of all accessible portions of each vessel. Bilateral testing is considered an integral part of a complete examination. Limited examinations for reoccurring indications may be performed as noted. The reflux portion of the exam is performed with the patient in reverse Trendelenburg.  +---------+---------------+---------+-----------+----------+--------------+ RIGHT    CompressibilityPhasicitySpontaneityPropertiesThrombus Aging +---------+---------------+---------+-----------+----------+--------------+ CFV      Full           Yes      Yes                                 +---------+---------------+---------+-----------+----------+--------------+ SFJ      Full                                                        +---------+---------------+---------+-----------+----------+--------------+ FV Prox  Full           Yes      Yes                                 +---------+---------------+---------+-----------+----------+--------------+ FV Mid   Full           Yes      Yes                                  +---------+---------------+---------+-----------+----------+--------------+ FV DistalFull           Yes      Yes                                 +---------+---------------+---------+-----------+----------+--------------+ PFV      Full                                                        +---------+---------------+---------+-----------+----------+--------------+ POP      Full           Yes      Yes                               +---------+---------------+---------+-----------+----------+--------------+ PTV      Full                                                        +---------+---------------+---------+-----------+----------+--------------+ PERO     Full                                                        +---------+---------------+---------+-----------+----------+--------------+  limited visualization due to mobility limitations  +---------+---------------+---------+-----------+----------+--------------+ LEFT     CompressibilityPhasicitySpontaneityPropertiesThrombus Aging +---------+---------------+---------+-----------+----------+--------------+ CFV      Full           Yes      Yes                                 +---------+---------------+---------+-----------+----------+--------------+ SFJ      Full                                                        +---------+---------------+---------+-----------+----------+--------------+ FV Prox  Full           Yes      Yes                                 +---------+---------------+---------+-----------+----------+--------------+ FV Mid   Full           Yes      Yes                                 +---------+---------------+---------+-----------+----------+--------------+ FV DistalFull           Yes      Yes                                 +---------+---------------+---------+-----------+----------+--------------+ PFV      Full                                                         +---------+---------------+---------+-----------+----------+--------------+ POP      Full           Yes      Yes                                 +---------+---------------+---------+-----------+----------+--------------+ PTV      Full                                                        +---------+---------------+---------+-----------+----------+--------------+ PERO     Full                                                        +---------+---------------+---------+-----------+----------+--------------+     Summary: BILATERAL: - No evidence of deep vein thrombosis seen in the lower extremities, bilaterally. - RIGHT: - No cystic structure found in the popliteal fossa.  LEFT: - A cystic structure is found in the popliteal fossa 4.19 x 0.87 x 2.81 cm.  *See table(s) above for measurements and observations. Electronically  signed by Norman Serve on 01/12/2024 at 4:41:28 PM.    Final         Scheduled Meds:  apixaban   10 mg Oral BID   Followed by   NOREEN ON 01/20/2024] apixaban   5 mg Oral BID   aspirin   81 mg Oral Daily   Chlorhexidine  Gluconate Cloth  6 each Topical Daily   Continuous Infusions:   LOS: 3 days    Time spent: 45 minutes spent on chart review, discussion with nursing staff, consultants, updating family and interview/physical exam; more than 50% of that time was spent in counseling and/or coordination of care.    Harlene RAYMOND Bowl, DO [facility] Available via Epic secure chat 7am-7pm After these hours, please refer to coverage provider listed on [email or web address] 01/14/2024, 10:56 AM

## 2024-01-15 DIAGNOSIS — I469 Cardiac arrest, cause unspecified: Secondary | ICD-10-CM | POA: Diagnosis not present

## 2024-01-15 LAB — BASIC METABOLIC PANEL WITH GFR
Anion gap: 9 (ref 5–15)
BUN: 9 mg/dL (ref 8–23)
CO2: 23 mmol/L (ref 22–32)
Calcium: 8.2 mg/dL — ABNORMAL LOW (ref 8.9–10.3)
Chloride: 102 mmol/L (ref 98–111)
Creatinine, Ser: 0.78 mg/dL (ref 0.61–1.24)
GFR, Estimated: >60 mL/min (ref >60)
Glucose, Bld: 95 mg/dL (ref 70–99)
Potassium: 4.1 mmol/L (ref 3.5–5.1)
Sodium: 134 mmol/L — ABNORMAL LOW (ref 135–145)

## 2024-01-15 LAB — CBC
HCT: 33.3 % — ABNORMAL LOW (ref 39.0–52.0)
Hemoglobin: 11.5 g/dL — ABNORMAL LOW (ref 13.0–17.0)
MCH: 31.9 pg (ref 26.0–34.0)
MCHC: 34.5 g/dL (ref 30.0–36.0)
MCV: 92.2 fL (ref 80.0–100.0)
Platelets: 260 K/uL (ref 150–400)
RBC: 3.61 MIL/uL — ABNORMAL LOW (ref 4.22–5.81)
RDW: 12.1 % (ref 11.5–15.5)
WBC: 8.2 K/uL (ref 4.0–10.5)
nRBC: 0 % (ref 0.0–0.2)

## 2024-01-15 NOTE — Plan of Care (Signed)
   Problem: Education: Goal: Knowledge of General Education information will improve Description Including pain rating scale, medication(s)/side effects and non-pharmacologic comfort measures Outcome: Progressing

## 2024-01-15 NOTE — Progress Notes (Addendum)
 PROGRESS NOTE    Don Howard  FMW:[id number] DOB: 1926/06/24 DOA: 01/11/2024 PCP: Kip Righter, MD    Brief Narrative:  89 year old male with a history of hypertension, bradycardia, and PVCs, followed by cardiology, presented with PE s/p TPA    Assessment and Plan: AMS (present on admission-- resolved) -CVA (ruled out)   Acute hypoxic respiratory failure due to PE status post half dose tPA currently on heparin  - now on elquis  - No clear provoking factor for PE will consider lifelong anticoagulation. -PT eval for dispo   RV strain/Obstructive shock/RV strain in the setting of massive PE.   -He will need a repeat echocardiogram as an outpatient    Transaminits (improving) - Likely due to hypotension  -trend   AKI  - Likely prerenal improved with hydration    Hyponatremia --mild   DVT prophylaxis: SCDs Start: 01/11/24 1427 apixaban  (ELIQUIS ) tablet 10 mg  apixaban  (ELIQUIS ) tablet 5 mg    Code Status: Limited: Do not attempt resuscitation (DNR) -DNR-LIMITED -Do Not Intubate/DNI  Family Communication:   Disposition Plan:  Level of care: Med-Surg Status is: Inpatient  PT eval-- patient lives at home so may need SNF     Consultants:  PCCM cards   Subjective: No overnight events  Objective: Vitals:   01/14/24 2041 01/15/24 0453 01/15/24 0804 01/15/24 1018  BP: (!) 140/60 129/71 136/65 136/65  Pulse: 64 63 (!) 59   Resp: 16 17 16    Temp: 98 F (36.7 C) 98.8 F (37.1 C) 98.5 F (36.9 C)   TempSrc: Oral Oral Oral   SpO2: 96% 96% 95%   Weight:      Height:        Intake/Output Summary (Last 24 hours) at 01/15/2024 1037 Last data filed at 01/14/2024 2303 Gross per 24 hour  Intake 360 ml  Output 1425 ml  Net -1065 ml   Filed Weights   01/12/24 0500 01/13/24 0500 01/14/24 0402  Weight: 70.6 kg 73.3 kg 67.2 kg    Examination:   General: Appearance:    Well developed, well nourished male in no acute distress     Lungs:     respirations  unlabored  Heart:    Bradycardic.    MS:   All extremities are intact.    Neurologic:   Awake, alert       Data Reviewed: I have personally reviewed following labs and imaging studies  CBC: Recent Labs  Lab 01/11/24 1223 01/11/24 1853 01/12/24 0329 01/13/24 0304 01/15/24 0736  WBC 22.1* 13.9* 9.8 8.7 8.2  NEUTROABS 18.4*  --   --   --   --   HGB 12.2* 11.8* 10.2* 10.0* 11.5*  HCT 36.9* 35.4* 30.3* 29.4* 33.3*  MCV 95.6 94.7 93.8 92.7 92.2  PLT 203 196 167 141* 260   Basic Metabolic Panel: Recent Labs  Lab 01/11/24 1109 01/11/24 1223 01/12/24 0329 01/13/24 0304 01/15/24 0736  NA 134* 132* 138 132* 134*  K 3.8 3.5 3.9 3.6 4.1  CL 100 99 106 105 102  CO2  --  18* 20* 20* 23  GLUCOSE 155* 190* 102* 107* 95  BUN 29* 28* 24* 20 9  CREATININE 1.50* 1.69* 1.29* 0.91 0.78  CALCIUM   --  7.9* 8.1* 7.8* 8.2*  MG  --  2.2 1.8  --   --   PHOS  --   --  3.6  --   --    GFR: Estimated Creatinine Clearance: 52.5 mL/min (by C-G formula based  on SCr of 0.78 mg/dL). Liver Function Tests: Recent Labs  Lab 01/11/24 1223 01/12/24 0329 01/13/24 0304  AST 219* 114* 80*  ALT 157* 111* 86*  ALKPHOS 50 44 44  BILITOT 1.8* 0.8 0.4  PROT 5.5* 4.6* 4.7*  ALBUMIN 2.9* 2.4* 2.3*   No results for input(s): LIPASE, AMYLASE in the last 168 hours. No results for input(s): AMMONIA in the last 168 hours. Coagulation Profile: Recent Labs  Lab 01/11/24 1106 01/11/24 1853  INR 1.2 1.2   Cardiac Enzymes: No results for input(s): CKTOTAL, CKMB, CKMBINDEX, TROPONINI in the last 168 hours. BNP (last 3 results) No results for input(s): PROBNP in the last 8760 hours. HbA1C: No results for input(s): HGBA1C in the last 72 hours. CBG: Recent Labs  Lab 01/12/24 1508 01/12/24 1917 01/12/24 2314 01/13/24 0311 01/13/24 0801  GLUCAP 119* 118* 110* 97 105*   Lipid Profile: No results for input(s): CHOL, HDL, LDLCALC, TRIG, CHOLHDL, LDLDIRECT in the last 72  hours. Thyroid  Function Tests: No results for input(s): TSH, T4TOTAL, FREET4, T3FREE, THYROIDAB in the last 72 hours. Anemia Panel: No results for input(s): VITAMINB12, FOLATE, FERRITIN, TIBC, IRON, RETICCTPCT in the last 72 hours. Sepsis Labs: Recent Labs  Lab 01/11/24 1256 01/11/24 1652 01/11/24 1853  PROCALCITON  --  1.88  --   LATICACIDVEN 3.2* 3.1* 3.0*    Recent Results (from the past 240 hours)  Resp panel by RT-PCR (RSV, Flu A&B, Covid) Anterior Nasal Swab     Status: None   Collection Time: 01/11/24 12:23 PM   Specimen: Anterior Nasal Swab  Result Value Ref Range Status   SARS Coronavirus 2 by RT PCR NEGATIVE NEGATIVE Final   Influenza A by PCR NEGATIVE NEGATIVE Final   Influenza B by PCR NEGATIVE NEGATIVE Final    Comment: (NOTE) The Xpert Xpress SARS-CoV-2/FLU/RSV plus assay is intended as an aid in the diagnosis of influenza from Nasopharyngeal swab specimens and should not be used as a sole basis for treatment. Nasal washings and aspirates are unacceptable for Xpert Xpress SARS-CoV-2/FLU/RSV testing.  Fact Sheet for Patients: BloggerCourse.com  Fact Sheet for Healthcare Providers: SeriousBroker.it  This test is not yet approved or cleared by the [place]  FDA and has been authorized for detection and/or diagnosis of SARS-CoV-2 by FDA under an Emergency Use Authorization (EUA). This EUA will remain in effect (meaning this test can be used) for the duration of the COVID-19 declaration under Section 564(b)(1) of the Act, 21 U.S.C. section 360bbb-3(b)(1), unless the authorization is terminated or revoked.     Resp Syncytial Virus by PCR NEGATIVE NEGATIVE Final    Comment: (NOTE) Fact Sheet for Patients: BloggerCourse.com  Fact Sheet for Healthcare Providers: SeriousBroker.it  This test is not yet approved or cleared by the [place] FDA and has been authorized for detection and/or diagnosis of SARS-CoV-2 by FDA under an Emergency Use Authorization (EUA). This EUA will remain in effect (meaning this test can be used) for the duration of the COVID-19 declaration under Section 564(b)(1) of the Act, 21 U.S.C. section 360bbb-3(b)(1), unless the authorization is terminated or revoked.  Performed at [facility], [address] [place], [place] [address]   Blood Culture (routine x 2)     Status: None (Preliminary result)   Collection Time: 01/11/24 12:23 PM   Specimen: BLOOD  Result Value Ref Range Status   Specimen Description BLOOD SITE NOT SPECIFIED  Final   Special Requests   Final    BOTTLES DRAWN AEROBIC  AND ANAEROBIC Blood Culture adequate volume   Culture   Final    NO GROWTH 4 DAYS Performed at [facility], [address], [place], [place] [address]    Report Status PENDING  Incomplete  Blood Culture (routine x 2)     Status: None (Preliminary result)   Collection Time: 01/11/24 12:35 PM   Specimen: BLOOD  Result Value Ref Range Status   Specimen Description BLOOD SITE NOT SPECIFIED  Final   Special Requests   Final    BOTTLES DRAWN AEROBIC AND ANAEROBIC Blood Culture adequate volume   Culture   Final    NO GROWTH 4 DAYS Performed at [facility], [address], [place], [place] [address]    Report Status PENDING  Incomplete  MRSA Next Gen by PCR, Nasal     Status: None   Collection Time: 01/11/24  4:05 PM   Specimen: Nasal Mucosa; Nasal Swab  Result Value Ref Range Status   MRSA by PCR Next Gen NOT DETECTED NOT DETECTED Final    Comment: (NOTE) The GeneXpert MRSA Assay (FDA approved for NASAL specimens only), is one component of a comprehensive MRSA colonization surveillance program. It is not intended to diagnose MRSA infection nor to guide or monitor treatment for MRSA infections. Test performance is not FDA approved in patients less than 21 years old. Performed at  [facility], [address], [place], [place] [address]          Radiology Studies: No results found.       Scheduled Meds:  amLODipine   5 mg Oral Daily   apixaban   10 mg Oral BID   Followed by   NOREEN ON 01/20/2024] apixaban   5 mg Oral BID   aspirin   81 mg Oral Daily   Chlorhexidine  Gluconate Cloth  6 each Topical Daily   Continuous Infusions:   LOS: 4 days    Time spent: 45 minutes spent on chart review, discussion with nursing staff, consultants, updating family and interview/physical exam; more than 50% of that time was spent in counseling and/or coordination of care.    Harlene RAYMOND Bowl, DO [facility] Available via Epic secure chat 7am-7pm After these hours, please refer to coverage provider listed on [email or web address] 01/15/2024, 10:37 AM

## 2024-01-15 NOTE — Progress Notes (Signed)
 Mobility Specialist Progress Note:    01/15/24 1500  Mobility  Activity Pivoted/transferred from chair to bed  Level of Assistance Contact guard assist, steadying assist  Assistive Device None  Distance Ambulated (ft) 5 ft  Activity Response Tolerated well  Mobility Referral Yes  Mobility visit 1 Mobility  Mobility Specialist Start Time (ACUTE ONLY) 1500  Mobility Specialist Stop Time (ACUTE ONLY) 1515  Mobility Specialist Time Calculation (min) (ACUTE ONLY) 15 min   Received pt requesting to ambulate to the bed. Pt politely declined assistance, stating preference to complete task independently. No c/o. Left pt in bed with alarm on. Personal belongings and call light within reach. All needs met.  Lavanda Pollack Mobility Specialist  Please contact via [employer] or  Rehab Office [phone]

## 2024-01-15 NOTE — Progress Notes (Addendum)
  Progress Note  Patient Name: CARDELL RACHEL Date of Encounter: 01/15/2024 [facility] Cardiologist: Maude Emmer, MD   Interval Summary   Doing well this morning No acute complaints  Mainly concerned with getting washed up this morning   Vital Signs Vitals:   01/14/24 1709 01/14/24 2041 01/15/24 0453 01/15/24 0804  BP: 128/76 (!) 140/60 129/71 136/65  Pulse: 65 64 63 (!) 59  Resp: 17 16 17 16   Temp: 97.9 F (36.6 C) 98 F (36.7 C) 98.8 F (37.1 C) 98.5 F (36.9 C)  TempSrc: Oral Oral Oral Oral  SpO2: 95% 96% 96% 95%  Weight:      Height:        Intake/Output Summary (Last 24 hours) at 01/15/2024 0905 Last data filed at 01/14/2024 2303 Gross per 24 hour  Intake 360 ml  Output 1425 ml  Net -1065 ml      01/14/2024    4:02 AM 01/13/2024    5:00 AM 01/12/2024    5:00 AM  Last 3 Weights  Weight (lbs) 148 lb 2.4 oz 161 lb 9.6 oz 155 lb 10.3 oz  Weight (kg) 67.2 kg 73.3 kg 70.6 kg     Telemetry/ECG  N/A - Personally Reviewed  Physical Exam  GEN: No acute distress.   Neck: No JVD Cardiac: RRR, no murmurs, rubs, or gallops.  Respiratory: Clear to auscultation bilaterally. GI: Soft, nontender, non-distended  MS: No edema  Assessment & Plan  Pulmonary embolism  PEA arrest Cardiogenic shock Presented with cardiogenic shock, transient PEA arrest due to large volume PE Treated with thrombolytics and anticoagulation Continue with Eliquis  permanently -- on 10 mg BID until 8/15, then 5 mg BID Follow up with general cardiology arranged 8/25  Elevated troponin  246 > 1,539 > 1,227 Consistent with PE No ischemic workup at this time   RV dysfunction  Echo showed EF 60-65%, severely reduced RV function, moderately enlarged RV Secondary to right heart strain Vitals have been stable  Plan for repeat echocardiogram as outpatient   Per primary AMS AKI Electrolyte disturbances     For questions or updates, please contact [facility] Please consult  [email or web address] for contact info under       Signed, Waddell DELENA Donath, PA-C   [facility] will sign off.   Medication Recommendations:  As on MAR Other recommendations (labs, testing, etc):  NA Follow up as an outpatient:  Follow up scheduled

## 2024-01-15 NOTE — Progress Notes (Signed)

## 2024-01-15 NOTE — Evaluation (Signed)
 Physical Therapy Evaluation  Patient Details Name: Don Howard MRN: [id number] DOB: Oct 20, 1926 Today's Date: 01/15/2024  History of Present Illness  Pt is a 89 y/o male who presents 01/11/2024 via EMS. He was admitted with PE s/p TPA, PEA arrest, and cardiogenic shock. PMH significant for HTN.  Clinical Impression  Pt admitted with above diagnosis. Pt currently with functional limitations due to the deficits listed below (see PT Problem List). At the time of PT eval pt was highly motivated to mobilize without assistance. Pt initially declining PT assist or AD. As session progressed, PT insisted on use of gait belt and HHA to decrease risk of falls, as pt heavily reliant on external support. Pt agreeable to using RW next session. It appears that pt is mostly limited by R knee pain, which pt states is baseline, however later in session reports R knee is worse now than it has been, and it seems that pt has had a gross decline in function outside of the R knee deficits. Given independent PLOF and motivation to participate, recommend >3 hours/day of multidisciplinary rehab to maximize functional return and facilitate safe return home with support from his adult grandchildren. Pt will benefit from acute skilled PT to increase their independence and safety with mobility to allow discharge.           If plan is discharge home, recommend the following: A lot of help with walking and/or transfers;A lot of help with bathing/dressing/bathroom;Assistance with cooking/housework;Assist for transportation;Help with stairs or ramp for entrance   Can travel by private vehicle   Yes    Equipment Recommendations Other (comment) (tub bench)  Recommendations for Other Services  OT consult    Functional Status Assessment Patient has had a recent decline in their functional status and demonstrates the ability to make significant improvements in function in a reasonable and predictable amount of time.      Precautions / Restrictions Precautions Precautions: Fall Recall of Precautions/Restrictions: Impaired Precaution/Restrictions Comments: Poor safety awareness, does  not like hands on assist. Wants to do it himself without an AD but at a very high risk of falls. Restrictions Weight Bearing Restrictions Per Provider Order: No      Mobility  Bed Mobility Overal bed mobility: Needs Assistance Bed Mobility: Supine to Sit     Supine to sit: Supervision     General bed mobility comments: Pt motivated to complete supine>sit without assistance. Increased time and effort required with heavy use of rail to pull trunk into sitting position.    Transfers Overall transfer level: Needs assistance Equipment used: 1 person hand held assist Transfers: Sit to/from Stand Sit to Stand: Contact guard assist           General transfer comment: Pt insisting that PT not provide physical assistance to stand. Increased time and significant effort required to power up to full stand. Once standing, pt with fair standing balance statically. Close guard provided for safety, and hands on assist provided once pt began to move, as he was very unsteady with dynamic movement.    Ambulation/Gait Ambulation/Gait assistance: Min assist Gait Distance (Feet): 6 Feet Assistive device: 1 person hand held assist Gait Pattern/deviations: Step-to pattern, Decreased stride length, Decreased weight shift to right, Decreased stance time - right, Decreased step length - right, Trunk flexed, Narrow base of support Gait velocity: Decreased Gait velocity interpretation: <1.31 ft/sec, indicative of household ambulator   General Gait Details: Pt with heavy reliance on UE's for support, reaching for bedside table  and other furniture to brace himself on. Declining PT assist and RW, however due to extreme risk of falling, PT insisted on HHA and gait belt. Pt agreeable to using RW next session.  Stairs             Wheelchair Mobility     Tilt Bed    Modified Rankin (Stroke Patients Only)       Balance Overall balance assessment: Needs assistance Sitting-balance support: Feet supported, No upper extremity supported Sitting balance-Leahy Scale: Fair     Standing balance support: No upper extremity supported, During functional activity Standing balance-Leahy Scale: Poor                               Pertinent Vitals/Pain Pain Assessment Pain Assessment: Faces Faces Pain Scale: Hurts even more Pain Location: R knee Pain Descriptors / Indicators: Sore, Aching (arthritis pain) Pain Intervention(s): Limited activity within patient's tolerance, Monitored during session, Repositioned    Home Living Family/patient expects to be discharged to:: Private residence Living Arrangements: Alone Available Help at Discharge: Family;Available PRN/intermittently Type of Home: House Home Access: Stairs to enter Entrance Stairs-Rails: Right Entrance Stairs-Number of Steps: 2   Home Layout: One level Home Equipment: Grab bars - tub/shower;Cane - single Librarian, academic (2 wheels);Crutches      Prior Function Prior Level of Function : Independent/Modified Independent                     Extremity/Trunk Assessment   Upper Extremity Assessment Upper Extremity Assessment: Generalized weakness;Overall WFL for tasks assessed (Age appropriate strength)    Lower Extremity Assessment Lower Extremity Assessment: RLE deficits/detail RLE Deficits / Details: Pt reports baseline arthritis in his R knee that limits him. Pt with difficulty weight bearing through the R knee due to pain this date. RLE: Unable to fully assess due to pain    Cervical / Trunk Assessment Cervical / Trunk Assessment: Kyphotic (Forward head posture with rounded shoulders)  Communication   Communication Communication: Impaired Factors Affecting Communication: Hearing impaired    Cognition Arousal:  Alert Behavior During Therapy: WFL for tasks assessed/performed   PT - Cognitive impairments: Safety/Judgement, Memory                         Following commands: Intact       Cueing Cueing Techniques: Verbal cues, Gestural cues, Tactile cues     General Comments      Exercises     Assessment/Plan    PT Assessment Patient needs continued PT services  PT Problem List Decreased strength;Decreased range of motion;Decreased activity tolerance;Decreased balance;Decreased mobility;Decreased cognition;Decreased knowledge of use of DME;Decreased safety awareness;Decreased knowledge of precautions;Pain       PT Treatment Interventions DME instruction;Gait training;Stair training;Functional mobility training;Therapeutic activities;Therapeutic exercise;Balance training;Neuromuscular re-education;Cognitive remediation;Patient/family education    PT Goals (Current goals can be found in the Care Plan section)  Acute Rehab PT Goals Patient Stated Goal: Home at d/c - back to living alone. Reports he does not feel he needs further rehab PT Goal Formulation: With patient Time For Goal Achievement: 01/29/24 Potential to Achieve Goals: Good    Frequency Min 2X/week     Co-evaluation               AM-PAC PT 6 Clicks Mobility  Outcome Measure Help needed turning from your back to your side while in a flat bed without using bedrails?:  A Little Help needed moving from lying on your back to sitting on the side of a flat bed without using bedrails?: A Little Help needed moving to and from a bed to a chair (including a wheelchair)?: A Little Help needed standing up from a chair using your arms (e.g., wheelchair or bedside chair)?: A Little Help needed to walk in hospital room?: Total Help needed climbing 3-5 steps with a railing? : Total 6 Click Score: 14    End of Session Equipment Utilized During Treatment: Gait belt Activity Tolerance: Patient tolerated treatment  well Patient left: in chair;with call bell/phone within reach;with chair alarm set Nurse Communication: Mobility status PT Visit Diagnosis: Unsteadiness on feet (R26.81);Pain Pain - Right/Left: Right Pain - part of body: Knee    Time: 8886-8858 PT Time Calculation (min) (ACUTE ONLY): 28 min   Charges:   PT Evaluation $PT Eval Moderate Complexity: 1 Mod PT Treatments $Gait Training: 8-22 mins PT General Charges $$ ACUTE PT VISIT: 1 Visit         Leita Sable, PT, DPT Acute Rehabilitation Services Secure Chat Preferred Office: [phone]   Leita JONETTA Sable 01/15/2024, 1:42 PM

## 2024-01-16 DIAGNOSIS — R5381 Other malaise: Secondary | ICD-10-CM

## 2024-01-16 DIAGNOSIS — I469 Cardiac arrest, cause unspecified: Secondary | ICD-10-CM | POA: Diagnosis not present

## 2024-01-16 LAB — CULTURE, BLOOD (ROUTINE X 2)
Culture: NO GROWTH
Culture: NO GROWTH
Special Requests: ADEQUATE
Special Requests: ADEQUATE

## 2024-01-16 NOTE — Plan of Care (Signed)
  Problem: Pain Managment: Goal: General experience of comfort will improve and/or be controlled Outcome: Progressing   Problem: Safety: Goal: Ability to remain free from injury will improve Outcome: Progressing   Problem: Skin Integrity: Goal: Risk for impaired skin integrity will decrease Outcome: Progressing

## 2024-01-16 NOTE — Progress Notes (Signed)
 Mobility Specialist Progress Note:   01/16/24 1053  Mobility  Activity Ambulated with assistance (To BR)  Level of Assistance Minimal assist, patient does 75% or more  Assistive Device Front wheel walker  Distance Ambulated (ft) 10 ft  Activity Response Tolerated well  Mobility Referral Yes  Mobility visit 1 Mobility  Mobility Specialist Start Time (ACUTE ONLY) 1011  Mobility Specialist Stop Time (ACUTE ONLY) 1027  Mobility Specialist Time Calculation (min) (ACUTE ONLY) 16 min   Received pt in bed and agreeable to mobility. Pt requested to walk to the BR. Pt required MinA STS, otherwise contact guard. Pt left in BR and instructed to pull call light when finished. All needs met. NT aware.  Lavanda Pollack Mobility Specialist  Please contact via [employer] or  Rehab Office [phone]

## 2024-01-16 NOTE — Plan of Care (Signed)

## 2024-01-16 NOTE — Consult Note (Signed)
 Physical Medicine and Rehabilitation Consult Reason for Consult:impaired functional mobility Referring Physician: Juvenal   HPI: Don Howard is a 89 y.o. male with a history of hypertension who presented on 01/11/2024 after feeling poorly for a few days.  Patient quickly decompensated and went into PEA.  He was found to have a massive pulmonary embolus for which he received tPA and started on heparin  drip.  Patient also developed left-sided gaze preference.  Stroke workup was negative.  Patient with right ventricular strain due to the massive pulmonary embolus.  Patient has been showing improvement over the last 2 days.  He was transitioned over to Eliquis  without issue thus far.  Patient was up with therapy yesterday and transferred sit to stand contact-guard assist and walked 6 feet min assist with 1 person holding his hand.  Patient lives alone but apparently does have some family available.  He was modified independent prior to this admission.    Home: Home Living Family/patient expects to be discharged to:: Private residence Living Arrangements: Alone Available Help at Discharge: Family, Available PRN/intermittently Type of Home: House Home Access: Stairs to enter Secretary/administrator of Steps: 2 Entrance Stairs-Rails: Right Home Layout: One level Bathroom Shower/Tub: Tub/shower unit Home Equipment: Grab bars - tub/shower, Cane - single point, Agricultural consultant (2 wheels), Crutches  Functional History: Prior Function Prior Level of Function : Independent/Modified Independent Functional Status:  Mobility: Bed Mobility Overal bed mobility: Needs Assistance Bed Mobility: Supine to Sit Supine to sit: Supervision General bed mobility comments: Pt motivated to complete supine>sit without assistance. Increased time and effort required with heavy use of rail to pull trunk into sitting position. Transfers Overall transfer level: Needs assistance Equipment used: 1 person hand held  assist Transfers: Sit to/from Stand Sit to Stand: Contact guard assist General transfer comment: Pt insisting that PT not provide physical assistance to stand. Increased time and significant effort required to power up to full stand. Once standing, pt with fair standing balance statically. Close guard provided for safety, and hands on assist provided once pt began to move, as he was very unsteady with dynamic movement. Ambulation/Gait Ambulation/Gait assistance: Min assist Gait Distance (Feet): 6 Feet Assistive device: 1 person hand held assist Gait Pattern/deviations: Step-to pattern, Decreased stride length, Decreased weight shift to right, Decreased stance time - right, Decreased step length - right, Trunk flexed, Narrow base of support General Gait Details: Pt with heavy reliance on UE's for support, reaching for bedside table and other furniture to brace himself on. Declining PT assist and RW, however due to extreme risk of falling, PT insisted on HHA and gait belt. Pt agreeable to using RW next session. Gait velocity: Decreased Gait velocity interpretation: <1.31 ft/sec, indicative of household ambulator    ADL:    Cognition: Cognition Orientation Level: Oriented X4 Cognition Arousal: Alert Behavior During Therapy: WFL for tasks assessed/performed   Review of Systems  Constitutional:  Positive for malaise/fatigue. Negative for fever.  HENT: Negative.    Eyes: Negative.   Respiratory:  Positive for shortness of breath.   Cardiovascular: Negative.   Gastrointestinal: Negative.   Genitourinary: Negative.   Musculoskeletal:  Positive for myalgias.  Skin: Negative.   Neurological:  Positive for focal weakness and weakness.  Psychiatric/Behavioral: Negative.     Past Medical History:  Diagnosis Date   Hypertension    Past Surgical History:  Procedure Laterality Date   APPENDECTOMY     TONSILLECTOMY     Family History  Problem  Relation Age of Onset   Pancreatic cancer  Mother    Hypertension Father    CAD Neg Hx    Diabetes Neg Hx    Social History:  reports that he has never smoked. He has never used smokeless tobacco. He reports that he does not drink alcohol and does not use drugs. Allergies: No Known Allergies Medications Prior to Admission  Medication Sig Dispense Refill   amLODipine  (NORVASC ) 10 MG tablet Take 10 mg by mouth daily.     ascorbic acid  (VITAMIN C ) 500 MG tablet Take 500 mg by mouth daily.     aspirin  81 MG EC tablet Take 81 mg by mouth daily. Swallow whole.     B Complex-C (SUPER B COMPLEX PO) Take 1 tablet by mouth daily.     Saw Palmetto, Serenoa repens, ([facility] SAW PALMETTO PO) Take 1 tablet by mouth daily.     VITAMIN E PO Take 1 tablet by mouth daily.       Blood pressure (!) 141/60, pulse (!) 57, temperature 98.1 F (36.7 C), temperature source Oral, resp. rate 17, height 5' 10 (1.778 m), weight 68.3 kg, SpO2 98%. Physical Exam Constitutional:      General: He is not in acute distress.    Appearance: He is not ill-appearing.  HENT:     Head: Normocephalic and atraumatic.     Nose: Nose normal.  Eyes:     Pupils: Pupils are equal, round, and reactive to light.  Cardiovascular:     Rate and Rhythm: Bradycardia present.     Pulses: Normal pulses.  Pulmonary:     Effort: Pulmonary effort is normal.  Abdominal:     General: Abdomen is flat.  Musculoskeletal:        General: No swelling or tenderness.     Cervical back: Normal range of motion.     Right lower leg: No edema.     Left lower leg: No edema.     Comments: Mid to low back TTP  Skin:    General: Skin is warm.     Findings: Bruising present.  Neurological:     Mental Status: He is alert.     Comments: Alert and oriented x 3. Normal insight and awareness. Intact Memory. Normal language and speech. Cranial nerve exam unremarkable. MMT: BUE 4/5 prox to distal. BLE 3/5 HF, KE and 4/5 ADF/PF. Sensory exam normal for light touch and pain in all 4 limbs. No limb  ataxia or cerebellar signs. No abnormal tone appreciated.  SABRA    Psychiatric:        Mood and Affect: Mood normal.        Behavior: Behavior normal.     No results found for this or any previous visit (from the past 24 hours). No results found.  Assessment/Plan: Diagnosis: Debility after massive pulmonary embolus and PEA Does the need for close, 24 hr/day medical supervision in concert with the patient's rehab needs make it unreasonable for this patient to be served in a less intensive setting? Yes Co-Morbidities requiring supervision/potential complications:  - Transaminitis due to hypotension -Acute kidney injury also likely due to hypotension -Hyponatremia- Anticoagulation considerations Due to bladder management, bowel management, safety, skin/wound care, disease management, medication administration, pain management, and patient education, does the patient require 24 hr/day rehab nursing? Yes Does the patient require coordinated care of a physician, rehab nurse, therapy disciplines of PT, OT to address physical and functional deficits in the context of the above medical diagnosis(es)?  Yes Addressing deficits in the following areas: balance, endurance, locomotion, strength, transferring, bowel/bladder control, bathing, dressing, feeding, grooming, toileting, and psychosocial support Can the patient actively participate in an intensive therapy program of at least 3 hrs of therapy per day at least 5 days per week? Yes The potential for patient to make measurable gains while on inpatient rehab is excellent Anticipated functional outcomes upon discharge from inpatient rehab are modified independent  with PT, modified independent with OT, n/a with SLP. Estimated rehab length of stay to reach the above functional goals is: 7 days Anticipated discharge destination: Home Overall Rehab/Functional Prognosis: excellent  POST ACUTE RECOMMENDATIONS: This patient's condition is appropriate for  continued rehabilitative care in the following setting: CIR Patient has agreed to participate in recommended program. Yes Note that insurance prior authorization may be required for reimbursement for recommended care.  Comment: Mr. Pilz was amazingly active prior to this admission. While rehab goals are mod I, he shouldn't go home initially without someone in the house with him. Fortunately, he has some family who can assist him once he's home. Rehab Admissions Coordinator to follow up.   I have personally performed a face to face diagnostic evaluation of this patient. Additionally, I have examined the patient's medical record including any pertinent labs and radiographic images.    Thanks,  Arthea ONEIDA Gunther, MD 01/16/2024

## 2024-01-16 NOTE — Evaluation (Signed)
 Occupational Therapy Evaluation Patient Details Name: Don Howard MRN: [id number] DOB: [birth year] Today's Date: 01/16/2024   History of Present Illness   Pt is a 89 y/o male who presents 01/11/2024 via EMS. He was admitted with PE s/p TPA, PEA arrest, and cardiogenic shock. PMH significant for HTN.     Clinical Impressions Pt presents with decline in function and safety with ADLs and ADL mobility with impaired strength, balance and endurance. PTA pt lives alone and was Ind with ADLs/selfcare, light meal prep and Mod I with mobility. Pt currently requires mod/min A sit-stand with RW, min Afor mobility using RW, Sup with UB ADLs, mod A with LB ADLs, mod A with toileting tasks. Pt very motivated, pleasant and cooperative. Recommend AIR for post acute rehab needs; reports that he has multiple family members that can provide support/assist once he returns home. OT will follow acutely to maximize level of function and safety     If plan is discharge home, recommend the following:   A lot of help with bathing/dressing/bathroom;A little help with walking and/or transfers;Assistance with cooking/housework;Assist for transportation;Help with stairs or ramp for entrance     Functional Status Assessment   Patient has had a recent decline in their functional status and demonstrates the ability to make significant improvements in function in a reasonable and predictable amount of time.     Equipment Recommendations   Other (comment) (defer)     Recommendations for Other Services         Precautions/Restrictions   Precautions Precautions: Fall Recall of Precautions/Restrictions: Impaired Precaution/Restrictions Comments: Poor safety awareness, does  not like hands on assist. Wants to do it himself without an AD but at a very high risk of falls. Restrictions Weight Bearing Restrictions Per Provider Order: No     Mobility Bed Mobility               General bed mobility  comments: pt in bathroom on commode with NT present upon OT arrival    Transfers Overall transfer level: Needs assistance Equipment used: Rolling walker (2 wheels) Transfers: Sit to/from Stand Sit to Stand: Mod assist, Min assist           General transfer comment: mod A from commode, min A from chair seated at sink. pt ambulated min A with RW fomr commode to 3 in 1 at sink      Balance Overall balance assessment: Needs assistance Sitting-balance support: Feet supported, No upper extremity supported Sitting balance-Leahy Scale: Fair     Standing balance support: No upper extremity supported, During functional activity Standing balance-Leahy Scale: Poor                             ADL either performed or assessed with clinical judgement   ADL Overall ADL's : Needs assistance/impaired Eating/Feeding: Independent   Grooming: Wash/dry hands;Wash/dry face;Contact guard assist;Supervision/safety;Sitting   Upper Body Bathing: Supervision/ safety;Sitting Upper Body Bathing Details (indicate cue type and reason): assit with back Lower Body Bathing: Moderate assistance   Upper Body Dressing : Supervision/safety;Sitting   Lower Body Dressing: Moderate assistance   Toilet Transfer: Moderate assistance;Minimal assistance;Rolling walker (2 wheels);Cueing for safety Toilet Transfer Details (indicate cue type and reason): sit - stand form toilet and back to toilet Toileting- Clothing Manipulation and Hygiene: Moderate assistance;Sit to/from stand       Functional mobility during ADLs: Moderate assistance;Minimal assistance;Cueing for safety;Rolling walker (2 wheels) General ADL Comments: pt seated  at sink for bathing, dressing and shaving activities     Vision Baseline Vision/History: 1 Wears glasses Ability to See in Adequate Light: 0 Adequate Patient Visual Report: No change from baseline       Perception         Praxis         Pertinent Vitals/Pain Pain  Assessment Pain Assessment: Faces Faces Pain Scale: Hurts little more Pain Location: R knee Pain Descriptors / Indicators: Sore, Aching Pain Intervention(s): Monitored during session, Repositioned     Extremity/Trunk Assessment Upper Extremity Assessment Upper Extremity Assessment: Generalized weakness;Right hand dominant   Lower Extremity Assessment Lower Extremity Assessment: Defer to PT evaluation   Cervical / Trunk Assessment Cervical / Trunk Assessment: Kyphotic   Communication Communication Communication: Impaired Factors Affecting Communication: Hearing impaired   Cognition Arousal: Alert Behavior During Therapy: WFL for tasks assessed/performed                                 Following commands: Intact       Cueing  General Comments   Cueing Techniques: Verbal cues;Gestural cues;Tactile cues      Exercises     Shoulder Instructions      Home Living Family/patient expects to be discharged to:: Private residence Living Arrangements: Alone Available Help at Discharge: Family;Available PRN/intermittently Type of Home: House Home Access: Stairs to enter [employer] of Steps: 2 Entrance Stairs-Rails: Right Home Layout: One level     Bathroom Shower/Tub: Chief Strategy Officer: Standard     Home Equipment: Grab bars - tub/shower;Cane - single Librarian, academic (2 wheels);Crutches          Prior Functioning/Environment Prior Level of Function : Independent/Modified Independent             Mobility Comments: has ADs but reports that he does not use them ADLs Comments: Ind with ADLs, light cooking    OT Problem List: Decreased strength;Impaired balance (sitting and/or standing);Decreased safety awareness;Decreased activity tolerance;Decreased knowledge of use of DME or AE   OT Treatment/Interventions: Self-care/ADL training;Patient/family education;Therapeutic exercise;Balance training;Therapeutic  activities;DME and/or AE instruction      OT Goals(Current goals can be found in the care plan section)   Acute Rehab OT Goals Patient Stated Goal: get stronger OT Goal Formulation: With patient Time For Goal Achievement: 01/30/24 Potential to Achieve Goals: Good ADL Goals Pt Will Perform Grooming: with contact guard assist;with supervision;standing Pt Will Perform Lower Body Bathing: with min assist;sitting/lateral leans;sit to/from stand Pt Will Perform Lower Body Dressing: with min assist;sitting/lateral leans;sit to/from stand Pt Will Transfer to Toilet: with min assist;with contact guard assist;ambulating Pt Will Perform Toileting - Clothing Manipulation and hygiene: with min assist;with contact guard assist;sitting/lateral leans;sit to/from stand   OT Frequency:  Min 2X/week    Co-evaluation              AM-PAC OT 6 Clicks Daily Activity     Outcome Measure Help from another person eating meals?: None Help from another person taking care of personal grooming?: A Little Help from another person toileting, which includes using toliet, bedpan, or urinal?: A Lot Help from another person bathing (including washing, rinsing, drying)?: A Lot Help from another person to put on and taking off regular upper body clothing?: A Little Help from another person to put on and taking off regular lower body clothing?: A Lot 6 Click Score: 16   End of Session  Equipment Utilized During Treatment: Gait belt;Rolling walker (2 wheels);Other (comment) (3 in 1) Nurse Communication: Mobility status  Activity Tolerance: Patient tolerated treatment well Patient left: in chair;with call bell/phone within reach;with chair alarm set  OT Visit Diagnosis: Unsteadiness on feet (R26.81);Other abnormalities of gait and mobility (R26.89);Muscle weakness (generalized) (M62.81)                Time: 8940-8853 OT Time Calculation (min): 47 min Charges:  OT General Charges $OT Visit: 1 Visit OT  Evaluation $OT Eval Moderate Complexity: 1 Mod OT Treatments $Self Care/Home Management : 8-22 mins $Therapeutic Activity: 8-22 mins    Jacques Karna Loose 01/16/2024, 2:37 PM

## 2024-01-16 NOTE — Progress Notes (Signed)
 Morning med pass delayed due to patient working with mobility and in the restroom. I followed up a couple times and pt was still in restroom.

## 2024-01-16 NOTE — Progress Notes (Addendum)
 Inpatient Rehab Admissions Coordinator:   Spoke to patient on the phone to review rehab recommendations with goals of mod I and est length of stay about one week.  He said his grandson should be able to stay with him some at d/c, but currently he's in [facility] moving his children into college.  I left message for family regarding caregiver support at discharge.  Will follow.   1302: Spoke to pt's 2 grandsons who agree to pursue CIR and that family can pull together initial supervision at discharge.  I will sent to insurance today.   Reche Lowers, PT, DPT Admissions Coordinator [phone] 01/16/24  12:19 PM

## 2024-01-16 NOTE — Care Management Important Message (Signed)
 Important Message  Patient Details  Name: Don Howard MRN: [id number] Date of Birth: 02-24-1927   Important Message Given:  Yes - Medicare IM     Jon Cruel 01/16/2024, 12:04 PM

## 2024-01-16 NOTE — Progress Notes (Signed)
 PROGRESS NOTE    Don Howard  FMW:[id number] DOB: 03/29/1927 DOA: 01/11/2024 PCP: Kip Righter, MD    Brief Narrative:  89 year old male with a history of hypertension, bradycardia, and PVCs, followed by cardiology, presented with PE s/p TPA    Assessment and Plan: AMS (present on admission-- resolved) -CVA (ruled out)   Acute hypoxic respiratory failure due to PE status post half dose tPA currently on heparin  - now on elquis  - No clear provoking factor for PE will consider lifelong anticoagulation. -PT eval --CIR  RV strain/Obstructive shock/RV strain in the setting of massive PE.   -He will need a repeat echocardiogram as an outpatient    Transaminits (improving) - Likely due to hypotension  -trend   AKI  - Likely prerenal improved with hydration    Hyponatremia --mild   DVT prophylaxis: SCDs Start: 01/11/24 1427 apixaban  (ELIQUIS ) tablet 10 mg  apixaban  (ELIQUIS ) tablet 5 mg    Code Status: Limited: Do not attempt resuscitation (DNR) -DNR-LIMITED -Do Not Intubate/DNI  Family Communication:   Disposition Plan:  Level of care: Med-Surg Status is: Inpatient  PT eval-- CIR     Consultants:  PCCM cards   Subjective: No overnight events  Objective: Vitals:   01/15/24 2042 01/16/24 0404 01/16/24 0406 01/16/24 0832  BP: (!) 102/56  120/67 (!) 141/60  Pulse: 68  60 (!) 57  Resp: 17  17 17   Temp: 98.3 F (36.8 C)  98 F (36.7 C) 98.1 F (36.7 C)  TempSrc: Oral  Oral Oral  SpO2: 95%  95% 98%  Weight:  68.3 kg    Height:        Intake/Output Summary (Last 24 hours) at 01/16/2024 1154 Last data filed at 01/16/2024 1030 Gross per 24 hour  Intake 580 ml  Output 400 ml  Net 180 ml   Filed Weights   01/13/24 0500 01/14/24 0402 01/16/24 0404  Weight: 73.3 kg 67.2 kg 68.3 kg    Examination:   General: Appearance:    Well developed, well nourished male in no acute distress     Lungs:     respirations unlabored  Heart:    Bradycardic.    MS:    All extremities are intact.    Neurologic:   Awake, alert       Data Reviewed: I have personally reviewed following labs and imaging studies  CBC: Recent Labs  Lab 01/11/24 1223 01/11/24 1853 01/12/24 0329 01/13/24 0304 01/15/24 0736  WBC 22.1* 13.9* 9.8 8.7 8.2  NEUTROABS 18.4*  --   --   --   --   HGB 12.2* 11.8* 10.2* 10.0* 11.5*  HCT 36.9* 35.4* 30.3* 29.4* 33.3*  MCV 95.6 94.7 93.8 92.7 92.2  PLT 203 196 167 141* 260   Basic Metabolic Panel: Recent Labs  Lab 01/11/24 1109 01/11/24 1223 01/12/24 0329 01/13/24 0304 01/15/24 0736  NA 134* 132* 138 132* 134*  K 3.8 3.5 3.9 3.6 4.1  CL 100 99 106 105 102  CO2  --  18* 20* 20* 23  GLUCOSE 155* 190* 102* 107* 95  BUN 29* 28* 24* 20 9  CREATININE 1.50* 1.69* 1.29* 0.91 0.78  CALCIUM   --  7.9* 8.1* 7.8* 8.2*  MG  --  2.2 1.8  --   --   PHOS  --   --  3.6  --   --    GFR: Estimated Creatinine Clearance: 53.4 mL/min (by C-G formula based on SCr of 0.78 mg/dL). Liver Function  Tests: Recent Labs  Lab 01/11/24 1223 01/12/24 0329 01/13/24 0304  AST 219* 114* 80*  ALT 157* 111* 86*  ALKPHOS 50 44 44  BILITOT 1.8* 0.8 0.4  PROT 5.5* 4.6* 4.7*  ALBUMIN 2.9* 2.4* 2.3*   No results for input(s): LIPASE, AMYLASE in the last 168 hours. No results for input(s): AMMONIA in the last 168 hours. Coagulation Profile: Recent Labs  Lab 01/11/24 1106 01/11/24 1853  INR 1.2 1.2   Cardiac Enzymes: No results for input(s): CKTOTAL, CKMB, CKMBINDEX, TROPONINI in the last 168 hours. BNP (last 3 results) No results for input(s): PROBNP in the last 8760 hours. HbA1C: No results for input(s): HGBA1C in the last 72 hours. CBG: Recent Labs  Lab 01/12/24 1508 01/12/24 1917 01/12/24 2314 01/13/24 0311 01/13/24 0801  GLUCAP 119* 118* 110* 97 105*   Lipid Profile: No results for input(s): CHOL, HDL, LDLCALC, TRIG, CHOLHDL, LDLDIRECT in the last 72 hours. Thyroid  Function Tests: No results  for input(s): TSH, T4TOTAL, FREET4, T3FREE, THYROIDAB in the last 72 hours. Anemia Panel: No results for input(s): VITAMINB12, FOLATE, FERRITIN, TIBC, IRON, RETICCTPCT in the last 72 hours. Sepsis Labs: Recent Labs  Lab 01/11/24 1256 01/11/24 1652 01/11/24 1853  PROCALCITON  --  1.88  --   LATICACIDVEN 3.2* 3.1* 3.0*    Recent Results (from the past 240 hours)  Resp panel by RT-PCR (RSV, Flu A&B, Covid) Anterior Nasal Swab     Status: None   Collection Time: 01/11/24 12:23 PM   Specimen: Anterior Nasal Swab  Result Value Ref Range Status   SARS Coronavirus 2 by RT PCR NEGATIVE NEGATIVE Final   Influenza A by PCR NEGATIVE NEGATIVE Final   Influenza B by PCR NEGATIVE NEGATIVE Final    Comment: (NOTE) The Xpert Xpress SARS-CoV-2/FLU/RSV plus assay is intended as an aid in the diagnosis of influenza from Nasopharyngeal swab specimens and should not be used as a sole basis for treatment. Nasal washings and aspirates are unacceptable for Xpert Xpress SARS-CoV-2/FLU/RSV testing.  Fact Sheet for Patients: BloggerCourse.com  Fact Sheet for Healthcare Providers: SeriousBroker.it  This test is not yet approved or cleared by the [place]  FDA and has been authorized for detection and/or diagnosis of SARS-CoV-2 by FDA under an Emergency Use Authorization (EUA). This EUA will remain in effect (meaning this test can be used) for the duration of the COVID-19 declaration under Section 564(b)(1) of the Act, 21 U.S.C. section 360bbb-3(b)(1), unless the authorization is terminated or revoked.     Resp Syncytial Virus by PCR NEGATIVE NEGATIVE Final    Comment: (NOTE) Fact Sheet for Patients: BloggerCourse.com  Fact Sheet for Healthcare Providers: SeriousBroker.it  This test is not yet approved or cleared by the [place]  FDA and has been authorized for  detection and/or diagnosis of SARS-CoV-2 by FDA under an Emergency Use Authorization (EUA). This EUA will remain in effect (meaning this test can be used) for the duration of the COVID-19 declaration under Section 564(b)(1) of the Act, 21 U.S.C. section 360bbb-3(b)(1), unless the authorization is terminated or revoked.  Performed at [facility], [address], [place], [place] [address]   Blood Culture (routine x 2)     Status: None   Collection Time: 01/11/24 12:23 PM   Specimen: BLOOD  Result Value Ref Range Status   Specimen Description BLOOD SITE NOT SPECIFIED  Final   Special Requests   Final    BOTTLES DRAWN AEROBIC AND ANAEROBIC Blood Culture adequate volume   Culture  Final    NO GROWTH 5 DAYS Performed at [facility], [address], [place], [place] [address]    Report Status 01/16/2024 FINAL  Final  Blood Culture (routine x 2)     Status: None   Collection Time: 01/11/24 12:35 PM   Specimen: BLOOD  Result Value Ref Range Status   Specimen Description BLOOD SITE NOT SPECIFIED  Final   Special Requests   Final    BOTTLES DRAWN AEROBIC AND ANAEROBIC Blood Culture adequate volume   Culture   Final    NO GROWTH 5 DAYS Performed at [facility], [address], [place], [place] [address]    Report Status 01/16/2024 FINAL  Final  MRSA Next Gen by PCR, Nasal     Status: None   Collection Time: 01/11/24  4:05 PM   Specimen: Nasal Mucosa; Nasal Swab  Result Value Ref Range Status   MRSA by PCR Next Gen NOT DETECTED NOT DETECTED Final    Comment: (NOTE) The GeneXpert MRSA Assay (FDA approved for NASAL specimens only), is one component of a comprehensive MRSA colonization surveillance program. It is not intended to diagnose MRSA infection nor to guide or monitor treatment for MRSA infections. Test performance is not FDA approved in patients less than 40 years old. Performed at [facility], [address] [place], [place] [address]           Radiology Studies: No results found.       Scheduled Meds:  amLODipine   5 mg Oral Daily   apixaban   10 mg Oral BID   Followed by   NOREEN ON 01/20/2024] apixaban   5 mg Oral BID   aspirin   81 mg Oral Daily   Chlorhexidine  Gluconate Cloth  6 each Topical Daily   Continuous Infusions:   LOS: 5 days    Time spent: 45 minutes spent on chart review, discussion with nursing staff, consultants, updating family and interview/physical exam; more than 50% of that time was spent in counseling and/or coordination of care.    Harlene RAYMOND Bowl, DO [facility] Available via Epic secure chat 7am-7pm After these hours, please refer to coverage provider listed on [email or web address] 01/16/2024, 11:54 AM

## 2024-01-16 NOTE — PMR Pre-admission (Signed)
 PMR Admission Coordinator Pre-Admission Assessment  Patient: Don Howard is an 89 y.o., male MRN: [id number] DOB: 28-Jul-1926 Height: 5' 10 (177.8 cm) Weight: 68.3 kg              Insurance Information HMO:     PPO: yes     PCP:      IPA:      80/20:      OTHER:  PRIMARY: BCBS Medicare      Policy#: [id number]       Subscriber: pt CM Name: Don Howard      Phone#: [phone]     Fax#: [phone] Pre-Cert#: [id number] auth for CIR from [place] with BCBS Medicare with updates due to her on 8/19      Employer:  Benefits:  Phone #: [phone]     Name:  Eff. Date: 06/07/23     Deduct: $0      Out of Pocket Max: $3900 (met $580 )      Life Max: n/a  CIR: $295/day for days 1-5      SNF: 20 full days Outpatient:      Co-Pay: $25/day Home Health: 100%      Co-Pay:  DME: 80%     Co-Pay: 20% Providers:  SECONDARY:       Policy#:       Phone#:   Artist:       Phone#:   The Engineer, materials Information Summary" for patients in Inpatient Rehabilitation Facilities with attached "Privacy Act Statement-Health Care Records" was provided and verbally reviewed with: Patient and Family  Emergency Contact Information Contact Information     Name Relation Home Work Mobile   Howard,Don Grandson   [phone]   Howard, Don   [phone]      Other Contacts   None on File    Current Medical History  Patient Admitting Diagnosis: debility following massive PE with PEA arrest   History of Present Illness: Pt is a 89 y/o male with PMH of CAD, HTN, and bradycardia with several day history of abdominal pain and not feeling well who presented to [facility] on 01/11/24 with aphasia and syncope.  EMS noted pt to be bradycardic and new left gaze preference and aphasia.  Pt lost pulses with EMS and received 2 rounds of CPR before ROSC.  No meds.  EKG showed R BBB with T wave inversions, ?rapid afib, and frequent ectopy.  In ED he was hypotensive requiring pressors and fluid  resuscitation.  Neurology was consulted on arrival for code stroke with NIHSS 5.  Cardiology consulted as well.  PCCM admitted for cardiac arrest with syncope with high probability for massive PE, shock, and AKI.  Stat CTA obtained which confirmed bilateral moderate to large PE burden at the subsegmental level with some fully occluding some partially occluding and right heart strain.  He received half dose TNK and started on heparin  gtt.  CVA was ruled out.  He was transitioned to eliquis .  Therapy evaluations completed and pt was recommended for CIR due to functional decline.   Complete NIHSS TOTAL: 0 Glasgow Coma Scale Score: 15  Patient's medical record from [facility] has been reviewed by the rehabilitation admission coordinator and physician.  Past Medical History  Past Medical History:  Diagnosis Date   Hypertension     Has the patient had major surgery during 100 days prior to admission? No  Family History  family history includes Hypertension in his father; Pancreatic cancer in his mother.  Current Medications   Current Facility-Administered Medications:    acetaminophen  (TYLENOL ) tablet 650 mg, 650 mg, Oral, Q6H PRN, Hattar, Zola SAILOR, MD, 650 mg at 01/13/24 1601   amLODipine  (NORVASC ) tablet 5 mg, 5 mg, Oral, Daily, Vann, Jessica U, DO, 5 mg at 01/16/24 1157   apixaban  (ELIQUIS ) tablet 10 mg, 10 mg, Oral, BID, 10 mg at 01/16/24 1156 **FOLLOWED BY** [START ON 01/20/2024] apixaban  (ELIQUIS ) tablet 5 mg, 5 mg, Oral, BID, Hattar, Zola SAILOR, MD   aspirin  chewable tablet 81 mg, 81 mg, Oral, Daily, Desai, Rahul P, PA-C, 81 mg at 01/16/24 1157   docusate sodium  (COLACE) capsule 100 mg, 100 mg, Oral, BID PRN, Desai, Rahul P, PA-C   melatonin tablet 5 mg, 5 mg, Oral, QHS PRN, Paliwal, Aditya, MD, 5 mg at 01/11/24 2221   polyethylene glycol (MIRALAX  / GLYCOLAX ) packet 17 g, 17 g, Oral, Daily PRN, Desai, Rahul P, PA-C  Patients Current Diet:  Diet Order             Diet Heart Room service  appropriate? Yes; Fluid consistency: Thin  Diet effective now                   Precautions / Restrictions Precautions Precautions: Fall Precaution/Restrictions Comments: Poor safety awareness, does  not like hands on assist. Wants to do it himself without an AD but at a very high risk of falls. Restrictions Weight Bearing Restrictions Per Provider Order: No   Has the patient had 2 or more falls or a fall with injury in the past year?No  Prior Activity Level Community (5-7x/wk): mod I with cane PTA, driving, mowing his yard  Prior Functional Level Prior Function Prior Level of Function : Independent/Modified Independent  Self Care: Did the patient need help bathing, dressing, using the toilet or eating?  Independent  Indoor Mobility: Did the patient need assistance with walking from room to room (with or without device)? Independent  Stairs: Did the patient need assistance with internal or external stairs (with or without device)? Independent  Functional Cognition: Did the patient need help planning regular tasks such as shopping or remembering to take medications? Independent  Patient Information Are you of Hispanic, Latino/a,or Spanish origin?: A. No, not of Hispanic, Latino/a, or Spanish origin What is your race?: A. White Do you need or want an interpreter to communicate with a doctor or health care staff?: 0. No  Patient's Response To:  Health Literacy and Transportation Is the patient able to respond to health literacy and transportation needs?: Yes Health Literacy - How often do you need to have someone help you when you read instructions, pamphlets, or other written material from your doctor or pharmacy?: Never In the past 12 months, has lack of transportation kept you from medical appointments or from getting medications?: No In the past 12 months, has lack of transportation kept you from meetings, work, or from getting things needed for daily living?: No  Occupational psychologist / Equipment Home Equipment: Grab bars - tub/shower, Medical laboratory scientific officer - single point, Agricultural consultant (2 wheels), Crutches  Prior Device Use: Indicate devices/aids used by the patient prior to current illness, exacerbation or injury? 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 Pt with  difficulty weight bearing through the R knee due to pain this date. RLE: Unable to fully assess due to pain    ADLs       Mobility  Overal bed mobility: Needs Assistance Bed Mobility: Supine to Sit Supine to sit: Supervision General bed mobility comments: Pt motivated to complete supine>sit without assistance. Increased time and effort required with heavy use of rail to pull trunk into sitting position.    Transfers  Overall transfer level: Needs assistance Equipment used: 1 person hand held assist Transfers: Sit to/from Stand Sit to Stand: Contact guard assist General transfer comment: Pt insisting that PT not provide physical assistance to stand. Increased time and significant effort required to power up to full stand. Once standing, pt with fair standing balance statically. Close guard provided for safety, and hands on assist provided once pt began to move, as he was very unsteady with dynamic movement.    Ambulation / Gait / Stairs / Wheelchair Mobility  Ambulation/Gait Ambulation/Gait assistance: Editor, commissioning (Feet): 6 Feet Assistive device: 1 person hand held assist Gait Pattern/deviations: Step-to pattern, Decreased stride length, Decreased weight shift to right, Decreased stance time - right, Decreased step length - right, Trunk flexed, Narrow base of  support General Gait Details: Pt with heavy reliance on UE's for support, reaching for bedside table and other furniture to brace himself on. Declining PT assist and RW, however due to extreme risk of falling, PT insisted on HHA and gait belt. Pt agreeable to using RW next session. Gait velocity: Decreased Gait velocity interpretation: <1.31 ft/sec, indicative of household ambulator    Posture / Balance Balance Overall balance assessment: Needs assistance Sitting-balance support: Feet supported, No upper extremity supported Sitting balance-Leahy Scale: Fair Standing balance support: No upper extremity supported, During functional activity Standing balance-Leahy Scale: Poor    Special considerations/ Life events N/a     Previous Home Environment (from acute therapy documentation) Living Arrangements: Alone Available Help at Discharge: Family, Available PRN/intermittently Type of Home: House Home Layout: One level Home Access: Stairs to enter Entrance Stairs-Rails: Right Entrance Stairs-Number of Steps: 2 Bathroom Shower/Tub: Tub/shower unit  Discharge Living Setting Plans for Discharge Living Setting: Patient's home (initial support from family) Type of Home at Discharge: House Discharge Home Layout: One level Discharge Home Access: Stairs to enter Entrance Stairs-Rails: Right Entrance Stairs-Number of Steps: 2 Discharge Bathroom Shower/Tub: Tub/shower unit Discharge Bathroom Toilet: Standard Discharge Bathroom Accessibility: Yes How Accessible: Accessible via walker Does the patient have any problems obtaining your medications?: No  Social/Family/Support Systems Anticipated Caregiver: grandson, Jerrell is POA Anticipated Industrial/product designer Information: [phone] Ability/Limitations of Caregiver: none stated Caregiver Availability: 24/7 Discharge Plan Discussed with Primary Caregiver: Yes Is Caregiver In Agreement with Plan?: Yes Does Caregiver/Family have Issues  with Lodging/Transportation while Pt is in Rehab?: No   Goals Patient/Family Goal for Rehab: PT/OT mod I, SLP n/a Expected length of stay: 7-9 days Additional Information: Discharge plan: return to pts home, mod I goals, but recommend family supervision initially at discharge per Dr. Babs Pt/Family Agrees to Admission and willing to participate: Yes Program Orientation Provided & Reviewed with Pt/Caregiver Including Roles  & Responsibilities: Yes   Decrease burden of Care through IP rehab admission: n/a   Possible need for SNF placement upon discharge: Not anticipated.  Plan for mod I goals with initial supervision at home to ensure safe transition from hospital>home.    Patient Condition: This patient's condition remains as documented in the consult dated 01/16/24, in  which the Rehabilitation Physician determined and documented that the patient's condition is appropriate for intensive rehabilitative care in an inpatient rehabilitation facility. Will admit to inpatient rehab today.  Preadmission Screen Completed By:  Reche FORBES Lowers, PT, 01/16/2024 12:31 PM ______________________________________________________________________   Discussed status with Dr. Urbano on 01/17/24  at 12:28 PM  and received approval for admission today.  Admission Coordinator:  Caitlin E Warren, PT, DPT time 12:28 PM /Date08/13/25

## 2024-01-17 ENCOUNTER — Encounter (HOSPITAL_COMMUNITY): Payer: Self-pay | Admitting: Physical Medicine and Rehabilitation

## 2024-01-17 ENCOUNTER — Other Ambulatory Visit: Payer: Self-pay

## 2024-01-17 ENCOUNTER — Inpatient Hospital Stay (HOSPITAL_COMMUNITY)
Admission: AD | Admit: 2024-01-17 | Discharge: 2024-01-26 | DRG: 945 | Disposition: A | Discharge status: Home Health | Source: Intra-hospital | Attending: Physical Medicine and Rehabilitation | Admitting: Physical Medicine and Rehabilitation

## 2024-01-17 DIAGNOSIS — M25561 Pain in right knee: Secondary | ICD-10-CM

## 2024-01-17 DIAGNOSIS — I4510 Unspecified right bundle-branch block: Secondary | ICD-10-CM | POA: Diagnosis not present

## 2024-01-17 DIAGNOSIS — J9601 Acute respiratory failure with hypoxia: Secondary | ICD-10-CM | POA: Diagnosis not present

## 2024-01-17 DIAGNOSIS — N179 Acute kidney failure, unspecified: Secondary | ICD-10-CM | POA: Diagnosis present

## 2024-01-17 DIAGNOSIS — Z7982 Long term (current) use of aspirin: Secondary | ICD-10-CM

## 2024-01-17 DIAGNOSIS — E872 Acidosis, unspecified: Secondary | ICD-10-CM | POA: Diagnosis not present

## 2024-01-17 DIAGNOSIS — T2002XA Burn of unspecified degree of lip(s), initial encounter: Secondary | ICD-10-CM | POA: Diagnosis not present

## 2024-01-17 DIAGNOSIS — R570 Cardiogenic shock: Secondary | ICD-10-CM | POA: Diagnosis not present

## 2024-01-17 DIAGNOSIS — X101XXA Contact with hot food, initial encounter: Secondary | ICD-10-CM | POA: Diagnosis not present

## 2024-01-17 DIAGNOSIS — I2699 Other pulmonary embolism without acute cor pulmonale: Secondary | ICD-10-CM

## 2024-01-17 DIAGNOSIS — I2510 Atherosclerotic heart disease of native coronary artery without angina pectoris: Secondary | ICD-10-CM | POA: Diagnosis not present

## 2024-01-17 DIAGNOSIS — I469 Cardiac arrest, cause unspecified: Secondary | ICD-10-CM | POA: Diagnosis not present

## 2024-01-17 DIAGNOSIS — I959 Hypotension, unspecified: Secondary | ICD-10-CM | POA: Diagnosis not present

## 2024-01-17 DIAGNOSIS — Z8249 Family history of ischemic heart disease and other diseases of the circulatory system: Secondary | ICD-10-CM

## 2024-01-17 DIAGNOSIS — Z800 Family history of malignant neoplasm of digestive organs: Secondary | ICD-10-CM

## 2024-01-17 DIAGNOSIS — Z8674 Personal history of sudden cardiac arrest: Secondary | ICD-10-CM | POA: Diagnosis not present

## 2024-01-17 DIAGNOSIS — I10 Essential (primary) hypertension: Secondary | ICD-10-CM | POA: Diagnosis present

## 2024-01-17 DIAGNOSIS — I2609 Other pulmonary embolism with acute cor pulmonale: Secondary | ICD-10-CM

## 2024-01-17 DIAGNOSIS — R739 Hyperglycemia, unspecified: Secondary | ICD-10-CM | POA: Diagnosis present

## 2024-01-17 DIAGNOSIS — D649 Anemia, unspecified: Secondary | ICD-10-CM | POA: Diagnosis present

## 2024-01-17 DIAGNOSIS — D72829 Elevated white blood cell count, unspecified: Secondary | ICD-10-CM | POA: Diagnosis present

## 2024-01-17 DIAGNOSIS — R5381 Other malaise: Principal | ICD-10-CM | POA: Diagnosis present

## 2024-01-17 DIAGNOSIS — I69398 Other sequelae of cerebral infarction: Secondary | ICD-10-CM | POA: Diagnosis not present

## 2024-01-17 DIAGNOSIS — H9193 Unspecified hearing loss, bilateral: Secondary | ICD-10-CM | POA: Diagnosis present

## 2024-01-17 DIAGNOSIS — R7401 Elevation of levels of liver transaminase levels: Secondary | ICD-10-CM | POA: Diagnosis not present

## 2024-01-17 DIAGNOSIS — E871 Hypo-osmolality and hyponatremia: Secondary | ICD-10-CM | POA: Diagnosis not present

## 2024-01-17 DIAGNOSIS — R059 Cough, unspecified: Secondary | ICD-10-CM | POA: Diagnosis not present

## 2024-01-17 DIAGNOSIS — N400 Enlarged prostate without lower urinary tract symptoms: Secondary | ICD-10-CM | POA: Diagnosis not present

## 2024-01-17 DIAGNOSIS — Z7901 Long term (current) use of anticoagulants: Secondary | ICD-10-CM | POA: Diagnosis not present

## 2024-01-17 DIAGNOSIS — R001 Bradycardia, unspecified: Secondary | ICD-10-CM | POA: Diagnosis present

## 2024-01-17 DIAGNOSIS — G8929 Other chronic pain: Secondary | ICD-10-CM

## 2024-01-17 DIAGNOSIS — Z79899 Other long term (current) drug therapy: Secondary | ICD-10-CM

## 2024-01-17 DIAGNOSIS — M1711 Unilateral primary osteoarthritis, right knee: Secondary | ICD-10-CM | POA: Diagnosis not present

## 2024-01-17 DIAGNOSIS — I4891 Unspecified atrial fibrillation: Secondary | ICD-10-CM | POA: Diagnosis not present

## 2024-01-17 DIAGNOSIS — G4700 Insomnia, unspecified: Secondary | ICD-10-CM | POA: Diagnosis present

## 2024-01-17 MED ORDER — AMLODIPINE BESYLATE 5 MG PO TABS: 5.0000 mg | ORAL_TABLET | Freq: Every day | ORAL | Status: AC

## 2024-01-17 MED ORDER — PROCHLORPERAZINE EDISYLATE 10 MG/2ML IJ SOLN: 5.0000 mg | Freq: Four times a day (QID) | INTRAMUSCULAR | Status: DC | PRN

## 2024-01-17 MED ORDER — APIXABAN 5 MG PO TABS
5.0000 mg | ORAL_TABLET | Freq: Two times a day (BID) | ORAL | Status: DC
  Administered 2024-01-20 – 2024-01-26 (×13): 5 mg via ORAL
  Filled 2024-01-17 (×13): qty 1

## 2024-01-17 MED ORDER — DICLOFENAC SODIUM 1 % EX GEL
4.0000 g | Freq: Four times a day (QID) | CUTANEOUS | Status: DC
  Administered 2024-01-17 – 2024-01-26 (×23): 4 g via TOPICAL
  Filled 2024-01-17: qty 100

## 2024-01-17 MED ORDER — PROCHLORPERAZINE MALEATE 5 MG PO TABS: 5.0000 mg | ORAL_TABLET | Freq: Four times a day (QID) | ORAL | Status: DC | PRN

## 2024-01-17 MED ORDER — VITAMIN C 500 MG PO TABS
1000.0000 mg | ORAL_TABLET | Freq: Every day | ORAL | Status: DC
  Administered 2024-01-17 – 2024-01-26 (×11): 1000 mg via ORAL
  Filled 2024-01-17 (×10): qty 2

## 2024-01-17 MED ORDER — TRAZODONE HCL 50 MG PO TABS
25.0000 mg | ORAL_TABLET | Freq: Every evening | ORAL | Status: DC | PRN
  Administered 2024-01-18: 25 mg via ORAL
  Filled 2024-01-17: qty 1

## 2024-01-17 MED ORDER — FLEET ENEMA RE ENEM: 1.0000 | ENEMA | Freq: Once | RECTAL | Status: DC | PRN

## 2024-01-17 MED ORDER — AMLODIPINE BESYLATE 5 MG PO TABS
5.0000 mg | ORAL_TABLET | Freq: Every day | ORAL | Status: DC
  Administered 2024-01-18 – 2024-01-21 (×4): 5 mg via ORAL
  Filled 2024-01-17 (×4): qty 1

## 2024-01-17 MED ORDER — ALUM & MAG HYDROXIDE-SIMETH 200-200-20 MG/5ML PO SUSP: 30.0000 mL | ORAL | Status: DC | PRN

## 2024-01-17 MED ORDER — POLYETHYLENE GLYCOL 3350 17 G PO PACK: 17.0000 g | PACK | Freq: Every day | ORAL | Status: DC | PRN

## 2024-01-17 MED ORDER — ASPIRIN 81 MG PO CHEW: 81.0000 mg | CHEWABLE_TABLET | Freq: Every day | ORAL | Status: DC

## 2024-01-17 MED ORDER — PROCHLORPERAZINE 25 MG RE SUPP
12.5000 mg | Freq: Four times a day (QID) | RECTAL | Status: DC | PRN
Start: 2024-01-17 — End: 2024-01-26

## 2024-01-17 MED ORDER — ACETAMINOPHEN 325 MG PO TABS
325.0000 mg | ORAL_TABLET | ORAL | Status: DC | PRN
  Administered 2024-01-18 – 2024-01-24 (×9): 650 mg via ORAL
  Filled 2024-01-17 (×9): qty 2

## 2024-01-17 MED ORDER — APIXABAN 5 MG PO TABS
10.0000 mg | ORAL_TABLET | Freq: Two times a day (BID) | ORAL | Status: AC
  Administered 2024-01-17 – 2024-01-19 (×6): 10 mg via ORAL
  Filled 2024-01-17 (×5): qty 2

## 2024-01-17 MED ORDER — APIXABAN 5 MG PO TABS: ORAL_TABLET | ORAL | Status: DC

## 2024-01-17 MED ORDER — BISACODYL 10 MG RE SUPP: 10.0000 mg | Freq: Every day | RECTAL | Status: DC | PRN

## 2024-01-17 MED ORDER — DIPHENHYDRAMINE HCL 25 MG PO CAPS: 25.0000 mg | ORAL_CAPSULE | Freq: Four times a day (QID) | ORAL | Status: DC | PRN

## 2024-01-17 MED ORDER — GUAIFENESIN-DM 100-10 MG/5ML PO SYRP: 5.0000 mL | ORAL_SOLUTION | Freq: Four times a day (QID) | ORAL | Status: DC | PRN

## 2024-01-17 NOTE — Progress Notes (Signed)
 Babs Arthea DASEN, MD  Physician Physical Medicine and Rehabilitation   Consult Note     Signed   Date of Service: 01/16/2024 10:45 AM  Related encounter: ED to Hosp-Admission (Discharged) from 01/11/2024 in [facility]  SURGICAL   Signed     Expand All Collapse All           Physical Medicine and Rehabilitation Consult Reason for Consult:impaired functional mobility Referring Physician: Juvenal     HPI: Don Howard is a 89 y.o. male with a history of hypertension who presented on 01/11/2024 after feeling poorly for a few days.  Patient quickly decompensated and went into PEA.  He was found to have a massive pulmonary embolus for which he received tPA and started on heparin  drip.  Patient also developed left-sided gaze preference.  Stroke workup was negative.  Patient with right ventricular strain due to the massive pulmonary embolus.  Patient has been showing improvement over the last 2 days.  He was transitioned over to Eliquis  without issue thus far.  Patient was up with therapy yesterday and transferred sit to stand contact-guard assist and walked 6 feet min assist with 1 person holding his hand.  Patient lives alone but apparently does have some family available.  He was modified independent prior to this admission.       Home: Home Living Family/patient expects to be discharged to:: Private residence Living Arrangements: Alone Available Help at Discharge: Family, Available PRN/intermittently Type of Home: House Home Access: Stairs to enter Secretary/administrator of Steps: 2 Entrance Stairs-Rails: Right Home Layout: One level Bathroom Shower/Tub: Tub/shower unit Home Equipment: Grab bars - tub/shower, Cane - single point, Agricultural consultant (2 wheels), Crutches  Functional History: Prior Function Prior Level of Function : Independent/Modified Independent Functional Status:  Mobility: Bed Mobility Overal bed mobility: Needs Assistance Bed  Mobility: Supine to Sit Supine to sit: Supervision General bed mobility comments: Pt motivated to complete supine>sit without assistance. Increased time and effort required with heavy use of rail to pull trunk into sitting position. Transfers Overall transfer level: Needs assistance Equipment used: 1 person hand held assist Transfers: Sit to/from Stand Sit to Stand: Contact guard assist General transfer comment: Pt insisting that PT not provide physical assistance to stand. Increased time and significant effort required to power up to full stand. Once standing, pt with fair standing balance statically. Close guard provided for safety, and hands on assist provided once pt began to move, as he was very unsteady with dynamic movement. Ambulation/Gait Ambulation/Gait assistance: Min assist Gait Distance (Feet): 6 Feet Assistive device: 1 person hand held assist Gait Pattern/deviations: Step-to pattern, Decreased stride length, Decreased weight shift to right, Decreased stance time - right, Decreased step length - right, Trunk flexed, Narrow base of support General Gait Details: Pt with heavy reliance on UE's for support, reaching for bedside table and other furniture to brace himself on. Declining PT assist and RW, however due to extreme risk of falling, PT insisted on HHA and gait belt. Pt agreeable to using RW next session. Gait velocity: Decreased Gait velocity interpretation: <1.31 ft/sec, indicative of household ambulator   ADL:   Cognition: Cognition Orientation Level: Oriented X4 Cognition Arousal: Alert Behavior During Therapy: WFL for tasks assessed/performed     Review of Systems  Constitutional:  Positive for malaise/fatigue. Negative for fever.  HENT: Negative.    Eyes: Negative.   Respiratory:  Positive for shortness of breath.   Cardiovascular: Negative.  Gastrointestinal: Negative.   Genitourinary: Negative.   Musculoskeletal:  Positive for myalgias.  Skin: Negative.    Neurological:  Positive for focal weakness and weakness.  Psychiatric/Behavioral: Negative.         Past Medical History:  Diagnosis Date   Hypertension               Past Surgical History:  Procedure Laterality Date   APPENDECTOMY       TONSILLECTOMY                 Family History  Problem Relation Age of Onset   Pancreatic cancer Mother     Hypertension Father     CAD Neg Hx     Diabetes Neg Hx          Social History:  reports that he has never smoked. He has never used smokeless tobacco. He reports that he does not drink alcohol and does not use drugs. Allergies:  Allergies  No Known Allergies         Medications Prior to Admission  Medication Sig Dispense Refill   amLODipine  (NORVASC ) 10 MG tablet Take 10 mg by mouth daily.       ascorbic acid  (VITAMIN C ) 500 MG tablet Take 500 mg by mouth daily.       aspirin  81 MG EC tablet Take 81 mg by mouth daily. Swallow whole.       B Complex-C (SUPER B COMPLEX PO) Take 1 tablet by mouth daily.       Saw Palmetto, Serenoa repens, ([employer] SAW PALMETTO PO) Take 1 tablet by mouth daily.       VITAMIN E PO Take 1 tablet by mouth daily.                Blood pressure (!) 141/60, pulse (!) 57, temperature 98.1 F (36.7 C), temperature source Oral, resp. rate 17, height 5' 10 (1.778 m), weight 68.3 kg, SpO2 98%. Physical Exam Constitutional:      General: He is not in acute distress.    Appearance: He is not ill-appearing.  HENT:     Head: Normocephalic and atraumatic.     Nose: Nose normal.  Eyes:     Pupils: Pupils are equal, round, and reactive to light.  Cardiovascular:     Rate and Rhythm: Bradycardia present.     Pulses: Normal pulses.  Pulmonary:     Effort: Pulmonary effort is normal.  Abdominal:     General: Abdomen is flat.  Musculoskeletal:        General: No swelling or tenderness.     Cervical back: Normal range of motion.     Right lower leg: No edema.     Left lower leg: No edema.     Comments:  Mid to low back TTP  Skin:    General: Skin is warm.     Findings: Bruising present.  Neurological:     Mental Status: He is alert.     Comments: Alert and oriented x 3. Normal insight and awareness. Intact Memory. Normal language and speech. Cranial nerve exam unremarkable. MMT: BUE 4/5 prox to distal. BLE 3/5 HF, KE and 4/5 ADF/PF. Sensory exam normal for light touch and pain in all 4 limbs. No limb ataxia or cerebellar signs. No abnormal tone appreciated.  SABRA    Psychiatric:        Mood and Affect: Mood normal.        Behavior: Behavior normal.  Lab Results Last 24 Hours  No results found for this or any previous visit (from the past 24 hours).   Imaging Results (Last 48 hours)  No results found.     Assessment/Plan: Diagnosis: Debility after massive pulmonary embolus and PEA Does the need for close, 24 hr/day medical supervision in concert with the patient's rehab needs make it unreasonable for this patient to be served in a less intensive setting? Yes Co-Morbidities requiring supervision/potential complications:  - Transaminitis due to hypotension -Acute kidney injury also likely due to hypotension -Hyponatremia- Anticoagulation considerations Due to bladder management, bowel management, safety, skin/wound care, disease management, medication administration, pain management, and patient education, does the patient require 24 hr/day rehab nursing? Yes Does the patient require coordinated care of a physician, rehab nurse, therapy disciplines of PT, OT to address physical and functional deficits in the context of the above medical diagnosis(es)? Yes Addressing deficits in the following areas: balance, endurance, locomotion, strength, transferring, bowel/bladder control, bathing, dressing, feeding, grooming, toileting, and psychosocial support Can the patient actively participate in an intensive therapy program of at least 3 hrs of therapy per day at least 5 days per week?  Yes The potential for patient to make measurable gains while on inpatient rehab is excellent Anticipated functional outcomes upon discharge from inpatient rehab are modified independent  with PT, modified independent with OT, n/a with SLP. Estimated rehab length of stay to reach the above functional goals is: 7 days Anticipated discharge destination: Home Overall Rehab/Functional Prognosis: excellent   POST ACUTE RECOMMENDATIONS: This patient's condition is appropriate for continued rehabilitative care in the following setting: CIR Patient has agreed to participate in recommended program. Yes Note that insurance prior authorization may be required for reimbursement for recommended care.   Comment: Mr. Blann was amazingly active prior to this admission. While rehab goals are mod I, he shouldn't go home initially without someone in the house with him. Fortunately, he has some family who can assist him once he's home. Rehab Admissions Coordinator to follow up.     I have personally performed a face to face diagnostic evaluation of this patient. Additionally, I have examined the patient's medical record including any pertinent labs and radiographic images.     Thanks,   Arthea ONEIDA Gunther, MD 01/16/2024           Routing History

## 2024-01-17 NOTE — Progress Notes (Addendum)
 Inpatient Rehab Admissions Coordinator:    I have insurance approval and a bed available for pt to admit to CIR today. Dr. Drusilla in agreement and [facility] aware.  I've notified pt/family and they are in agreement to admit today.  I will make arrangements.     Reche Lowers, PT, DPT Admissions Coordinator [phone] 01/17/24  12:20 PM

## 2024-01-17 NOTE — Discharge Summary (Signed)
 Physician Discharge Summary   Patient: Don Howard MRN: [id number] DOB: 1926-12-09  Admit date:     01/11/2024  Discharge date: 01/17/24  Discharge Physician: Sabas GORMAN Brod   PCP: Kip Righter, MD   Recommendations at discharge:   Take apixaban  10 mg p.o. twice daily for 3 days till 01/19/2024 Then start taking apixaban  5 mg twice daily from 8/16/205   Discharge Diagnoses: Principal Problem:   Cardiac arrest [facility])  Resolved Problems:   * No resolved hospital problems. *  Hospital Course: 89 year old male with a history of hypertension, bradycardia, and PVCs, followed by cardiology, presented with PE s/p TPA   Assessment and Plan:  AMS (present on admission-- resolved) -CVA (ruled out)   Acute hypoxic respiratory failure due to PE status post half dose tPA currently on heparin  - now on elquis  - No clear provoking factor for PE will consider lifelong anticoagulation. -Will discontinue aspirin  on discharge as per cardiology recommendation from 01/14/2024 - Patient to go to skilled nursing facility for rehab   RV strain/Obstructive shock/RV strain in the setting of massive PE.   - Cardiology was consulted, plan for repeat echocardiogram as outpatient     Transaminits (improving) - Likely due to hypotension  - Follow LFTs as outpatient   AKI  - Likely prerenal improved with hydration    Hyponatremia --mild - Improving       Consultants: Cardiology Procedures performed:   Disposition: Skilled nursing facility Diet recommendation:  Discharge Diet Orders (From admission, onward)     Start     Ordered   01/17/24 0000  Diet - low sodium heart healthy        01/17/24 1220           Regular diet DISCHARGE MEDICATION: Allergies as of 01/17/2024   No Known Allergies      Medication List     STOP taking these medications    aspirin  EC 81 MG tablet       TAKE these medications    amLODipine  5 MG tablet Commonly known as: NORVASC  Take 1 tablet  (5 mg total) by mouth daily. Start taking on: January 18, 2024 What changed:  medication strength how much to take   apixaban  5 MG Tabs tablet Commonly known as: ELIQUIS  Take 2 tablets (10 mg total) by mouth 2 (two) times daily for 3 days, THEN 1 tablet (5 mg total) 2 (two) times daily. Start taking on: January 17, 2024   ascorbic acid  500 MG tablet Commonly known as: VITAMIN C  Take 500 mg by mouth daily.   [employer] SAW PALMETTO PO Take 1 tablet by mouth daily.   polyethylene glycol 17 g packet Commonly known as: MIRALAX  / GLYCOLAX  Take 17 g by mouth daily as needed for moderate constipation.   SUPER B COMPLEX PO Take 1 tablet by mouth daily.   VITAMIN E PO Take 1 tablet by mouth daily.        Discharge Exam: Filed Weights   01/14/24 0402 01/16/24 0404 01/17/24 0507  Weight: 67.2 kg 68.3 kg 65.8 kg   General-appears in no acute distress Heart-S1-S2, regular, no murmur auscultated Lungs-clear to auscultation bilaterally, no wheezing or crackles auscultated Abdomen-soft, nontender, no organomegaly Extremities-no edema in the lower extremities Neuro-alert, oriented x3, no focal deficit noted  Condition at discharge: good  The results of significant diagnostics from this hospitalization (including imaging, microbiology, ancillary and laboratory) are listed below for reference.   Imaging Studies: DG CHEST PORT 1 VIEW Result  Date: 01/13/2024 CLINICAL DATA:  Fever. EXAM: PORTABLE CHEST 1 VIEW COMPARISON:  Radiograph and CT 01/11/2024 FINDINGS: Low lung volumes persist. Increased opacity in the right infrahilar lung with small right pleural effusion. There is also a small left pleural effusion with ill-defined basilar opacity. Stable heart size and mediastinal contours. No pneumothorax. No pulmonary edema. IMPRESSION: 1. Bibasilar opacities, right greater than left are new from prior exam. This may represent pneumonia or aspiration. 2. Small pleural effusions. Electronically Signed    By: Andrea Gasman M.D.   On: 01/13/2024 11:10   CT HEAD WO CONTRAST ( ) Result Date: 01/12/2024 CLINICAL DATA:  Stroke, follow up EXAM: CT HEAD WITHOUT CONTRAST TECHNIQUE: Contiguous axial images were obtained from the base of the skull through the vertex without intravenous contrast. RADIATION DOSE REDUCTION: This exam was performed according to the departmental dose-optimization program which includes automated exposure control, adjustment of the mA and/or kV according to patient size and/or use of iterative reconstruction technique. COMPARISON:  CT head 01/11/2024 FINDINGS: Brain: Patchy and confluent areas of decreased attenuation are noted throughout the deep and periventricular white matter of the cerebral hemispheres bilaterally, compatible with chronic microvascular ischemic disease. No evidence of large-territorial acute infarction. No parenchymal hemorrhage. No mass lesion. No extra-axial collection. No mass effect or midline shift. No hydrocephalus. Basilar cisterns are patent. Vascular: No hyperdense vessel. Skull: No acute fracture or focal lesion. Sinuses/Orbits: Paranasal sinuses and mastoid air cells are clear. Bilateral lens replacement. Otherwise the orbits are unremarkable. Other: None. IMPRESSION: No acute intracranial abnormality. Electronically Signed   By: Morgane  Naveau M.D.   On: 01/12/2024 21:08   VAS US  LOWER EXTREMITY VENOUS (DVT) Result Date: 01/12/2024  Lower Venous DVT Study Patient Name:  LEMAR BAKOS Haxton  Date of Exam:   01/12/2024 Medical Rec #: [id number]       Accession #:    [id number] Date of Birth: 11-12-1926        Patient Gender: M Patient Age:   89 years Exam Location:  [facility] Procedure:      VAS US  LOWER EXTREMITY VENOUS (DVT) Referring Phys: SAMMI GORE --------------------------------------------------------------------------------  Indications: Pulmonary embolism.  Comparison Study: No previous exams Performing Technologist: Jody Hill RVT, RDMS   Examination Guidelines: A complete evaluation includes B-mode imaging, spectral Doppler, color Doppler, and power Doppler as needed of all accessible portions of each vessel. Bilateral testing is considered an integral part of a complete examination. Limited examinations for reoccurring indications may be performed as noted. The reflux portion of the exam is performed with the patient in reverse Trendelenburg.  +---------+---------------+---------+-----------+----------+--------------+ RIGHT    CompressibilityPhasicitySpontaneityPropertiesThrombus Aging +---------+---------------+---------+-----------+----------+--------------+ CFV      Full           Yes      Yes                                 +---------+---------------+---------+-----------+----------+--------------+ SFJ      Full                                                        +---------+---------------+---------+-----------+----------+--------------+ FV Prox  Full           Yes      Yes                                 +---------+---------------+---------+-----------+----------+--------------+  FV Mid   Full           Yes      Yes                                 +---------+---------------+---------+-----------+----------+--------------+ FV DistalFull           Yes      Yes                                 +---------+---------------+---------+-----------+----------+--------------+ PFV      Full                                                        +---------+---------------+---------+-----------+----------+--------------+ POP      Full           Yes      Yes                               +---------+---------------+---------+-----------+----------+--------------+ PTV      Full                                                        +---------+---------------+---------+-----------+----------+--------------+ PERO     Full                                                         +---------+---------------+---------+-----------+----------+--------------+   limited visualization due to mobility limitations  +---------+---------------+---------+-----------+----------+--------------+ LEFT     CompressibilityPhasicitySpontaneityPropertiesThrombus Aging +---------+---------------+---------+-----------+----------+--------------+ CFV      Full           Yes      Yes                                 +---------+---------------+---------+-----------+----------+--------------+ SFJ      Full                                                        +---------+---------------+---------+-----------+----------+--------------+ FV Prox  Full           Yes      Yes                                 +---------+---------------+---------+-----------+----------+--------------+ FV Mid   Full           Yes      Yes                                 +---------+---------------+---------+-----------+----------+--------------+ FV DistalFull  Yes      Yes                                 +---------+---------------+---------+-----------+----------+--------------+ PFV      Full                                                        +---------+---------------+---------+-----------+----------+--------------+ POP      Full           Yes      Yes                                 +---------+---------------+---------+-----------+----------+--------------+ PTV      Full                                                        +---------+---------------+---------+-----------+----------+--------------+ PERO     Full                                                        +---------+---------------+---------+-----------+----------+--------------+     Summary: BILATERAL: - No evidence of deep vein thrombosis seen in the lower extremities, bilaterally. - RIGHT: - No cystic structure found in the popliteal fossa.  LEFT: - A cystic structure is found in the popliteal  fossa 4.19 x 0.87 x 2.81 cm.  *See table(s) above for measurements and observations. Electronically signed by Norman Serve on 01/12/2024 at 4:41:28 PM.    Final    US  Abdomen Limited RUQ (LIVER/GB) Result Date: 01/11/2024 CLINICAL DATA:  Transaminitis EXAM: ULTRASOUND ABDOMEN LIMITED RIGHT UPPER QUADRANT COMPARISON:  CT 03/04/2021 FINDINGS: Gallbladder: Multiple shadowing gallstones. Wall thickness at 2.3 mm. Negative sonographic Murphy. Positive for pericholecystic fluid. Common bile duct: Diameter: 3.3 mm Liver: No focal hepatic abnormality. The liver appears slightly echogenic on some of the images. Portal vein is patent on color Doppler imaging with normal direction of blood flow towards the liver. Other: None. IMPRESSION: 1. Cholelithiasis with pericholecystic fluid but negative sonographic Murphy. If there is concern for acute cholecystitis, correlation with nuclear medicine hepatobiliary imaging could be obtained. 2. Possible slight increased echogenicity of liver as may be seen with steatosis. Electronically Signed   By: Luke Bun M.D.   On: 01/11/2024 21:59   ECHOCARDIOGRAM COMPLETE Result Date: 01/11/2024    ECHOCARDIOGRAM REPORT   Patient Name:   ZAID TOMES Autrey Date of Exam: 01/11/2024 Medical Rec #:  [id number]      Height:       70.0 in Accession #:    [id number]     Weight:       138.9 lb Date of Birth:  1926-10-27       BSA:          1.788 m Patient Age:    89 years       BP:  124/88 mmHg Patient Gender: M              HR:           104 bpm. Exam Location:  Inpatient Procedure: 2D Echo, Color Doppler, Cardiac Doppler and Intracardiac            Opacification Agent (Both Spectral and Color Flow Doppler were            utilized during procedure). Indications:    Shock R57.9  History:        Patient has prior history of Echocardiogram examinations, most                 recent 10/13/2022. Risk Factors:Hypertension.  Sonographer:    Jayson Gaskins Referring Phys: [id number] ARTIST POUCH IMPRESSIONS   1. Left ventricular ejection fraction, by estimation, is 60 to 65%. The left ventricle has normal function. The left ventricle has no regional wall motion abnormalities. There is mild concentric left ventricular hypertrophy. Left ventricular diastolic function could not be evaluated.  2. Right ventricular systolic function is severely reduced. The right ventricular size is moderately enlarged. Tricuspid regurgitation signal is inadequate for assessing PA pressure.  3. The mitral valve is normal in structure. No evidence of mitral valve regurgitation. No evidence of mitral stenosis.  4. The aortic valve is calcified. There is moderate calcification of the aortic valve. There is moderate thickening of the aortic valve. Aortic valve regurgitation is mild. Aortic valve sclerosis/calcification is present, without any evidence of aortic stenosis. Aortic valve area, by VTI measures 2.70 cm. Aortic valve mean gradient measures 2.0 mmHg. Aortic valve Vmax measures 0.93 m/s. FINDINGS  Left Ventricle: Left ventricular ejection fraction, by estimation, is 60 to 65%. The left ventricle has normal function. The left ventricle has no regional wall motion abnormalities. Definity  contrast agent was given IV to delineate the left ventricular  endocardial borders. The left ventricular internal cavity size was normal in size. There is mild concentric left ventricular hypertrophy. Left ventricular diastolic function could not be evaluated. Right Ventricle: The right ventricular size is moderately enlarged. No increase in right ventricular wall thickness. Right ventricular systolic function is severely reduced. Tricuspid regurgitation signal is inadequate for assessing PA pressure. Left Atrium: Left atrial size was normal in size. Right Atrium: Right atrial size was normal in size. Pericardium: There is no evidence of pericardial effusion. Mitral Valve: The mitral valve is normal in structure. No evidence of mitral valve regurgitation.  No evidence of mitral valve stenosis. Tricuspid Valve: The tricuspid valve is normal in structure. Tricuspid valve regurgitation is mild . No evidence of tricuspid stenosis. Aortic Valve: The aortic valve is calcified. There is moderate calcification of the aortic valve. There is moderate thickening of the aortic valve. Aortic valve regurgitation is mild. Aortic valve sclerosis/calcification is present, without any evidence of aortic stenosis. Aortic valve mean gradient measures 2.0 mmHg. Aortic valve peak gradient measures 3.5 mmHg. Aortic valve area, by VTI measures 2.70 cm. Pulmonic Valve: The pulmonic valve was normal in structure. Pulmonic valve regurgitation is mild. No evidence of pulmonic stenosis. Aorta: The aortic root is normal in size and structure. Venous: The inferior vena cava was not well visualized. IAS/Shunts: No atrial level shunt detected by color flow Doppler.  LEFT VENTRICLE PLAX 2D LVIDd:         3.80 cm LVIDs:         2.90 cm LV PW:         1.20 cm LV IVS:  1.30 cm LVOT diam:     1.80 cm LV SV:         40 LV SV Index:   22 LVOT Area:     2.54 cm  RIGHT VENTRICLE RV Basal diam:  5.30 cm RV Mid diam:    4.50 cm RV S prime:     10.00 cm/s TAPSE (M-mode): 1.8 cm LEFT ATRIUM             Index LA Vol (A2C):   16.7 ml 9.34 ml/m LA Vol (A4C):   24.7 ml 13.82 ml/m LA Biplane Vol: 20.5 ml 11.47 ml/m  AORTIC VALVE AV Area (Vmax):    2.76 cm AV Area (Vmean):   2.09 cm AV Area (VTI):     2.70 cm AV Vmax:           93.20 cm/s AV Vmean:          70.600 cm/s AV VTI:            0.148 m AV Peak Grad:      3.5 mmHg AV Mean Grad:      2.0 mmHg LVOT Vmax:         101.00 cm/s LVOT Vmean:        58.000 cm/s LVOT VTI:          0.157 m LVOT/AV VTI ratio: 1.06  AORTA Ao Root diam: 3.00 cm  SHUNTS Systemic VTI:  0.16 m Systemic Diam: 1.80 cm Wilbert Bihari MD Electronically signed by Wilbert Bihari MD Signature Date/Time: 01/11/2024/5:52:59 PM    Final    CT Angio Chest Pulmonary Embolism (PE) W or WO  Contrast Result Date: 01/11/2024 CLINICAL DATA:  Pulmonary embolism (PE) suspected, high prob. Cardiac arrest. EXAM: CT ANGIOGRAPHY CHEST WITH CONTRAST TECHNIQUE: Multidetector CT imaging of the chest was performed using the standard protocol during bolus administration of intravenous contrast. Multiplanar CT image reconstructions and MIPs were obtained to evaluate the vascular anatomy. RADIATION DOSE REDUCTION: This exam was performed according to the departmental dose-optimization program which includes automated exposure control, adjustment of the mA and/or kV according to patient size and/or use of iterative reconstruction technique. CONTRAST:  70mL OMNIPAQUE  IOHEXOL  350 MG/ML SOLN COMPARISON:  CT angiography chest from 08/30/2017. FINDINGS: Cardiovascular: There is moderate to large volume acute pulmonary embolism. There are nonocclusive filling defects in the left upper lobe lobar pulmonary artery branch with extension into the segmental branches. There is occlusive thrombus in the lingular segment branch. There is nonocclusive thrombus in the left lung lower lobe lobar pulmonary artery branch with occlusive and nonocclusive filling defects in the left lobe lower lobe segmental and subsegmental branches. There are occlusive and nonocclusive filling defects in the right upper lobe, middle lobe and right lower lobe segmental and subsegmental pulmonary artery branches. There is associated right heart strain/failure. No lung infarction. There is dilation of the main pulmonary trunk measuring up to 3.2 cm, which is nonspecific but can be seen with pulmonary artery hypertension. Top-normal heart size. No pericardial effusion. No aortic aneurysm. There are coronary artery calcifications, in keeping with coronary artery disease. There are also mild peripheral atherosclerotic vascular calcifications of thoracic aorta and its major branches. Mediastinum/Nodes: Visualized thyroid  gland appears grossly unremarkable. No  solid / cystic mediastinal masses. The esophagus is nondistended precluding optimal assessment. No axillary, mediastinal or hilar lymphadenopathy by size criteria. Lungs/Pleura: The central tracheo-bronchial tree is patent. There are patchy areas of linear, plate-like atelectasis and/or scarring throughout bilateral lungs. No mass or consolidation.  No pleural effusion or pneumothorax. No suspicious lung nodules. Upper Abdomen: Visualized upper abdominal viscera within normal limits. Musculoskeletal: Several foci of air noted in the bilateral brachiocephalic veins, likely related to peripheral IV access. The visualized soft tissues of the chest wall are grossly unremarkable. No suspicious osseous lesions. There are mild to moderate multilevel degenerative changes in the visualized spine. There is acute undisplaced fracture of the upper body of the sternum (series 8, image 98 and series 9, image 95). There also undisplaced fractures of the right fourth through seventh costal cartilages. There is also buckling of the right anteromedial sixth rib, for which underlying acute fracture cannot be excluded. Review of the MIP images confirms the above findings. IMPRESSION: 1. There is moderate to large volume acute pulmonary embolism with associated right heart strain/failure. No lung infarction. 2. Upper sternal body and multiple right-sided costal cartilage/rib fractures, as described above. 3. Multiple other nonacute observations, as described above. Aortic Atherosclerosis (ICD10-I70.0). Critical Value/emergent results were discussed in person, at the time of interpretation on 01/11/2024 at 3:52 pm to provider Dr. Meade. Electronically Signed   By: Ree Molt M.D.   On: 01/11/2024 16:07   DG Chest Portable 1 View Result Date: 01/11/2024 CLINICAL DATA:  CPR EXAM: PORTABLE CHEST 1 VIEW COMPARISON:  08/30/2017 FINDINGS: Heart and mediastinal contours within normal limits. Bibasilar opacities, favor atelectasis. No  effusions. No acute bony abnormality. Aortic atherosclerosis. No visible rib fracture or pneumothorax. IMPRESSION: Bibasilar opacities, favor atelectasis. Electronically Signed   By: Franky Crease M.D.   On: 01/11/2024 13:25   CT ANGIO HEAD NECK W WO CM (CODE STROKE) Result Date: 01/11/2024 EXAM: CTA HEAD AND NECK WITHOUT AND WITH 01/11/2024 11:27:00 AM TECHNIQUE: CTA of the head and neck was performed without and with the administration of intravenous contrast. Multiplanar 2D and/or 3D reformatted images are provided for review. Automated exposure control, iterative reconstruction, and/or weight based adjustment of the mA/kV was utilized to reduce the radiation dose to as low as reasonably achievable. Stenosis of the internal carotid arteries measured using NASCET criteria. COMPARISON: None available CLINICAL HISTORY: Neuro deficit, acute, stroke suspected. Code stroke. Dr. Arora; Left sided gaze, aphasia. FINDINGS: CTA NECK: AORTIC ARCH AND ARCH VESSELS: The aortic arch demonstrates mild calcific atheromatous disease. There is common origin of the brachiocephalic artery and left common carotid artery. No dissection or arterial injury. No significant stenosis of the brachiocephalic or subclavian arteries. CERVICAL CAROTID ARTERIES: There is mild calcific plaque present within the carotid bulbs bilaterally, with less than 20% stenosis bilaterally. The cervical segments of the internal carotid arteries are otherwise normal in caliber. No dissection or arterial injury. CERVICAL VERTEBRAL ARTERIES: The vertebral arteries are widely patent. No dissection, arterial injury, or significant stenosis. LUNGS AND MEDIASTINUM: Unremarkable. SOFT TISSUES: No acute abnormality. BONES: No acute abnormality. CTA HEAD: ANTERIOR CIRCULATION: No significant stenosis of the internal carotid arteries. No significant stenosis of the anterior cerebral arteries. No significant stenosis of the middle cerebral arteries. No aneurysm. There  is mild calcific plaque within the carotid siphons, but no appreciable stenosis. POSTERIOR CIRCULATION: No significant stenosis of the posterior cerebral arteries. No significant stenosis of the basilar artery. No significant stenosis of the vertebral arteries. No aneurysm. OTHER: No dural venous sinus thrombosis on this non-dedicated study. Please note the above findings were communicated to Dr. Voncile via the [facility] paging service at 11:35 am 01/11/2024. IMPRESSION: 1. No large vessel occlusion, hemodynamically significant stenosis, or aneurysm in the head or neck. 2. Mild  calcific plaque within the carotid bulbs bilaterally, with less than 20% stenosis bilaterally. Electronically signed by: evalene coho 01/11/2024 11:41 AM EDT RP Workstation: [id number]   CT HEAD CODE STROKE WO CONTRAST Result Date: 01/11/2024 CLINICAL DATA:  Code stroke. EXAM: CT HEAD WITHOUT CONTRAST TECHNIQUE: Contiguous axial images were obtained from the base of the skull through the vertex without intravenous contrast. RADIATION DOSE REDUCTION: This exam was performed according to the departmental dose-optimization program which includes automated exposure control, adjustment of the mA and/or kV according to patient size and/or use of iterative reconstruction technique. COMPARISON:  None Available. FINDINGS: Brain: There is extensive chronic low-attenuation in the white matter Vascular: No dense vessel Skull: Normal. Negative for fracture or focal lesion. Sinuses/Orbits: No acute finding. Other: No hemorrhage ASPECTS ([place]berta Stroke Program Early CT Score) - Ganglionic level infarction (caudate, lentiform nuclei, internal capsule, insula, M1-M3 cortex): 7 - Supraganglionic infarction (M4-M6 cortex): 3 Total score (0-10 with 10 being normal): 10 IMPRESSION: 1. Extensive chronic white matter abnormalities. No dense vessel or hemorrhage. 2. ASPECTS is 10 Electronically Signed   By: Nancyann Burns M.D.   On: 01/11/2024 11:29     Microbiology: Results for orders placed or performed during the hospital encounter of 01/11/24  Resp panel by RT-PCR (RSV, Flu A&B, Covid) Anterior Nasal Swab     Status: None   Collection Time: 01/11/24 12:23 PM   Specimen: Anterior Nasal Swab  Result Value Ref Range Status   SARS Coronavirus 2 by RT PCR NEGATIVE NEGATIVE Final   Influenza A by PCR NEGATIVE NEGATIVE Final   Influenza B by PCR NEGATIVE NEGATIVE Final    Comment: (NOTE) The Xpert Xpress SARS-CoV-2/FLU/RSV plus assay is intended as an aid in the diagnosis of influenza from Nasopharyngeal swab specimens and should not be used as a sole basis for treatment. Nasal washings and aspirates are unacceptable for Xpert Xpress SARS-CoV-2/FLU/RSV testing.  Fact Sheet for Patients: BloggerCourse.com  Fact Sheet for Healthcare Providers: SeriousBroker.it  This test is not yet approved or cleared by the [place]  FDA and has been authorized for detection and/or diagnosis of SARS-CoV-2 by FDA under an Emergency Use Authorization (EUA). This EUA will remain in effect (meaning this test can be used) for the duration of the COVID-19 declaration under Section 564(b)(1) of the Act, 21 U.S.C. section 360bbb-3(b)(1), unless the authorization is terminated or revoked.     Resp Syncytial Virus by PCR NEGATIVE NEGATIVE Final    Comment: (NOTE) Fact Sheet for Patients: BloggerCourse.com  Fact Sheet for Healthcare Providers: SeriousBroker.it  This test is not yet approved or cleared by the [place]  FDA and has been authorized for detection and/or diagnosis of SARS-CoV-2 by FDA under an Emergency Use Authorization (EUA). This EUA will remain in effect (meaning this test can be used) for the duration of the COVID-19 declaration under Section 564(b)(1) of the Act, 21 U.S.C. section 360bbb-3(b)(1), unless the authorization is  terminated or revoked.  Performed at [facility], [address] [place], [place] [address]   Blood Culture (routine x 2)     Status: None   Collection Time: 01/11/24 12:23 PM   Specimen: BLOOD  Result Value Ref Range Status   Specimen Description BLOOD SITE NOT SPECIFIED  Final   Special Requests   Final    BOTTLES DRAWN AEROBIC AND ANAEROBIC Blood Culture adequate volume   Culture   Final    NO GROWTH 5 DAYS Performed at [facility], [address] [place], [place] [address]    Report Status 01/16/2024 FINAL  Final  Blood Culture (routine x 2)     Status: None   Collection Time: 01/11/24 12:35 PM   Specimen: BLOOD  Result Value Ref Range Status   Specimen Description BLOOD SITE NOT SPECIFIED  Final   Special Requests   Final    BOTTLES DRAWN AEROBIC AND ANAEROBIC Blood Culture adequate volume   Culture   Final    NO GROWTH 5 DAYS Performed at [facility], [address] [place], [place] [address]    Report Status 01/16/2024 FINAL  Final  MRSA Next Gen by PCR, Nasal     Status: None   Collection Time: 01/11/24  4:05 PM   Specimen: Nasal Mucosa; Nasal Swab  Result Value Ref Range Status   MRSA by PCR Next Gen NOT DETECTED NOT DETECTED Final    Comment: (NOTE) The GeneXpert MRSA Assay (FDA approved for NASAL specimens only), is one component of a comprehensive MRSA colonization surveillance program. It is not intended to diagnose MRSA infection nor to guide or monitor treatment for MRSA infections. Test performance is not FDA approved in patients less than 52 years old. Performed at [facility], [address], [place], [place] [address]     Labs: CBC: Recent Labs  Lab 01/11/24 1223 01/11/24 1853 01/12/24 0329 01/13/24 0304 01/15/24 0736  WBC 22.1* 13.9* 9.8 8.7 8.2  NEUTROABS 18.4*  --   --   --   --   HGB 12.2* 11.8* 10.2* 10.0* 11.5*  HCT 36.9* 35.4* 30.3* 29.4* 33.3*  MCV 95.6 94.7 93.8 92.7 92.2  PLT 203 196 167 141* 260    Basic Metabolic Panel: Recent Labs  Lab 01/11/24 1109 01/11/24 1223 01/12/24 0329 01/13/24 0304 01/15/24 0736  NA 134* 132* 138 132* 134*  K 3.8 3.5 3.9 3.6 4.1  CL 100 99 106 105 102  CO2  --  18* 20* 20* 23  GLUCOSE 155* 190* 102* 107* 95  BUN 29* 28* 24* 20 9  CREATININE 1.50* 1.69* 1.29* 0.91 0.78  CALCIUM   --  7.9* 8.1* 7.8* 8.2*  MG  --  2.2 1.8  --   --   PHOS  --   --  3.6  --   --    Liver Function Tests: Recent Labs  Lab 01/11/24 1223 01/12/24 0329 01/13/24 0304  AST 219* 114* 80*  ALT 157* 111* 86*  ALKPHOS 50 44 44  BILITOT 1.8* 0.8 0.4  PROT 5.5* 4.6* 4.7*  ALBUMIN 2.9* 2.4* 2.3*   CBG: Recent Labs  Lab 01/12/24 1508 01/12/24 1917 01/12/24 2314 01/13/24 0311 01/13/24 0801  GLUCAP 119* 118* 110* 97 105*    Discharge time spent: greater than 30 minutes.  Signed: Sabas GORMAN Brod, MD [facility] 01/17/2024

## 2024-01-17 NOTE — Progress Notes (Signed)
 Urbano Albright, MD  Physician Physical Medicine and Rehabilitation   PMR Pre-admission     Signed   Date of Service: 01/16/2024 12:31 PM  Related encounter: ED to Hosp-Admission (Discharged) from 01/11/2024 in [facility]  SURGICAL   Signed     Expand All Collapse All  PMR Admission Coordinator Pre-Admission Assessment   Patient: Don Howard is an 89 y.o., male MRN: [id number] DOB: 27-Jun-1926 Height: 5' 10 (177.8 cm) Weight: 68.3 kg                                                                                                                                                  Insurance Information HMO:     PPO: yes     PCP:      IPA:      80/20:      OTHER:  PRIMARY: BCBS Medicare      Policy#: [id number]       Subscriber: pt CM Name: Clotilda      Phone#: [phone]     Fax#: [phone] Pre-Cert#: [id number] auth for CIR from [place] with BCBS Medicare with updates due to her on 8/19      Employer:  Benefits:  Phone #: [phone]     Name:  Eff. Date: 06/07/23     Deduct: $0      Out of Pocket Max: $3900 (met $580 )      Life Max: n/a  CIR: $295/day for days 1-5      SNF: 20 full days Outpatient:      Co-Pay: $25/day Home Health: 100%      Co-Pay:  DME: 80%     Co-Pay: 20% Providers:  SECONDARY:       Policy#:       Phone#:    Artist:       Phone#:    The Engineer, materials Information Summary" for patients in Inpatient Rehabilitation Facilities with attached "Privacy Act Statement-Health Care Records" was provided and verbally reviewed with: Patient and Family   Emergency Contact Information Contact Information       Name Relation Home Work Mobile    Rivest,[name] Grandson     [phone]    Savan, Ruta     [phone]         Other Contacts   None on File      Current Medical History  Patient Admitting Diagnosis: debility following massive PE with PEA arrest    History of Present Illness: Pt is a 89  y/o male with PMH of CAD, HTN, and bradycardia with several day history of abdominal pain and not feeling well who presented to [facility] on 01/11/24 with aphasia and syncope.  EMS noted pt to be bradycardic and new left gaze preference and aphasia.  Pt lost pulses with EMS and received  2 rounds of CPR before ROSC.  No meds.  EKG showed R BBB with T wave inversions, ?rapid afib, and frequent ectopy.  In ED he was hypotensive requiring pressors and fluid resuscitation.  Neurology was consulted on arrival for code stroke with NIHSS 5.  Cardiology consulted as well.  PCCM admitted for cardiac arrest with syncope with high probability for massive PE, shock, and AKI.  Stat CTA obtained which confirmed bilateral moderate to large PE burden at the subsegmental level with some fully occluding some partially occluding and right heart strain.  He received half dose TNK and started on heparin  gtt.  CVA was ruled out.  He was transitioned to eliquis .  Therapy evaluations completed and pt was recommended for CIR due to functional decline.    Complete NIHSS TOTAL: 0 Glasgow Coma Scale Score: 15   Patient's medical record from [facility] has been reviewed by the rehabilitation admission coordinator and physician.   Past Medical History      Past Medical History:  Diagnosis Date   Hypertension            Has the patient had major surgery during 100 days prior to admission? No   Family History  family history includes Hypertension in his father; Pancreatic cancer in his mother.     Current Medications   Current Medications    Current Facility-Administered Medications:    acetaminophen  (TYLENOL ) tablet 650 mg, 650 mg, Oral, Q6H PRN, Hattar, Zola SAILOR, MD, 650 mg at 01/13/24 1601   amLODipine  (NORVASC ) tablet 5 mg, 5 mg, Oral, Daily, Vann, Jessica U, DO, 5 mg at 01/16/24 1157   apixaban  (ELIQUIS ) tablet 10 mg, 10 mg, Oral, BID, 10 mg at 01/16/24 1156 **FOLLOWED BY** [START ON 01/20/2024] apixaban  (ELIQUIS )  tablet 5 mg, 5 mg, Oral, BID, Hattar, Zola SAILOR, MD   aspirin  chewable tablet 81 mg, 81 mg, Oral, Daily, Desai, Rahul P, PA-C, 81 mg at 01/16/24 1157   docusate sodium  (COLACE) capsule 100 mg, 100 mg, Oral, BID PRN, Desai, Rahul P, PA-C   melatonin tablet 5 mg, 5 mg, Oral, QHS PRN, Paliwal, Aditya, MD, 5 mg at 01/11/24 2221   polyethylene glycol (MIRALAX  / GLYCOLAX ) packet 17 g, 17 g, Oral, Daily PRN, Desai, Rahul P, PA-C     Patients Current Diet:  Diet Order                  Diet Heart Room service appropriate? Yes; Fluid consistency: Thin  Diet effective now                         Precautions / Restrictions Precautions Precautions: Fall Precaution/Restrictions Comments: Poor safety awareness, does  not like hands on assist. Wants to do it himself without an AD but at a very high risk of falls. Restrictions Weight Bearing Restrictions Per Provider Order: No    Has the patient had 2 or more falls or a fall with injury in the past year?No   Prior Activity Level Community (5-7x/wk): mod I with cane PTA, driving, mowing his yard   Prior Functional Level Prior Function Prior Level of Function : Independent/Modified Independent   Self Care: Did the patient need help bathing, dressing, using the toilet or eating?  Independent   Indoor Mobility: Did the patient need assistance with walking from room to room (with or without device)? Independent   Stairs: Did the patient need assistance with internal or external stairs (with or without device)?  Independent   Functional Cognition: Did the patient need help planning regular tasks such as shopping or remembering to take medications? Independent   Patient Information Are you of Hispanic, Latino/a,or Spanish origin?: A. No, not of Hispanic, Latino/a, or Spanish origin What is your race?: A. White Do you need or want an interpreter to communicate with a doctor or health care staff?: 0. No   Patient's Response To:  Health Literacy  and Transportation Is the patient able to respond to health literacy and transportation needs?: Yes Health Literacy - How often do you need to have someone help you when you read instructions, pamphlets, or other written material from your doctor or pharmacy?: Never In the past 12 months, has lack of transportation kept you from medical appointments or from getting medications?: No In the past 12 months, has lack of transportation kept you from meetings, work, or from getting things needed for daily living?: No   Journalist, newspaper / Equipment Home Equipment: Grab bars - tub/shower, Medical laboratory scientific officer - single point, Agricultural consultant (2 wheels), Crutches   Prior Device Use: Indicate devices/aids used by the patient prior to current illness, exacerbation or injury? cane   Current Functional Level Cognition   Orientation Level: Oriented X4    Extremity Assessment (includes Sensation/Coordination)   Upper Extremity Assessment: Generalized weakness, Overall WFL for tasks assessed (Age appropriate strength)  Lower Extremity Assessment: RLE deficits/detail RLE Deficits / Details: Pt reports baseline arthritis in his R knee that limits him. Pt with difficulty weight bearing through the R knee due to pain this date. RLE: Unable to fully assess due to pain     ADLs         Mobility   Overal bed mobility: Needs Assistance Bed Mobility: Supine to Sit Supine to sit: Supervision General bed mobility comments: Pt motivated to complete supine>sit without assistance. Increased time and effort required with heavy use of rail to pull trunk into sitting position.     Transfers   Overall transfer level: Needs assistance Equipment used: 1 person hand held assist Transfers: Sit to/from Stand Sit to Stand: Contact guard assist General transfer comment: Pt insisting that PT not provide physical assistance to stand. Increased time and significant effort required to power up to full stand. Once standing, pt with fair  standing balance statically. Close guard provided for safety, and hands on assist provided once pt began to move, as he was very unsteady with dynamic movement.     Ambulation / Gait / Stairs / Wheelchair Mobility   Ambulation/Gait Ambulation/Gait assistance: Editor, commissioning (Feet): 6 Feet Assistive device: 1 person hand held assist Gait Pattern/deviations: Step-to pattern, Decreased stride length, Decreased weight shift to right, Decreased stance time - right, Decreased step length - right, Trunk flexed, Narrow base of support General Gait Details: Pt with heavy reliance on UE's for support, reaching for bedside table and other furniture to brace himself on. Declining PT assist and RW, however due to extreme risk of falling, PT insisted on HHA and gait belt. Pt agreeable to using RW next session. Gait velocity: Decreased Gait velocity interpretation: <1.31 ft/sec, indicative of household ambulator     Posture / Balance Balance Overall balance assessment: Needs assistance Sitting-balance support: Feet supported, No upper extremity supported Sitting balance-Leahy Scale: Fair Standing balance support: No upper extremity supported, During functional activity Standing balance-Leahy Scale: Poor     Special considerations/ Life events N/a        Previous Home Environment (from acute  therapy documentation) Living Arrangements: Alone Available Help at Discharge: Family, Available PRN/intermittently Type of Home: House Home Layout: One level Home Access: Stairs to enter Entrance Stairs-Rails: Right Entrance Stairs-Number of Steps: 2 Bathroom Shower/Tub: Tub/shower unit   Discharge Living Setting Plans for Discharge Living Setting: Patient's home (initial support from family) Type of Home at Discharge: House Discharge Home Layout: One level Discharge Home Access: Stairs to enter Entrance Stairs-Rails: Right Entrance Stairs-Number of Steps: 2 Discharge Bathroom Shower/Tub:  Tub/shower unit Discharge Bathroom Toilet: Standard Discharge Bathroom Accessibility: Yes How Accessible: Accessible via walker Does the patient have any problems obtaining your medications?: No   Social/Family/Support Systems Anticipated Caregiver: grandson, Jerrell is POA Anticipated Industrial/product designer Information: [phone] Ability/Limitations of Caregiver: none stated Caregiver Availability: 24/7 Discharge Plan Discussed with Primary Caregiver: Yes Is Caregiver In Agreement with Plan?: Yes Does Caregiver/Family have Issues with Lodging/Transportation while Pt is in Rehab?: No     Goals Patient/Family Goal for Rehab: PT/OT mod I, SLP n/a Expected length of stay: 7-9 days Additional Information: Discharge plan: return to pts home, mod I goals, but recommend family supervision initially at discharge per Dr. Babs Pt/Family Agrees to Admission and willing to participate: Yes Program Orientation Provided & Reviewed with Pt/Caregiver Including Roles  & Responsibilities: Yes     Decrease burden of Care through IP rehab admission: n/a     Possible need for SNF placement upon discharge: Not anticipated.  Plan for mod I goals with initial supervision at home to ensure safe transition from hospital>home.      Patient Condition: This patient's condition remains as documented in the consult dated 01/16/24, in which the Rehabilitation Physician determined and documented that the patient's condition is appropriate for intensive rehabilitative care in an inpatient rehabilitation facility. Will admit to inpatient rehab today.   Preadmission Screen Completed By:  Reche FORBES Lowers, PT, 01/16/2024 12:31 PM ______________________________________________________________________   Discussed status with Dr. Urbano on 01/17/24  at 12:28 PM  and received approval for admission today.   Admission Coordinator:  Akari Crysler E Kenaz Olafson, PT, DPT time 12:28 PM /Date08/13/25              Revision  History

## 2024-01-17 NOTE — H&P (Addendum)
 Physical Medicine and Rehabilitation Admission H&P     CC: Functional deficits due to debility       HPI: Don Howard is a 89 year old male with PMHx significant of hypertension, coronary artery disease, and bradycardia who initially presented to [facility] on 01/11/2024 with aphasia and syncope.  The patient called EMS himself, and upon their arrival, he was speaking with EMS but reported feeling unwell and was talking abnormally. He was noted to be bradycardic, and subsequently, he lost pulses, requiring two rounds of CPR without medications. Code stroke was activated and neurology consulted. A CT head and CTA showed no emergent findings. Labs revealed leukocytosis, lactic acidosis, and a deranged liver profile. Cardiology consulted and patient was found to have a right bundle branch block with T-wave inversions. There was a question of possible rapid atrial fibrillation (AFib). While in the emergency department, he was hypotensive, requiring pressors and fluid resuscitation and PCCM admitted for cardiac arrest with syncope with high probability of massive PE, shock, and AKI.  A STAT CTA confirmed bilateral moderate to large pulmonary embolism (PE) burden at the sub-segmental level, with some fully occluding, others partially occluding, and evidence of right heart strain. The patient received a half-dose of TNK and was started on a Heparin  drip. Cerebrovascular accident (CVA) was ruled out, and he was transitioned to Eliquis . At baseline, the patient lived independently, was still driving, and mowing his yard.  Patient reports he is taking saw palmetto for BPH, although not having significant symptoms at this time.  Patient says he was told to discontinue this medication.  Reports history of right knee OA, was offered gel injections previously was not interested at that time.  He currently requires CGA sit to stand and min assist with ambulation. Therapy evaluations completed due to  patient decreased functional mobility was admitted for a comprehensive rehab program.      Review of Systems  Constitutional:  Positive for malaise/fatigue. Negative for chills and fever.  HENT:  Positive for hearing loss (chronic).   Eyes:  Negative for blurred vision.  Respiratory:  Positive for shortness of breath. Negative for cough.   Cardiovascular:  Negative for chest pain and palpitations.  Gastrointestinal:  Negative for abdominal pain, constipation, diarrhea, nausea and vomiting.  Genitourinary: Negative.   Musculoskeletal:  Positive for joint pain and myalgias.  Skin:  Negative for rash.  Neurological:  Positive for weakness. Negative for sensory change, speech change and headaches.  Psychiatric/Behavioral:  The patient does not have insomnia.        Past Medical History:  Diagnosis Date   Hypertension               Past Surgical History:  Procedure Laterality Date   APPENDECTOMY       TONSILLECTOMY                 Family History  Problem Relation Age of Onset   Pancreatic cancer Mother     Hypertension Father     CAD Neg Hx     Diabetes Neg Hx          Social History:  reports that he has never smoked. He has never used smokeless tobacco. He reports that he does not drink alcohol and does not use drugs. Allergies:  Allergies  No Known Allergies         Medications Prior to Admission  Medication Sig Dispense Refill   amLODipine  (NORVASC ) 10  MG tablet Take 10 mg by mouth daily.       ascorbic acid  (VITAMIN C ) 500 MG tablet Take 500 mg by mouth daily.       aspirin  81 MG EC tablet Take 81 mg by mouth daily. Swallow whole.       B Complex-C (SUPER B COMPLEX PO) Take 1 tablet by mouth daily.       Saw Palmetto, Serenoa repens, ([employer] SAW PALMETTO PO) Take 1 tablet by mouth daily.       VITAMIN E PO Take 1 tablet by mouth daily.                  Home: Home Living Family/patient expects to be discharged to:: Private residence Living Arrangements:  Alone Available Help at Discharge: Family, Available PRN/intermittently Type of Home: House Home Access: Stairs to enter [employer] of Steps: 2 Entrance Stairs-Rails: Right Home Layout: One level Bathroom Shower/Tub: Engineer, manufacturing systems: Standard Home Equipment: Grab bars - tub/shower, Medical laboratory scientific officer - single point, Agricultural consultant (2 wheels), Crutches   Functional History: Prior Function Prior Level of Function : Independent/Modified Independent Mobility Comments: has ADs but reports that he does not use them ADLs Comments: Ind with ADLs, light cooking   Functional Status:  Mobility: Bed Mobility Overal bed mobility: Needs Assistance Bed Mobility: Supine to Sit Supine to sit: Supervision General bed mobility comments: pt in bathroom on commode with NT present upon OT arrival Transfers Overall transfer level: Needs assistance Equipment used: Rolling walker (2 wheels) Transfers: Sit to/from Stand Sit to Stand: Mod assist, Min assist General transfer comment: mod A from commode, min A from chair seated at sink. pt ambulated min A with RW fomr commode to 3 in 1 at sink Ambulation/Gait Ambulation/Gait assistance: Editor, commissioning (Feet): 6 Feet Assistive device: 1 person hand held assist Gait Pattern/deviations: Step-to pattern, Decreased stride length, Decreased weight shift to right, Decreased stance time - right, Decreased step length - right, Trunk flexed, Narrow base of support General Gait Details: Pt with heavy reliance on UE's for support, reaching for bedside table and other furniture to brace himself on. Declining PT assist and RW, however due to extreme risk of falling, PT insisted on HHA and gait belt. Pt agreeable to using RW next session. Gait velocity: Decreased Gait velocity interpretation: <1.31 ft/sec, indicative of household ambulator   ADL: ADL Overall ADL's : Needs assistance/impaired Eating/Feeding: Independent Grooming: Wash/dry  hands, Wash/dry face, Contact guard assist, Supervision/safety, Sitting Upper Body Bathing: Supervision/ safety, Sitting Upper Body Bathing Details (indicate cue type and reason): assit with back Lower Body Bathing: Moderate assistance Upper Body Dressing : Supervision/safety, Sitting Lower Body Dressing: Moderate assistance Toilet Transfer: Moderate assistance, Minimal assistance, Rolling walker (2 wheels), Cueing for safety Toilet Transfer Details (indicate cue type and reason): sit - stand form toilet and back to toilet Toileting- Clothing Manipulation and Hygiene: Moderate assistance, Sit to/from stand Functional mobility during ADLs: Moderate assistance, Minimal assistance, Cueing for safety, Rolling walker (2 wheels) General ADL Comments: pt seated at sink for bathing, dressing and shaving activities   Cognition: Cognition Orientation Level: Oriented X4 Cognition Arousal: Alert Behavior During Therapy: WFL for tasks assessed/performed   Physical Exam: Blood pressure 112/63, pulse 67, temperature 97.7 F (36.5 C), temperature source Oral, resp. rate 17, height 5' 10 (1.778 m), weight 65.8 kg, SpO2 97%. Physical Exam   General: No apparent distress HEENT: Head is normocephalic, atraumatic, sclera anicteric, oral mucosa pink and moist, dentition decreased  Neck: Supple without JVD or lymphadenopathy Heart: Reg rate and rhythm. No murmurs rubs or gallops Chest: CTA bilaterally without wheezes, rales, or rhonchi; no distress Abdomen: Soft, non-tender, non-distended, bowel sounds positive. Extremities: No clubbing, cyanosis, or edema.  Psych: Pt's affect is appropriate. Pt is cooperative Skin: Bruising noted, B/L UEs Neuro:     Mental Status: AAOx4, memory intact, normal insight and awareness Speech/Languate: No speech or language deficits noted, follows simple commands CRANIAL NERVES: II: PERRL. Visual fields full III, IV, VI: EOM intact, no gaze preference or deviation V:  normal sensation bilaterally VII: no asymmetry VIII: hard of hearing  IX, X: normal palatal elevation XI: 5/5 head turn and 5/5 shoulder shrug bilaterally XII: Tongue midline     MOTOR: RUE: 5/5 Deltoid, 5/5 Biceps, 5/5 Triceps,5/5 Grip LUE: 5/5 Deltoid, 5/5 Biceps, 5/5 Triceps, 5/5 Grip RLE: HF 4-/5, KE 4/5, ADF 4+/5, APF 4+/5 LLE: HF 4/5, KE 4+/5, ADF 4+/5, APF 4+/5     SENSORY: Normal to touch all 4 extremities   Coordination: Normal finger to nose b/l   No abnormal tone noted   MSK: R knee pain with ROM, atrophy intrinsic muscles of b/l hands   Lab Results Last 48 Hours  No results found for this or any previous visit (from the past 48 hours).   Imaging Results (Last 48 hours)  No results found.         Blood pressure 112/63, pulse 67, temperature 97.7 F (36.5 C), temperature source Oral, resp. rate 17, height 5' 10 (1.778 m), weight 65.8 kg, SpO2 97%.   Medical Problem List and Plan: 1. Functional deficits secondary to debility after large volume pulmonary embolism S/P TPA with cardiogenic shock and transient PEA arrest             -patient may shower             -ELOS/Goals: PT/OT mod I, 7 to 9 days             - Admit to CIR 2.  Antithrombotics: -DVT/anticoagulation:  Mechanical: Sequential compression devices, below knee Bilateral lower extremities Pharmaceutical: Eliquis , plan for lifelong anticoagulation             -antiplatelet therapy: n/a 3. Pain Management: Tylenol  as needed 4. Mood/Behavior/Sleep: LCSW to follow for evaluation and support when available.              -antipsychotic agents: n/a 5. Neuropsych/cognition: This patient is capable of making decisions on his own behalf. 6. Skin/Wound Care: Routine pressure relief measures 7. Fluids/Electrolytes/Nutrition: Monitor I's/O recheck labs in a.m.             - Heart healthy diet  8.  Acute hypoxic respiratory failure due to PE: Eliquis  10 mg BID until 01/19/2024 then Eliquis  5 mg BID. 9.   Transaminitis-improving felt to be likely due to hypotension continue to monitor 10.  AKI: Prerenal improved with hydration, recheck labs 11.  Hyponatremia: Mild NA <132<134  12.  RV strain r/t PE: Will need repeat echocardiogram as outpatient 13.  R knee pain: voltaren  gel 14.  Anemia, mild: Monitor labs 15.  BPH?  Previously on saw palmetto, likely best to hold off on this due to Eliquis .  Denies having significant symptoms at this time.   Daphne CROME Leak, NP 01/17/2024     I have personally performed a face to face diagnostic evaluation of this patient and formulated the key components of the plan.  Additionally, I have personally reviewed  laboratory data, imaging studies, as well as relevant notes and concur with the physician assistant's documentation above.   The patient's status has not changed from the original H&P.  Any changes in documentation from the acute care chart have been noted above.   Murray Collier, MD, LEELLEN

## 2024-01-17 NOTE — Progress Notes (Incomplete)
 [facility]  PROGRESS NOTE  Don Howard FMW:[id number] DOB: [birth year] DOA: 01/11/2024 PCP: Kip Righter, MD   Brief HPI:    89 year old male with a history of hypertension, bradycardia, and PVCs, followed by cardiology, presented with PE s/p TPA    Assessment/Plan:   AMS (present on admission-- resolved) -CVA (ruled out)   Acute hypoxic respiratory failure due to PE status post half dose tPA currently on heparin  - now on elquis  - No clear provoking factor for PE will consider lifelong anticoagulation. -PT eval --CIR   RV strain/Obstructive shock/RV strain in the setting of massive PE.   -He will need a repeat echocardiogram as an outpatient      Transaminits (improving) - Likely due to hypotension  -trend   AKI  - Likely prerenal improved with hydration    Hyponatremia --mild    Medications     amLODipine   5 mg Oral Daily   apixaban   10 mg Oral BID   Followed by   NOREEN ON 01/20/2024] apixaban   5 mg Oral BID   aspirin   81 mg Oral Daily     Data Reviewed:   CBG:  Recent Labs  Lab 01/12/24 1508 01/12/24 1917 01/12/24 2314 01/13/24 0311 01/13/24 0801  GLUCAP 119* 118* 110* 97 105*    SpO2: 97 % O2 Flow Rate (L/min): 2 L/min    Vitals:   01/16/24 1700 01/16/24 2304 01/17/24 0507 01/17/24 0815  BP: (!) 119/58 123/66 122/69 112/63  Pulse: 69 61 (!) 57 67  Resp: 17 17 18 17   Temp: 98.9 F (37.2 C) 97.8 F (36.6 C) 98 F (36.7 C) 97.7 F (36.5 C)  TempSrc: Oral Oral Oral Oral  SpO2: 95% 95% 95% 97%  Weight:   65.8 kg   Height:          Data Reviewed:  Basic Metabolic Panel: Recent Labs  Lab 01/11/24 1109 01/11/24 1223 01/12/24 0329 01/13/24 0304 01/15/24 0736  NA 134* 132* 138 132* 134*  K 3.8 3.5 3.9 3.6 4.1  CL 100 99 106 105 102  CO2  --  18* 20* 20* 23  GLUCOSE 155* 190* 102* 107* 95  BUN 29* 28* 24* 20 9  CREATININE 1.50* 1.69* 1.29* 0.91 0.78  CALCIUM   --  7.9* 8.1* 7.8* 8.2*  MG  --  2.2 1.8  --   --   PHOS   --   --  3.6  --   --     CBC: Recent Labs  Lab 01/11/24 1223 01/11/24 1853 01/12/24 0329 01/13/24 0304 01/15/24 0736  WBC 22.1* 13.9* 9.8 8.7 8.2  NEUTROABS 18.4*  --   --   --   --   HGB 12.2* 11.8* 10.2* 10.0* 11.5*  HCT 36.9* 35.4* 30.3* 29.4* 33.3*  MCV 95.6 94.7 93.8 92.7 92.2  PLT 203 196 167 141* 260    LFT Recent Labs  Lab 01/11/24 1223 01/12/24 0329 01/13/24 0304  AST 219* 114* 80*  ALT 157* 111* 86*  ALKPHOS 50 44 44  BILITOT 1.8* 0.8 0.4  PROT 5.5* 4.6* 4.7*  ALBUMIN 2.9* 2.4* 2.3*     Antibiotics: Anti-infectives (From admission, onward)    Start     Dose/Rate Route Frequency Ordered Stop   01/11/24 1200  vancomycin  (VANCOREADY) IVPB 1250 mg/250 mL  Status:  Discontinued        1,250 mg 166.7 mL/hr over 90 Minutes Intravenous  Once 01/11/24 1146 01/11/24 1147   01/11/24 1200  vancomycin  (  VANCOREADY) IVPB 1500 mg/300 mL  Status:  Discontinued        1,500 mg 150 mL/hr over 120 Minutes Intravenous  Once 01/11/24 1147 01/11/24 1147   01/11/24 1200  vancomycin  (VANCOREADY) IVPB 1250 mg/250 mL        1,250 mg 166.7 mL/hr over 90 Minutes Intravenous STAT 01/11/24 1147 01/11/24 1453   01/11/24 1145  ceFEPIme  (MAXIPIME ) 2 g in sodium chloride  0.9 % 100 mL IVPB        2 g 200 mL/hr over 30 Minutes Intravenous  Once 01/11/24 1143 01/11/24 1315   01/11/24 1145  metroNIDAZOLE  (FLAGYL ) IVPB 500 mg        500 mg 100 mL/hr over 60 Minutes Intravenous  Once 01/11/24 1143 01/11/24 1423   01/11/24 1145  vancomycin  (VANCOCIN ) IVPB 1000 mg/200 mL premix  Status:  Discontinued        1,000 mg 200 mL/hr over 60 Minutes Intravenous  Once 01/11/24 1143 01/11/24 1147        DVT prophylaxis: ***  Code Status: ***  Family Communication: ***   CONSULTS ***   Subjective   ***   Objective    Physical Examination:   General:  *** Cardiovascular: *** Respiratory: *** Abdomen: *** Extremities: ***  Neurologic:  ***   Status is: Inpatient:   ***           Stellar Gensel S Shams Fill   [facility] If 7PM-7AM, please contact night-coverage at [email or web address], Office  [phone]   01/17/2024, 8:59 AM  LOS: 6 days

## 2024-01-17 NOTE — Progress Notes (Signed)
 Physical Therapy Treatment  Patient Details Name: Don Howard MRN: [id number] DOB: August 26, 1926 Today's Date: 01/17/2024   History of Present Illness Pt is a 89 y/o male who presents 01/11/2024 via EMS. He was admitted with PE s/p TPA, PEA arrest, and cardiogenic shock. PMH significant for HTN.    PT Comments  Pt progressing towards physical therapy goals. Pt able to progress ambulation distance this session, utilizing RW for support and chair follow for safety. RLE deficits/pain appear decreased this session however pt reports no change subjectively. Will continue to follow and progress as able per POC.    If plan is discharge home, recommend the following: A lot of help with walking and/or transfers;A lot of help with bathing/dressing/bathroom;Assistance with cooking/housework;Assist for transportation;Help with stairs or ramp for entrance   Can travel by private vehicle     Yes  Equipment Recommendations  Other (comment) (tub bench)    Recommendations for Other Services OT consult     Precautions / Restrictions Precautions Precautions: Fall Recall of Precautions/Restrictions: Impaired Precaution/Restrictions Comments: Poor safety awareness, does  not like hands on assist. Wants to do it himself without an AD but at a very high risk of falls. Restrictions Weight Bearing Restrictions Per Provider Order: No     Mobility  Bed Mobility               General bed mobility comments: Pt was received sitting up in recliner    Transfers Overall transfer level: Needs assistance Equipment used: Rolling walker (2 wheels) Transfers: Sit to/from Stand Sit to Stand: Min assist           General transfer comment: Assist for power up to full stand. Pt motivated to complete without assistance, asking therapist not to help him. However, after several unsuccessful attempts, pt agreeable to let PT assist him.    Ambulation/Gait Ambulation/Gait assistance: Min assist Gait Distance  (Feet): 75 Feet (x2) Assistive device: Rolling walker (2 wheels) Gait Pattern/deviations: Decreased stride length, Decreased weight shift to right, Decreased stance time - right, Decreased step length - right, Trunk flexed, Narrow base of support, Step-through pattern Gait velocity: Decreased Gait velocity interpretation: <1.31 ft/sec, indicative of household ambulator   General Gait Details: Chair follow utilized for safety. Pt able to ambulate in hall with RW for support and occasional assist for balance support. 3 standing rest breaks throughout gait training.   Stairs             Wheelchair Mobility     Tilt Bed    Modified Rankin (Stroke Patients Only)       Balance Overall balance assessment: Needs assistance Sitting-balance support: Feet supported, No upper extremity supported Sitting balance-Leahy Scale: Fair     Standing balance support: No upper extremity supported, During functional activity Standing balance-Leahy Scale: Poor                              Communication Communication Communication: Impaired Factors Affecting Communication: Hearing impaired  Cognition Arousal: Alert Behavior During Therapy: WFL for tasks assessed/performed   PT - Cognitive impairments: Safety/Judgement, Memory                         Following commands: Intact      Cueing Cueing Techniques: Verbal cues, Gestural cues, Tactile cues  Exercises      General Comments        Pertinent Vitals/Pain Pain Assessment  Pain Assessment: Faces Faces Pain Scale: Hurts little more Pain Location: R knee Pain Descriptors / Indicators: Sore, Aching Pain Intervention(s): Limited activity within patient's tolerance, Monitored during session, Repositioned    Home Living                          Prior Function            PT Goals (current goals can now be found in the care plan section) Acute Rehab PT Goals Patient Stated Goal: Home alone  after AIR PT Goal Formulation: With patient Time For Goal Achievement: 01/29/24 Potential to Achieve Goals: Good Progress towards PT goals: Progressing toward goals    Frequency    Min 2X/week      PT Plan      Co-evaluation              AM-PAC PT 6 Clicks Mobility   Outcome Measure  Help needed turning from your back to your side while in a flat bed without using bedrails?: A Little Help needed moving from lying on your back to sitting on the side of a flat bed without using bedrails?: A Little Help needed moving to and from a bed to a chair (including a wheelchair)?: A Little Help needed standing up from a chair using your arms (e.g., wheelchair or bedside chair)?: A Little Help needed to walk in hospital room?: A Lot Help needed climbing 3-5 steps with a railing? : A Lot 6 Click Score: 16    End of Session Equipment Utilized During Treatment: Gait belt Activity Tolerance: Patient tolerated treatment well Patient left: in chair;with call bell/phone within reach;with chair alarm set Nurse Communication: Mobility status PT Visit Diagnosis: Unsteadiness on feet (R26.81);Pain Pain - Right/Left: Right Pain - part of body: Knee     Time: 8840-8775 PT Time Calculation (min) (ACUTE ONLY): 25 min  Charges:    $Gait Training: 23-37 mins PT General Charges $$ ACUTE PT VISIT: 1 Visit                     Leita Sable, PT, DPT Acute Rehabilitation Services Secure Chat Preferred Office: [phone]    Leita JONETTA Sable 01/17/2024, 1:33 PM

## 2024-01-17 NOTE — Plan of Care (Signed)
   Problem: Activity: Goal: Risk for activity intolerance will decrease Outcome: Progressing   Problem: Pain Managment: Goal: General experience of comfort will improve and/or be controlled Outcome: Progressing   Problem: Safety: Goal: Ability to remain free from injury will improve Outcome: Progressing

## 2024-01-17 NOTE — Progress Notes (Addendum)
 Patient transferred to CIR. All patient's belongings are with him including cell phone, charger and upper and lower dentures. Pt's grandson accompanied Elnita Chris, NT with transport to CIR.

## 2024-01-17 NOTE — Progress Notes (Signed)
 Inpatient Rehab Admissions Coordinator:   Awaiting determination from [facility] on CIR prior auth request begun yesterday.  I will follow.   Reche Lowers, PT, DPT Admissions Coordinator [phone] 01/17/24  9:31 AM

## 2024-01-17 NOTE — H&P (Signed)
 Physical Medicine and Rehabilitation Admission H&P    CC: Functional deficits due to debility     HPI: Don Howard is a 89 year old male with PMHx significant of hypertension, coronary artery disease, and bradycardia who initially presented to [facility] on 01/11/2024 with aphasia and syncope.  The patient called EMS himself, and upon their arrival, he was speaking with EMS but reported feeling unwell and was talking abnormally. He was noted to be bradycardic, and subsequently, he lost pulses, requiring two rounds of CPR without medications. Code stroke was activated and neurology consulted. A CT head and CTA showed no emergent findings. Labs revealed leukocytosis, lactic acidosis, and a deranged liver profile. Cardiology consulted and patient was found to have a right bundle branch block with T-wave inversions. There was a question of possible rapid atrial fibrillation (AFib). While in the emergency department, he was hypotensive, requiring pressors and fluid resuscitation and PCCM admitted for cardiac arrest with syncope with high probability of massive PE, shock, and AKI.  A STAT CTA confirmed bilateral moderate to large pulmonary embolism (PE) burden at the sub-segmental level, with some fully occluding, others partially occluding, and evidence of right heart strain. The patient received a half-dose of TNK and was started on a Heparin  drip. Cerebrovascular accident (CVA) was ruled out, and he was transitioned to Eliquis . At baseline, the patient lived independently, was still driving, and mowing his yard.  Patient reports he is taking saw palmetto for BPH, although not having significant symptoms at this time.  Patient says he was told to discontinue this medication.  Reports history of right knee OA, was offered gel injections previously was not interested at that time.  He currently requires CGA sit to stand and min assist with ambulation. Therapy evaluations completed due to patient  decreased functional mobility was admitted for a comprehensive rehab program.    Review of Systems  Constitutional:  Positive for malaise/fatigue. Negative for chills and fever.  HENT:  Positive for hearing loss (chronic).   Eyes:  Negative for blurred vision.  Respiratory:  Positive for shortness of breath. Negative for cough.   Cardiovascular:  Negative for chest pain and palpitations.  Gastrointestinal:  Negative for abdominal pain, constipation, diarrhea, nausea and vomiting.  Genitourinary: Negative.   Musculoskeletal:  Positive for joint pain and myalgias.  Skin:  Negative for rash.  Neurological:  Positive for weakness. Negative for sensory change, speech change and headaches.  Psychiatric/Behavioral:  The patient does not have insomnia.    Past Medical History:  Diagnosis Date   Hypertension    Past Surgical History:  Procedure Laterality Date   APPENDECTOMY     TONSILLECTOMY     Family History  Problem Relation Age of Onset   Pancreatic cancer Mother    Hypertension Father    CAD Neg Hx    Diabetes Neg Hx    Social History:  reports that he has never smoked. He has never used smokeless tobacco. He reports that he does not drink alcohol and does not use drugs. Allergies: No Known Allergies Medications Prior to Admission  Medication Sig Dispense Refill   amLODipine  (NORVASC ) 10 MG tablet Take 10 mg by mouth daily.     ascorbic acid  (VITAMIN C ) 500 MG tablet Take 500 mg by mouth daily.     aspirin  81 MG EC tablet Take 81 mg by mouth daily. Swallow whole.     B Complex-C (SUPER B COMPLEX PO) Take 1 tablet by mouth  daily.     Saw Palmetto, Serenoa repens, ([employer] SAW PALMETTO PO) Take 1 tablet by mouth daily.     VITAMIN E PO Take 1 tablet by mouth daily.        Home: Home Living Family/patient expects to be discharged to:: Private residence Living Arrangements: Alone Available Help at Discharge: Family, Available PRN/intermittently Type of Home: House Home  Access: Stairs to enter [employer] of Steps: 2 Entrance Stairs-Rails: Right Home Layout: One level Bathroom Shower/Tub: Engineer, manufacturing systems: Standard Home Equipment: Grab bars - tub/shower, Medical laboratory scientific officer - single point, Agricultural consultant (2 wheels), Crutches   Functional History: Prior Function Prior Level of Function : Independent/Modified Independent Mobility Comments: has ADs but reports that he does not use them ADLs Comments: Ind with ADLs, light cooking  Functional Status:  Mobility: Bed Mobility Overal bed mobility: Needs Assistance Bed Mobility: Supine to Sit Supine to sit: Supervision General bed mobility comments: pt in bathroom on commode with NT present upon OT arrival Transfers Overall transfer level: Needs assistance Equipment used: Rolling walker (2 wheels) Transfers: Sit to/from Stand Sit to Stand: Mod assist, Min assist General transfer comment: mod A from commode, min A from chair seated at sink. pt ambulated min A with RW fomr commode to 3 in 1 at sink Ambulation/Gait Ambulation/Gait assistance: Editor, commissioning (Feet): 6 Feet Assistive device: 1 person hand held assist Gait Pattern/deviations: Step-to pattern, Decreased stride length, Decreased weight shift to right, Decreased stance time - right, Decreased step length - right, Trunk flexed, Narrow base of support General Gait Details: Pt with heavy reliance on UE's for support, reaching for bedside table and other furniture to brace himself on. Declining PT assist and RW, however due to extreme risk of falling, PT insisted on HHA and gait belt. Pt agreeable to using RW next session. Gait velocity: Decreased Gait velocity interpretation: <1.31 ft/sec, indicative of household ambulator    ADL: ADL Overall ADL's : Needs assistance/impaired Eating/Feeding: Independent Grooming: Wash/dry hands, Wash/dry face, Contact guard assist, Supervision/safety, Sitting Upper Body Bathing:  Supervision/ safety, Sitting Upper Body Bathing Details (indicate cue type and reason): assit with back Lower Body Bathing: Moderate assistance Upper Body Dressing : Supervision/safety, Sitting Lower Body Dressing: Moderate assistance Toilet Transfer: Moderate assistance, Minimal assistance, Rolling walker (2 wheels), Cueing for safety Toilet Transfer Details (indicate cue type and reason): sit - stand form toilet and back to toilet Toileting- Clothing Manipulation and Hygiene: Moderate assistance, Sit to/from stand Functional mobility during ADLs: Moderate assistance, Minimal assistance, Cueing for safety, Rolling walker (2 wheels) General ADL Comments: pt seated at sink for bathing, dressing and shaving activities  Cognition: Cognition Orientation Level: Oriented X4 Cognition Arousal: Alert Behavior During Therapy: WFL for tasks assessed/performed  Physical Exam: Blood pressure 112/63, pulse 67, temperature 97.7 F (36.5 C), temperature source Oral, resp. rate 17, height 5' 10 (1.778 m), weight 65.8 kg, SpO2 97%. Physical Exam  General: No apparent distress HEENT: Head is normocephalic, atraumatic, sclera anicteric, oral mucosa pink and moist, dentition decreased Neck: Supple without JVD or lymphadenopathy Heart: Reg rate and rhythm. No murmurs rubs or gallops Chest: CTA bilaterally without wheezes, rales, or rhonchi; no distress Abdomen: Soft, non-tender, non-distended, bowel sounds positive. Extremities: No clubbing, cyanosis, or edema.  Psych: Pt's affect is appropriate. Pt is cooperative Skin: Bruising noted, B/L UEs Neuro:    Mental Status: AAOx4, memory intact, normal insight and awareness Speech/Languate: No speech or language deficits noted, follows simple commands CRANIAL NERVES: II: PERRL.  Visual fields full III, IV, VI: EOM intact, no gaze preference or deviation V: normal sensation bilaterally VII: no asymmetry VIII: hard of hearing  IX, X: normal palatal  elevation XI: 5/5 head turn and 5/5 shoulder shrug bilaterally XII: Tongue midline   MOTOR: RUE: 5/5 Deltoid, 5/5 Biceps, 5/5 Triceps,5/5 Grip LUE: 5/5 Deltoid, 5/5 Biceps, 5/5 Triceps, 5/5 Grip RLE: HF 4-/5, KE 4/5, ADF 4+/5, APF 4+/5 LLE: HF 4/5, KE 4+/5, ADF 4+/5, APF 4+/5   SENSORY: Normal to touch all 4 extremities  Coordination: Normal finger to nose b/l  No abnormal tone noted  MSK: R knee pain with ROM, atrophy intrinsic muscles of b/l hands  No results found for this or any previous visit (from the past 48 hours). No results found.    Blood pressure 112/63, pulse 67, temperature 97.7 F (36.5 C), temperature source Oral, resp. rate 17, height 5' 10 (1.778 m), weight 65.8 kg, SpO2 97%.  Medical Problem List and Plan: 1. Functional deficits secondary to debility due to cardiogenic shock, transient PEA arrest due to large volume pulmonary embolism S/P tPA  -patient may shower  -ELOS/Goals: PT/OT mod I, 7 to 9 days  - Admit to CIR 2.  Antithrombotics: -DVT/anticoagulation:  Mechanical: Sequential compression devices, below knee Bilateral lower extremities Pharmaceutical: Eliquis , plan for lifelong anticoagulation  -antiplatelet therapy: n/a 3. Pain Management: Tylenol  as needed 4. Mood/Behavior/Sleep: LCSW to follow for evaluation and support when available.   -antipsychotic agents: n/a 5. Neuropsych/cognition: This patient is capable of making decisions on his own behalf. 6. Skin/Wound Care: Routine pressure relief measures 7. Fluids/Electrolytes/Nutrition: Monitor I's/O recheck labs in a.m.  - Heart healthy diet  8.  Acute hypoxic respiratory failure due to PE: Eliquis  10 mg BID until 01/19/2024 then Eliquis  5 mg BID. 9.  Transaminitis-improving felt to be likely due to hypotension continue to monitor 10.  AKI: Prerenal improved with hydration, recheck labs 11.  Hyponatremia: Mild NA <132<134  12.  RV strain r/t PE: Will need repeat echocardiogram as  outpatient 13.  R knee pain: voltaren  gel 14.  Anemia, mild: Monitor labs 15.  BPH?  Previously on saw palmetto, likely best to hold off on this due to Eliquis .  Denies having significant symptoms at this time.  Daphne CROME Leak, NP 01/17/2024   I have personally performed a face to face diagnostic evaluation of this patient and formulated the key components of the plan.  Additionally, I have personally reviewed laboratory data, imaging studies, as well as relevant notes and concur with the physician assistant's documentation above.  The patient's status has not changed from the original H&P.  Any changes in documentation from the acute care chart have been noted above.  Murray Collier, MD, [facility]

## 2024-01-17 NOTE — Progress Notes (Addendum)
 Mobility Specialist Progress Note:    01/17/24 0950  Mobility  Activity Pivoted/transferred from bed to chair  Level of Assistance Minimal assist, patient does 75% or more  Assistive Device Front wheel walker  Distance Ambulated (ft) 10 ft  Activity Response Tolerated well  Mobility Referral Yes  Mobility visit 1 Mobility  Mobility Specialist Start Time (ACUTE ONLY) [id number]  Mobility Specialist Stop Time (ACUTE ONLY) 0944  Mobility Specialist Time Calculation (min) (ACUTE ONLY) 15 min   Received pt in bed and agreeable to mobility. Required MinA STS, otherwise contact guard. C/o BLE soreness, otherwise tolerated well. Left pt in chair with alarm on. Personal belongings and call light within reach. All needs met.  Lavanda Pollack Mobility Specialist  Please contact via [employer] or  Rehab Office [phone]

## 2024-01-18 ENCOUNTER — Other Ambulatory Visit (HOSPITAL_COMMUNITY): Payer: Self-pay

## 2024-01-18 DIAGNOSIS — R5381 Other malaise: Secondary | ICD-10-CM | POA: Diagnosis not present

## 2024-01-18 LAB — COMPREHENSIVE METABOLIC PANEL WITH GFR
ALT: 48 U/L — ABNORMAL HIGH (ref 0–44)
AST: 45 U/L — ABNORMAL HIGH (ref 15–41)
Albumin: 2.5 g/dL — ABNORMAL LOW (ref 3.5–5.0)
Alkaline Phosphatase: 55 U/L (ref 38–126)
Anion gap: 8 (ref 5–15)
BUN: 13 mg/dL (ref 8–23)
CO2: 24 mmol/L (ref 22–32)
Calcium: 8.4 mg/dL — ABNORMAL LOW (ref 8.9–10.3)
Chloride: 102 mmol/L (ref 98–111)
Creatinine, Ser: 0.86 mg/dL (ref 0.61–1.24)
GFR, Estimated: >60 mL/min (ref >60)
Glucose, Bld: 97 mg/dL (ref 70–99)
Potassium: 4 mmol/L (ref 3.5–5.1)
Sodium: 134 mmol/L — ABNORMAL LOW (ref 135–145)
Total Bilirubin: 0.8 mg/dL (ref 0.0–1.2)
Total Protein: 5.5 g/dL — ABNORMAL LOW (ref 6.5–8.1)

## 2024-01-18 LAB — CBC WITH DIFFERENTIAL/PLATELET
Abs Immature Granulocytes: 0.04 K/uL (ref 0.00–0.07)
Basophils Absolute: 0 K/uL (ref 0.0–0.1)
Basophils Relative: 0 %
Eosinophils Absolute: 0.2 K/uL (ref 0.0–0.5)
Eosinophils Relative: 2 %
HCT: 35 % — ABNORMAL LOW (ref 39.0–52.0)
Hemoglobin: 11.9 g/dL — ABNORMAL LOW (ref 13.0–17.0)
Immature Granulocytes: 0 %
Lymphocytes Relative: 23 %
Lymphs Abs: 2.2 K/uL (ref 0.7–4.0)
MCH: 31.5 pg (ref 26.0–34.0)
MCHC: 34 g/dL (ref 30.0–36.0)
MCV: 92.6 fL (ref 80.0–100.0)
Monocytes Absolute: 1.1 K/uL — ABNORMAL HIGH (ref 0.1–1.0)
Monocytes Relative: 12 %
Neutro Abs: 5.8 K/uL (ref 1.7–7.7)
Neutrophils Relative %: 63 %
Platelets: 387 K/uL (ref 150–400)
RBC: 3.78 MIL/uL — ABNORMAL LOW (ref 4.22–5.81)
RDW: 12.2 % (ref 11.5–15.5)
WBC: 9.4 K/uL (ref 4.0–10.5)
nRBC: 0 % (ref 0.0–0.2)

## 2024-01-18 LAB — GLUCOSE, CAPILLARY: Glucose-Capillary: 140 mg/dL — ABNORMAL HIGH (ref 70–99)

## 2024-01-18 MED ORDER — MELATONIN 3 MG PO TABS
3.0000 mg | ORAL_TABLET | Freq: Every evening | ORAL | Status: DC | PRN
  Administered 2024-01-18 – 2024-01-21 (×4): 3 mg via ORAL
  Filled 2024-01-18 (×4): qty 1

## 2024-01-18 NOTE — Evaluation (Signed)
 Physical Therapy Assessment and Plan  Patient Details  Name: Don Howard MRN: [id number] Date of Birth: [birth year]  PT Diagnosis: Abnormal posture, Abnormality of gait, Difficulty walking, Edema, Muscle weakness, Pain in joint, and Pain in knees Rehab Potential: Good ELOS: 12-14 days   Today's Date: 01/18/2024 PT Individual Time: 8953-8843 PT Individual Time Calculation (min): 70 min    Hospital Problem: Principal Problem:   Debility Active Problems:   Cardiac arrest (HCC)   Pulmonary embolus (HCC)   Past Medical History:  Past Medical History:  Diagnosis Date   Hypertension    Past Surgical History:  Past Surgical History:  Procedure Laterality Date   APPENDECTOMY     TONSILLECTOMY      Assessment & Plan Clinical Impression: Patient is a 89 y.o. year old male with PMHx significant of hypertension, coronary artery disease, and bradycardia who initially presented to [name] hospital on 01/11/2024 with aphasia and syncope. The patient called EMS himself, and upon their arrival, he was speaking with EMS but reported feeling unwell and was talking abnormally. He was noted to be bradycardic, and subsequently, he lost pulses, requiring two rounds of CPR without medications. Code stroke was activated and neurology consulted. A CT head and CTA showed no emergent findings. Labs revealed leukocytosis, lactic acidosis, and a deranged liver profile. Cardiology consulted and patient was found to have a right bundle branch block with T-wave inversions. There was a question of possible rapid atrial fibrillation (AFib). While in the emergency department, he was hypotensive, requiring pressors and fluid resuscitation and PCCM admitted for cardiac arrest with syncope with high probability of massive PE, shock, and AKI. A STAT CTA confirmed bilateral moderate to large pulmonary embolism (PE) burden at the sub-segmental level, with some fully occluding, others partially occluding, and evidence of  right heart strain. The patient received a half-dose of TNK and was started on a Heparin  drip. Cerebrovascular accident (CVA) was ruled out, and he was transitioned to Eliquis . At baseline, the patient lived independently, was still driving, and mowing his yard. Patient reports he is taking saw palmetto for BPH, although not having significant symptoms at this time. Patient says he was told to discontinue this medication. Reports history of right knee OA, was offered gel injections previously was not interested at that time. He currently requires CGA sit to stand and min assist with ambulation. Therapy evaluations completed due to patient decreased functional mobility was admitted for a comprehensive rehab program.   Patient currently requires min with mobility secondary to muscle weakness and muscle joint tightness, decreased cardiorespiratoy endurance, and decreased standing balance, decreased postural control, and decreased balance strategies.  Prior to hospitalization, patient was modified independent  with mobility and lived with Alone in a House home.  Home access is 2Stairs to enter.  Patient will benefit from skilled PT intervention to maximize safe functional mobility, minimize fall risk, and decrease caregiver burden for planned discharge home with intermittent assist.  Anticipate patient will benefit from follow up [facility] at discharge.  PT - End of Session Activity Tolerance: Tolerates 30+ min activity with multiple rests Endurance Deficit: Yes Endurance Deficit Description: required frequent and extended rest breaks with mobility PT Assessment Rehab Potential (ACUTE/IP ONLY): Good PT Barriers to Discharge: Decreased caregiver support;Home environment access/layout PT Barriers to Discharge Comments: knee pain, 2 STE with 1 handrail, older home with narrow doorways, lives alone PT Patient demonstrates impairments in the following area(s): Balance;Edema;Endurance;Nutrition;Pain;Safety;Skin  Integrity;Motor PT Transfers Functional Problem(s): Bed Mobility;Bed to Chair;Car;Furniture  PT Locomotion Functional Problem(s): Ambulation;Wheelchair Mobility;Stairs PT Plan PT Intensity: Minimum of 1-2 x/day ,45 to 90 minutes PT Frequency: 5 out of 7 days PT Duration Estimated Length of Stay: 12-14 days PT Treatment/Interventions: Ambulation/gait training;Discharge planning;Functional mobility training;Psychosocial support;Therapeutic Activities;Balance/vestibular training;Disease management/prevention;Neuromuscular re-education;Skin care/wound management;Therapeutic Exercise;Wheelchair propulsion/positioning;DME/adaptive equipment instruction;Pain management;Splinting/orthotics;UE/LE Strength taining/ROM;Community reintegration;Patient/family education;Stair training;UE/LE Coordination activities PT Transfers Anticipated Outcome(s): Mod I with LRAD PT Locomotion Anticipated Outcome(s): Mod I with LRAD PT Recommendation Recommendations for Other Services: Therapeutic Recreation consult Therapeutic Recreation Interventions: Pet therapy;Stress management Follow Up Recommendations: Home health PT Patient destination: Home Equipment Recommended: To be determined Equipment Details: has SPC, RW, and crutches   PT Evaluation Precautions/Restrictions Precautions Precautions: Fall Restrictions Weight Bearing Restrictions Per Provider Order: No Pain Interference Pain Interference Pain Effect on Sleep: 1. Rarely or not at all Pain Interference with Therapy Activities: 1. Rarely or not at all Pain Interference with Day-to-Day Activities: 1. Rarely or not at all Home Living/Prior Functioning Home Living Living Arrangements: Alone Available Help at Discharge: Family;Available PRN/intermittently (2 grandsons, 3 nephews) Type of Home: House Home Access: Stairs to enter [employer] of Steps: 2 Entrance Stairs-Rails: Right Home Layout: One level Bathroom Shower/Tub: Tub/shower unit  (grab bars) Bathroom Accessibility: Yes Additional Comments: pt reports having SPC, RW, and crutches at home. Only used SPC out in community; didn't use AD at home.  Lives With: Alone Prior Function Level of Independence: Independent with gait;Independent with basic ADLs;Independent with homemaking with ambulation;Independent with transfers  Able to Take Stairs?: Yes Driving: Yes Vision/Perception  Vision - History Ability to See in Adequate Light: 0 Adequate Perception Perception: Within Functional Limits Praxis Praxis: WFL  Cognition Overall Cognitive Status: Within Functional Limits for tasks assessed Arousal/Alertness: Awake/alert Orientation Level: Oriented X4 Attention: Sustained Sustained Attention: Appears intact Memory: Appears intact Awareness: Appears intact Problem Solving: Appears intact Safety/Judgment: Appears intact Sensation Sensation Light Touch: Appears Intact Hot/Cold: Not tested Proprioception: Appears Intact Stereognosis: Not tested Coordination Gross Motor Movements are Fluid and Coordinated: Yes Fine Motor Movements are Fluid and Coordinated: Not tested Coordination and Movement Description: global weakness, deconditioning, and intermittent knee pain Motor  Motor Motor: Within Functional Limits Motor - Skilled Clinical Observations: pain in bilateral knees and deconditioned  Trunk/Postural Assessment  Cervical Assessment Cervical Assessment: Exceptions to [facility] (forward head) Thoracic Assessment Thoracic Assessment: Exceptions to [facility] (kyphosis) Lumbar Assessment Lumbar Assessment: Exceptions to [facility] (posterior pelvic tilt) Postural Control Postural Control: Within Functional Limits  Balance Balance Balance Assessed: Yes Static Sitting Balance Static Sitting - Balance Support: No upper extremity supported;Feet supported Static Sitting - Level of Assistance: 6: Modified independent (Device/Increase time) Dynamic Sitting Balance Dynamic Sitting  - Balance Support: Feet supported;No upper extremity supported Dynamic Sitting - Level of Assistance: 5: Stand by assistance (supervision) Static Standing Balance Static Standing - Balance Support: During functional activity;No upper extremity supported Static Standing - Level of Assistance: 4: Min assist Dynamic Standing Balance Dynamic Standing - Balance Support: During functional activity;No upper extremity supported Dynamic Standing - Level of Assistance: 4: Min assist Dynamic Standing - Comments: with transfers Extremity Assessment  RLE Assessment RLE Assessment: Exceptions to [facility] General Strength Comments: tested sitting EOB RLE Strength Right Hip Flexion: 3+/5 Right Hip ABduction: 3+/5 Right Hip ADduction: 3+/5 Right Knee Flexion: 3+/5 Right Knee Extension: 3-/5 Right Ankle Dorsiflexion: 4-/5 Right Ankle Plantar Flexion: 4-/5 LLE Assessment LLE Assessment: Exceptions to [facility] General Strength Comments: tested sitting EOB LLE Strength Left Hip Flexion: 3+/5 Left Hip ABduction: 3+/5 Left Hip ADduction:  3+/5 Left Knee Flexion: 3+/5 Left Knee Extension: 3-/5 Left Ankle Dorsiflexion: 4-/5 Left Ankle Plantar Flexion: 4-/5  Care Tool Care Tool Bed Mobility Roll left and right activity   Roll left and right assist level: Supervision/Verbal cueing    Sit to lying activity   Sit to lying assist level: Supervision/Verbal cueing    Lying to sitting on side of bed activity   Lying to sitting on side of bed assist level: the ability to move from lying on the back to sitting on the side of the bed with no back support.: Supervision/Verbal cueing     Care Tool Transfers Sit to stand transfer   Sit to stand assist level: Minimal Assistance - Patient > 75%    Chair/bed transfer   Chair/bed transfer assist level: Minimal Assistance - Patient > 75%    Car transfer   Car transfer assist level: Minimal Assistance - Patient > 75%      Care Tool Locomotion Ambulation   Assist  level: 2 helpers Assistive device: Other (comment) (L handrail and R HHA) Max distance: 29ft  Walk 10 feet activity   Assist level: 2 helpers Assistive device: Other (comment) (L handrail and R HHA)   Walk 50 feet with 2 turns activity Walk 50 feet with 2 turns activity did not occur: Safety/medical concerns (fatigue, knee pain)      Walk 150 feet activity Walk 150 feet activity did not occur: Safety/medical concerns (fatigue, knee pain)      Walk 10 feet on uneven surfaces activity Walk 10 feet on uneven surfaces activity did not occur: Safety/medical concerns (fatigue, knee pain)      Stairs   Assist level: Minimal Assistance - Patient > 75% Stairs assistive device: 2 hand rails Max number of stairs: 4 (6in)  Walk up/down 1 step activity   Walk up/down 1 step (curb) assist level: Minimal Assistance - Patient > 75% Walk up/down 1 step or curb assistive device: 2 hand rails  Walk up/down 4 steps activity   Walk up/down 4 steps assist level: Minimal Assistance - Patient > 75% Walk up/down 4 steps assistive device: 2 hand rails  Walk up/down 12 steps activity Walk up/down 12 steps activity did not occur: Safety/medical concerns (fatigue, knee pain)      Pick up small objects from floor Pick up small object from the floor (from standing position) activity did not occur: Safety/medical concerns (fatigue, knee pain, decreased balance)      Wheelchair Is the patient using a wheelchair?: Yes Type of Wheelchair: Manual   Wheelchair assist level: Dependent - Patient 0% Max wheelchair distance: 173ft  Wheel 50 feet with 2 turns activity   Assist Level: Dependent - Patient 0%  Wheel 150 feet activity   Assist Level: Dependent - Patient 0%    Refer to Care Plan for Long Term Goals  SHORT TERM GOAL WEEK 1 PT Short Term Goal 1 (Week 1): pt will perform all transfers with LRAD and CGA PT Short Term Goal 2 (Week 1): pt will ambulate 22ft with LRAD and CGA PT Short Term Goal 3 (Week  1): pt will navigate 4 6in steps with 1 HR and CGA  Recommendations for other services: Therapeutic Recreation  Pet therapy and Stress management  Skilled Therapeutic Intervention Evaluation completed (see details above and below) with education on PT POC and goals and individual treatment initiated with focus on functional mobility/transfers, generalized strengthening and endurance, dynamic standing balance/coordination, simulated car transfers, stair navigation, and ambulation. Received  pt semi-reclined in bed, pt educated on PT evaluation, CIR policies, and therapy schedule and agreeable. Pt reported mild rib pain (unrated and declined pain medication). Pt transferred supine<>sitting L EOB from flat bed using bedrails and supervision with increased time and effort.   Stood from EOB without AD and min A and performed stand<>pivot into WC with min HHA. Pt required cues to scoot to edge of chair, for anterior weight shifting, and for hand placement when standing - specifically to push up from [facility] rather than pulling up. Pt transported to/from in [facility] dependently for time management purposes. Pt performed simulated car transfer without AD and heavy min A with cues for safety. Encouraged pt to sit, then get legs in rather than attempting to side step. Pt required increased effort and BUE assist to get legs in/out of car. Pt then stood in hallway with L handrail and min A and ambulated 21ft with L handrail and R HHA (min A overall) with +2 for WC follow - limited by fatigue and discomfort in knees.   Stood at staircase with min A and navigated 4 6in steps with bilateral handrails and min A ascending and descending with a step to pattern. Located 18x18 cushion for [facility] for comfort, then stood and performed alternating toe taps to 6in step 2x10 bilaterally with min A for balance. Returned to room and pt requested to return to bed. Stood with RW and CGA and ambulated 74ft with RW and CGA to bed. Transitioned into  supine with supervision and concluded session with pt semi-reclined in bed, needs within reach, and bed alarm on.   Mobility Bed Mobility Bed Mobility: Rolling Right;Rolling Left;Sit to Supine;Supine to Sit Rolling Right: Supervision/verbal cueing Rolling Left: Supervision/Verbal cueing Supine to Sit: Supervision/Verbal cueing Sit to Supine: Supervision/Verbal cueing Transfers Transfers: Sit to Stand;Stand to Sit;Stand Pivot Transfers Sit to Stand: Minimal Assistance - Patient > 75% Stand to Sit: Minimal Assistance - Patient > 75% Stand Pivot Transfers: Minimal Assistance - Patient > 75% Stand Pivot Transfer Details: Verbal cues for technique;Verbal cues for precautions/safety Stand Pivot Transfer Details (indicate cue type and reason): verbal cues to push up from surface rather than pull up to stand Transfer (Assistive device): None Locomotion  Gait Ambulation: Yes Gait Assistance: 2 Helpers Gait Distance (Feet): 25 Feet Assistive device: Other (Comment) (L handrail and R HHA) Gait Assistance Details: Verbal cues for sequencing;Verbal cues for technique;Verbal cues for precautions/safety;Verbal cues for gait pattern Gait Assistance Details: cues to push up from [place] when standing and for upright posture/gaze Gait Gait: Yes Gait Pattern: Impaired Gait Pattern: Step-to pattern;Decreased step length - right;Decreased step length - left;Decreased stride length;Poor foot clearance - left;Trunk flexed;Poor foot clearance - right;Antalgic;Narrow base of support Gait velocity: Decreased Stairs / Additional Locomotion Stairs: Yes Stairs Assistance: Minimal Assistance - Patient > 75% Stair Management Technique: Two rails;Step to pattern;Forwards Number of Stairs: 4 Height of Stairs: 6 Wheelchair Mobility Wheelchair Mobility: Yes Wheelchair Assistance: Dependent - Patient 0% Wheelchair Parts Management: Needs assistance Distance: 152ft   Discharge Criteria: Patient will be discharged  from PT if patient refuses treatment 3 consecutive times without medical reason, if treatment goals not met, if there is a change in medical status, if patient makes no progress towards goals or if patient is discharged from hospital.  The above assessment, treatment plan, treatment alternatives and goals were discussed and mutually agreed upon: by patient  Don Howard Don Howard Don PT, DPT 01/18/2024, 12:10 PM

## 2024-01-18 NOTE — Progress Notes (Addendum)
 Inpatient Rehabilitation Admission Medication Review by a Pharmacist  A complete drug regimen review was completed for this patient to identify any potential clinically significant medication issues.  High Risk Drug Classes Is patient taking? Indication by Medication  Antipsychotic Yes, as an intravenous medication Compazine  - nausea   Anticoagulant Yes Apixaban  - PE  Antibiotic No   Opioid No   Antiplatelet No   Hypoglycemics/insulin No   Vasoactive Medication Yes Amlodipine - HTN  Chemotherapy No   Other Yes Acetaminophen , diclofenac  topical gel - pain Melatonin - sleep     Type of Medication Issue Identified Description of Issue Recommendation(s)  Drug Interaction(s) (clinically significant)     Duplicate Therapy     Allergy     No Medication Administration End Date     Incorrect Dose     Additional Drug Therapy Needed     Significant med changes from prior encounter (inform family/care partners about these prior to discharge). PTA meds:   ASA 81mg , saw palmetto ( additive effect with  new blood thinning medication Apixaban , potentially increasing the risk of bleeding and bruising.  were discontinued.  Communicate to patient /family/ caregiver prior to discharge.   Other       Clinically significant medication issues were identified that warrant physician communication and completion of prescribed/recommended actions by midnight of the next day:   No  Name of provider notified for urgent issues identified:   Provider Method of Notification:    Pharmacist comments:   Time spent performing this drug regimen review (minutes):  15  Levorn Gaskins, [place] Clinical Pharmacist 01/18/2024 3:03 PM

## 2024-01-18 NOTE — Progress Notes (Signed)
 Inpatient Rehabilitation  Patient information reviewed and entered into eRehab system by Burnard Mealing, OTR/L, Rehab Quality Coordinator.   Information including medical coding, functional ability and quality indicators will be reviewed and updated through discharge.

## 2024-01-18 NOTE — Plan of Care (Signed)
  Problem: RH Balance Goal: LTG Patient will maintain dynamic standing with ADLs (OT) Description: LTG:  Patient will maintain dynamic standing balance with assist during activities of daily living (OT)  Flowsheets (Taken 01/18/2024 1010) LTG: Pt will maintain dynamic standing balance during ADLs with: Independent with assistive device   Problem: Sit to Stand Goal: LTG:  Patient will perform sit to stand in prep for activites of daily living with assistance level (OT) Description: LTG:  Patient will perform sit to stand in prep for activites of daily living with assistance level (OT) Flowsheets (Taken 01/18/2024 1010) LTG: PT will perform sit to stand in prep for activites of daily living with assistance level: Independent with assistive device   Problem: RH Bathing Goal: LTG Patient will bathe all body parts with assist levels (OT) Description: LTG: Patient will bathe all body parts with assist levels (OT) Flowsheets (Taken 01/18/2024 1010) LTG: Pt will perform bathing with assistance level/cueing: Independent with assistive device    Problem: RH Dressing Goal: LTG Patient will perform lower body dressing w/assist (OT) Description: LTG: Patient will perform lower body dressing with assist, with/without cues in positioning using equipment (OT) Flowsheets (Taken 01/18/2024 1010) LTG: Pt will perform lower body dressing with assistance level of: Independent with assistive device   Problem: RH Toileting Goal: LTG Patient will perform toileting task (3/3 steps) with assistance level (OT) Description: LTG: Patient will perform toileting task (3/3 steps) with assistance level (OT)  Flowsheets (Taken 01/18/2024 1010) LTG: Pt will perform toileting task (3/3 steps) with assistance level: Independent with assistive device   Problem: RH Simple Meal Prep Goal: LTG Patient will perform simple meal prep w/assist (OT) Description: LTG: Patient will perform simple meal prep with assistance, with/without  cues (OT). Flowsheets (Taken 01/18/2024 1010) LTG: Pt will perform simple meal prep with assistance level of: Supervision/Verbal cueing   Problem: RH Toilet Transfers Goal: LTG Patient will perform toilet transfers w/assist (OT) Description: LTG: Patient will perform toilet transfers with assist, with/without cues using equipment (OT) Flowsheets (Taken 01/18/2024 1010) LTG: Pt will perform toilet transfers with assistance level of: Independent with assistive device   Problem: RH Tub/Shower Transfers Goal: LTG Patient will perform tub/shower transfers w/assist (OT) Description: LTG: Patient will perform tub/shower transfers with assist, with/without cues using equipment (OT) Flowsheets (Taken 01/18/2024 1010) LTG: Pt will perform tub/shower stall transfers with assistance level of: Independent with assistive device

## 2024-01-18 NOTE — Progress Notes (Signed)
 PROGRESS NOTE   Subjective/Complaints: C/o right knee pain- patient is a good historian- describes that he received outpatient aspiration with cultures sent, uses a brace  ROS: +right knee pain   Objective:   No results found. Recent Labs    01/18/24 0504  WBC 9.4  HGB 11.9*  HCT 35.0*  PLT 387   Recent Labs    01/18/24 0504  NA 134*  K 4.0  CL 102  CO2 24  GLUCOSE 97  BUN 13  CREATININE 0.86  CALCIUM  8.4*    Intake/Output Summary (Last 24 hours) at 01/18/2024 1339 Last data filed at 01/18/2024 0800 Gross per 24 hour  Intake 836 ml  Output 1350 ml  Net -514 ml        Physical Exam: Vital Signs Blood pressure 102/72, pulse 83, temperature 98.3 F (36.8 C), resp. rate 18, height 5' 10 (1.778 m), weight 60.1 kg, SpO2 95%. Gen: no distress, normal appearing HEENT: oral mucosa pink and moist, NCAT Cardio: Reg rate Chest: normal effort, normal rate of breathing Abd: soft, non-distended Ext: no edema Psych: pleasant, normal affect Skin: intact Mental Status: AAOx4, memory intact, normal insight and awareness Speech/Languate: No speech or language deficits noted, follows simple commands CRANIAL NERVES: II: PERRL. Visual fields full III, IV, VI: EOM intact, no gaze preference or deviation V: normal sensation bilaterally VII: no asymmetry VIII: hard of hearing  IX, X: normal palatal elevation XI: 5/5 head turn and 5/5 shoulder shrug bilaterally XII: Tongue midline     MOTOR: RUE: 5/5 Deltoid, 5/5 Biceps, 5/5 Triceps,5/5 Grip LUE: 5/5 Deltoid, 5/5 Biceps, 5/5 Triceps, 5/5 Grip RLE: HF 4-/5, KE 4/5, ADF 4+/5, APF 4+/5 LLE: HF 4/5, KE 4+/5, ADF 4+/5, APF 4+/5     SENSORY: Normal to touch all 4 extremities   Coordination: Normal finger to nose b/l   No abnormal tone noted   MSK: R knee pain with ROM, atrophy intrinsic muscles of b/l hands  Assessment/Plan: 1. Functional deficits which  require 3+ hours per day of interdisciplinary therapy in a comprehensive inpatient rehab setting. Physiatrist is providing close team supervision and 24 hour management of active medical problems listed below. Physiatrist and rehab team continue to assess barriers to discharge/monitor patient progress toward functional and medical goals  Care Tool:  Bathing    Body parts bathed by patient: Right arm, Left arm, Abdomen, Chest, Front perineal area, Buttocks, Right upper leg, Left upper leg, Right lower leg, Left lower leg, Face         Bathing assist Assist Level: Minimal Assistance - Patient > 75%     Upper Body Dressing/Undressing Upper body dressing   What is the patient wearing?: Hospital gown only    Upper body assist Assist Level: Contact Guard/Touching assist    Lower Body Dressing/Undressing Lower body dressing      What is the patient wearing?: Pants     Lower body assist Assist for lower body dressing: Minimal Assistance - Patient > 75%     Toileting Toileting    Toileting assist Assist for toileting: Minimal Assistance - Patient > 75%     Transfers Chair/bed transfer  Transfers assist  Chair/bed transfer assist level: Minimal Assistance - Patient > 75%     Locomotion Ambulation   Ambulation assist      Assist level: 2 helpers Assistive device: Other (comment) (L handrail and R HHA) Max distance: 51ft   Walk 10 feet activity   Assist     Assist level: 2 helpers Assistive device: Other (comment) (L handrail and R HHA)   Walk 50 feet activity   Assist Walk 50 feet with 2 turns activity did not occur: Safety/medical concerns (fatigue, knee pain)         Walk 150 feet activity   Assist Walk 150 feet activity did not occur: Safety/medical concerns (fatigue, knee pain)         Walk 10 feet on uneven surface  activity   Assist Walk 10 feet on uneven surfaces activity did not occur: Safety/medical concerns (fatigue, knee  pain)         Wheelchair     Assist Is the patient using a wheelchair?: Yes Type of Wheelchair: Manual    Wheelchair assist level: Dependent - Patient 0% Max wheelchair distance: 157ft    Wheelchair 50 feet with 2 turns activity    Assist        Assist Level: Dependent - Patient 0%   Wheelchair 150 feet activity     Assist      Assist Level: Dependent - Patient 0%   Blood pressure 102/72, pulse 83, temperature 98.3 F (36.8 C), resp. rate 18, height 5' 10 (1.778 m), weight 60.1 kg, SpO2 95%.    Medical Problem List and Plan: 1. Functional deficits secondary to debility after large volume pulmonary embolism S/P TPA with cardiogenic shock and transient PEA arrest             -patient may shower             -ELOS/Goals: PT/OT mod I, 7 to 9 days             - Admit to CIR 2.  Antithrombotics: -DVT/anticoagulation:  Mechanical: Sequential compression devices, below knee Bilateral lower extremities Pharmaceutical: Eliquis , plan for lifelong anticoagulation             -antiplatelet therapy: n/a 3. Pain Management: Tylenol  as needed 4. Mood/Behavior/Sleep: LCSW to follow for evaluation and support when available.              -antipsychotic agents: n/a 5. Neuropsych/cognition: This patient is capable of making decisions on his own behalf. 6. Skin/Wound Care: Routine pressure relief measures 7. Fluids/Electrolytes/Nutrition: Monitor I's/O recheck labs in a.m.             - Heart healthy diet   8.  Acute hypoxic respiratory failure due to PE: Continue Eliquis  10 mg BID until 01/19/2024 then Eliquis  5 mg BID.  9.  Transaminitis-improving felt to be likely due to hypotension continue to monitor  10.  AKI: Cr reviewed and has normalized  11.  Hyponatremia: Mild NA <132<134   12.  RV strain r/t PE: Will need repeat echocardiogram as outpatient  13.  R knee pain: voltaren  gel, knee sleeve ordered  14.  Anemia: Hgb reviewed and is steadily improving  15.   BPH?  Previously on saw palmetto, likely best to hold off on this due to Eliquis .  Denies having significant symptoms at this time.  LOS: 1 days A FACE TO FACE EVALUATION WAS PERFORMED  Don Howard 01/18/2024, 1:39 PM

## 2024-01-18 NOTE — Evaluation (Signed)
 Occupational Therapy Assessment and Plan  Patient Details  Name: Don Howard MRN: [id number] Date of Birth: 1927/04/15  OT Diagnosis: acute pain and muscle weakness (generalized) Rehab Potential: Rehab Potential (ACUTE ONLY): Good ELOS: ~ 12 days   Today's Date: 01/18/2024 OT Individual Time: 9154-9054 OT Individual Time Calculation (min): 60 min     Hospital Problem: Principal Problem:   Debility Active Problems:   Cardiac arrest (HCC)   Pulmonary embolus (HCC)   Past Medical History:  Past Medical History:  Diagnosis Date   Hypertension    Past Surgical History:  Past Surgical History:  Procedure Laterality Date   APPENDECTOMY     TONSILLECTOMY      Assessment & Plan Clinical Impression: Patient is a 89 y.o. year old male wPMHx significant of hypertension, coronary artery disease, and bradycardia who initially presented to [facility] hospital on 01/11/2024 with aphasia and syncope. The patient called EMS himself, and upon their arrival, he was speaking with EMS but reported feeling unwell and was talking abnormally. He was noted to be bradycardic, and subsequently, he lost pulses, requiring two rounds of CPR without medications. Code stroke was activated and neurology consulted. A CT head and CTA showed no emergent findings. Labs revealed leukocytosis, lactic acidosis, and a deranged liver profile. Cardiology consulted and patient was found to have a right bundle branch block with T-wave inversions. There was a question of possible rapid atrial fibrillation (AFib). While in the emergency department, he was hypotensive, requiring pressors and fluid resuscitation and PCCM admitted for cardiac arrest with syncope with high probability of massive PE, shock, and AKI. A STAT CTA confirmed bilateral moderate to large pulmonary embolism (PE) burden at the sub-segmental level, with some fully occluding, others partially occluding, and evidence of right heart strain. The patient received a  half-dose of TNK and was started on a Heparin  drip. Cerebrovascular accident (CVA) was ruled out, and he was transitioned to Eliquis . At baseline, the patient lived independently, was still driving, and mowing his yard. Patient reports he is taking saw palmetto for BPH, although not having significant symptoms at this time. Patient says he was told to discontinue this medication. Reports history of right knee OA, was offered gel injections previously was not interested at that time. He currently requires CGA sit to stand and min assist with ambulation. Therapy evaluations completed due to patient decreased functional mobility was admitted for a comprehensive rehab program.   Patient transferred to CIR on 01/17/2024 .    Patient currently requires min with basic self-care skills and functional mobility secondary to muscle weakness, decreased cardiorespiratoy endurance, and decreased standing balance and decreased balance strategies.  Prior to hospitalization, patient could complete ADL with modified independent .  Patient will benefit from skilled intervention to decrease level of assist with basic self-care skills and increase independence with basic self-care skills prior to discharge home with care partner.  Anticipate patient will require intermittent supervision and follow up home health.  OT - End of Session Activity Tolerance: Tolerates 10 - 20 min activity with multiple rests Endurance Deficit: Yes OT Assessment Rehab Potential (ACUTE ONLY): Good OT Barriers to Discharge: None OT Patient demonstrates impairments in the following area(s): Balance;Pain;Edema;Endurance OT Basic ADL's Functional Problem(s): Bathing;Dressing;Toileting OT Advanced ADL's Functional Problem(s): None OT Transfers Functional Problem(s): Toilet;Tub/Shower OT Additional Impairment(s): None OT Plan OT Intensity: Minimum of 1-2 x/day, 45 to 90 minutes OT Frequency: 5 out of 7 days OT Duration/Estimated Length of Stay: ~  12 days OT  Treatment/Interventions: Balance/vestibular training;Discharge planning;Pain management;Self Care/advanced ADL retraining;Therapeutic Activities;UE/LE Coordination activities;Disease mangement/prevention;Functional mobility training;Patient/family education;Skin care/wound managment;Therapeutic Exercise;Community reintegration;DME/adaptive equipment instruction;Neuromuscular re-education;Psychosocial support;UE/LE Strength taining/ROM OT Self Feeding Anticipated Outcome(s): n/a OT Basic Self-Care Anticipated Outcome(s): mod I OT Toileting Anticipated Outcome(s): mod I OT Bathroom Transfers Anticipated Outcome(s): mod I OT Recommendation Recommendations for Other Services: None Patient destination: Home Follow Up Recommendations: Home health OT Equipment Recommended: To be determined   OT Evaluation Precautions/Restrictions  Precautions Precautions: Fall Restrictions Weight Bearing Restrictions Per Provider Order: No General Chart Reviewed: Yes Family/Caregiver Present: No Vital Signs   Pain Pain Assessment Pain Scale: 0-10 Pain Score: 0-No pain Home Living/Prior Functioning Home Living Family/patient expects to be discharged to:: Private residence Living Arrangements: Alone Available Help at Discharge: Family, Available PRN/intermittently Type of Home: House Home Access: Stairs to enter Secretary/administrator of Steps: 2 Entrance Stairs-Rails: Right Home Layout: One level  Lives With: Alone Vision Baseline Vision/History: 0 No visual deficits Ability to See in Adequate Light: 0 Adequate Patient Visual Report: No change from baseline Vision Assessment?: No apparent visual deficits Perception  Perception: Within Functional Limits Praxis Praxis: WFL Cognition Cognition Overall Cognitive Status: Within Functional Limits for tasks assessed Arousal/Alertness: Awake/alert Orientation Level: Person;Place;Situation Person: Oriented Place: Oriented Situation:  Oriented Memory: Appears intact Attention: Sustained Sustained Attention: Appears intact Awareness: Appears intact Problem Solving: Appears intact Safety/Judgment: Appears intact Brief Interview for Mental Status (BIMS) Repetition of Three Words (First Attempt): 3 Temporal Orientation: Year: Correct Temporal Orientation: Month: Accurate within 5 days Temporal Orientation: Day: Correct Recall: Sock: No, could not recall Recall: Blue: Yes, no cue required Recall: Bed: Yes, no cue required BIMS Summary Score: 13 Sensation Sensation Light Touch: Appears Intact Hot/Cold: Appears Intact Proprioception: Appears Intact Coordination Gross Motor Movements are Fluid and Coordinated: No Fine Motor Movements are Fluid and Coordinated: Yes Motor  Motor Motor: Within Functional Limits Motor - Skilled Clinical Observations: pain in bilateral knees and deconditioned  Trunk/Postural Assessment  Cervical Assessment Cervical Assessment: Within Functional Limits Thoracic Assessment Thoracic Assessment:  (forward flexe) Lumbar Assessment Lumbar Assessment: Within Functional Limits Postural Control Postural Control: Within Functional Limits  Balance Balance Balance Assessed: Yes Static Sitting Balance Static Sitting - Balance Support: No upper extremity supported Static Sitting - Level of Assistance: 6: Modified independent (Device/Increase time) Dynamic Sitting Balance Dynamic Sitting - Level of Assistance: 5: Stand by assistance Static Standing Balance Static Standing - Balance Support: During functional activity Static Standing - Level of Assistance: 4: Min assist Dynamic Standing Balance Dynamic Standing - Balance Support: During functional activity Dynamic Standing - Level of Assistance: 4: Min assist Extremity/Trunk Assessment RUE Assessment RUE Assessment: Within Functional Limits LUE Assessment LUE Assessment: Within Functional Limits  Care Tool Care Tool Self  Care Eating   Eating Assist Level: Independent with assistive device    Oral Care    Oral Care Assist Level: Set up assist    Bathing   Body parts bathed by patient: Right arm;Left arm;Abdomen;Chest;Front perineal area;Buttocks;Right upper leg;Left upper leg;Right lower leg;Left lower leg;Face     Assist Level: Minimal Assistance - Patient > 75%    Upper Body Dressing(including orthotics)   What is the patient wearing?: Hospital gown only   Assist Level: Contact Guard/Touching assist    Lower Body Dressing (excluding footwear)   What is the patient wearing?: Pants Assist for lower body dressing: Minimal Assistance - Patient > 75%    Putting on/Taking off footwear   What is the patient wearing?: Non-skid slipper  socks Assist for footwear: Set up assist       Care Tool Toileting Toileting activity   Assist for toileting: Minimal Assistance - Patient > 75%     Care Tool Bed Mobility Roll left and right activity        Sit to lying activity        Lying to sitting on side of bed activity   Lying to sitting on side of bed assist level: the ability to move from lying on the back to sitting on the side of the bed with no back support.: Contact Guard/Touching assist     Care Tool Transfers Sit to stand transfer   Sit to stand assist level: Contact Guard/Touching assist    Chair/bed transfer   Chair/bed transfer assist level: Minimal Assistance - Patient > 75%     Toilet transfer   Assist Level: Minimal Assistance - Patient > 75%     Care Tool Cognition  Expression of Ideas and Wants Expression of Ideas and Wants: 4. Without difficulty (complex and basic) - expresses complex messages without difficulty and with speech that is clear and easy to understand  Understanding Verbal and Non-Verbal Content Understanding Verbal and Non-Verbal Content: 4. Understands (complex and basic) - clear comprehension without cues or repetitions   Memory/Recall Ability     Refer to  Care Plan for Long Term Goals  SHORT TERM GOAL WEEK 1 OT Short Term Goal 1 (Week 1): Pt will perform toileting tasks mod I (sit to stand) OT Short Term Goal 2 (Week 1): Pt will obtain clothing from drawers/ closet with LRAD mod I OT Short Term Goal 3 (Week 1): Pt will perform simple meal prep in kitchen with supervision with LRAD in standing  Recommendations for other services: None    Skilled Therapeutic Intervention 1:1 Ot eval initiated with Ot goals, purpose and role discussed with pt. Pt received in the bed. Pt very sweet and a detailed historian. Pt with difficulties with cardiopulmonary endurance and bilateral knee discomfort and right knee swelling. Pt came to EOB with contact guard. From an elevated bed height pt able to come into standing with contact guard. Pt reports using a cane outside of the house. Pt ambulated to the bathroom with HHA with difficulty advancing his steps due to discomfort. Showered sit to stand with grab bar with min A. Pt ambulated back out to the w/c in the room with RW with improved balance and ability to step. Pt able to don LB clothing with min A with extra time. Pt left sitting up in w/c in prep for next session.    ADL ADL Upper Body Bathing: Setup Where Assessed-Upper Body Bathing: Shower Lower Body Bathing: Minimal assistance Where Assessed-Lower Body Bathing: Shower Upper Body Dressing: Minimal assistance Where Assessed-Upper Body Dressing: Sitting at sink Lower Body Dressing: Minimal assistance Where Assessed-Lower Body Dressing: Sitting at sink Toileting: Minimal assistance Where Assessed-Toileting: Teacher, adult education: Curator Method: Ship broker: Designer, multimedia: Insurance underwriter Method: Ambulating Mobility  Transfers Sit to Stand: Minimal Assistance - Patient > 75% Stand to Sit: Minimal Assistance - Patient >  75%   Discharge Criteria: Patient will be discharged from OT if patient refuses treatment 3 consecutive times without medical reason, if treatment goals not met, if there is a change in medical status, if patient makes no progress towards goals or if patient is discharged from hospital.  The above assessment, treatment plan,  treatment alternatives and goals were discussed and mutually agreed upon: by patient  Claudene Delon Levy 01/18/2024, 10:18 AM

## 2024-01-18 NOTE — Progress Notes (Signed)
 Physical Therapy Session Note  Patient Details  Name: Don Howard MRN: [id number] Date of Birth: [birth year]  Today's Date: 01/18/2024 PT Individual Time: 1500-1555 PT Individual Time Calculation (min): 55 min   Short Term Goals: Week 1:  PT Short Term Goal 1 (Week 1): pt will perform all transfers with LRAD and CGA PT Short Term Goal 2 (Week 1): pt will ambulate 59ft with LRAD and CGA PT Short Term Goal 3 (Week 1): pt will navigate 4 6in steps with 1 HR and CGA  Skilled Therapeutic Interventions/Progress Updates:   Pt in bed to start - in agreement to therapy treatment. NP in room - reports patient is waiting on R knee sleeve and pain medication - RN is aware. Offered to ace wrap his R knee until sleeve arrives - pt agreeable. Wrapped at bed level before assisting OOB.   Supine<>sitting EOB with minA for trunk support, as pt reaching for assistance. Able to scoot himself to EOB without assist. Requires elevated surface to stand with minA and no AD from EOB - transferred with minA to wheelchair.  Pt reports he loved and enjoys being outdoors - offered to go outside for session and patient in agreement. Transported outdoors near [facility]. Completed seated there-ex for BLE and BUE strengthening: -2x12 LAQ -2x12 hip marches -2x12 chest press with 3# bar -2x12 shoulder press with 3# bar -2x12 bicep curl with 3# bar  Pt returned upstairs to his room and he politely requested to return to bed to rest. MinA for stand pivot transfer to return to bed. minA needed for BLE management to assist him into supine position. Pt boosted to [facility] with minA with HOB in trendelenburg. Left with HOB raised, alarm on. All needs met.   Therapy Documentation Precautions:  Precautions Precautions: Fall Restrictions Weight Bearing Restrictions Per Provider Order: No   Therapy/Group: Individual Therapy  [name] 01/18/2024, 12:44 PM

## 2024-01-18 NOTE — Discharge Instructions (Signed)
 Inpatient Rehab Discharge Instructions  Don Howard Discharge date and time: 01/26/24   Activities/Precautions/ Functional Status: Activity: no lifting, driving, or strenuous exercise for until cleared by provider  Diet: cardiac diet Wound Care: none needed Functional status:  ___ No restrictions     ___ Walk up steps independently ___ 24/7 supervision/assistance   __x_ Walk up steps with assistance ___ Intermittent supervision/assistance  ___ Bathe/dress independently ___ Walk with walker     __x_ Bathe/dress with assistance ___ Walk Independently    ___ Shower independently _x__ Walk with assistance    __x_ Shower with assistance _X__ No alcohol     ___ Return to work/school ________  Special Instructions:    My questions have been answered and I understand these instructions. I will adhere to these goals and the provided educational materials after my discharge from the hospital.  Patient/Caregiver Signature _______________________________ Date __________  Clinician Signature _______________________________________ Date __________  Please bring this form and your medication list with you to all your follow-up doctor's appointments.   COMMUNITY REFERRALS UPON DISCHARGE:    Home Health:   PT    OT                       Agency: [facility] [phone]   GENERAL COMMUNITY RESOURCES FOR PATIENT/FAMILY:  Information on my medicine - ELIQUIS  (apixaban )  This medication education was reviewed with me or my healthcare representative as part of my discharge preparation.    Why was Eliquis  prescribed for you? Eliquis  was prescribed to treat blood clots that may have been found in the veins of your legs (deep vein thrombosis) or in your lungs (pulmonary embolism) and to reduce the risk of them occurring again.  What do You need to know about Eliquis  ? The starting dose is 10 mg (two 5 mg tablets) taken TWICE daily for the FIRST SEVEN (7) DAYS, then on January 20, 2024    the dose is reduced to ONE 5 mg tablet taken TWICE daily.  Eliquis  may be taken with or without food.   Try to take the dose about the same time in the morning and in the evening. If you have difficulty swallowing the tablet whole please discuss with your pharmacist how to take the medication safely.  Take Eliquis  exactly as prescribed and DO NOT stop taking Eliquis  without talking to the doctor who prescribed the medication.  Stopping may increase your risk of developing a new blood clot.  Refill your prescription before you run out.  After discharge, you should have regular check-up appointments with your healthcare provider that is prescribing your Eliquis .    What do you do if you miss a dose? If a dose of ELIQUIS  is not taken at the scheduled time, take it as soon as possible on the same day and twice-daily administration should be resumed. The dose should not be doubled to make up for a missed dose.  Important Safety Information A possible side effect of Eliquis  is bleeding. You should call your healthcare provider right away if you experience any of the following: Bleeding from an injury or your nose that does not stop. Unusual colored urine (red or dark brown) or unusual colored stools (red or black). Unusual bruising for unknown reasons. A serious fall or if you hit your head (even if there is no bleeding).  Some medicines may interact with Eliquis  and might increase your risk of bleeding or clotting while on Eliquis . To help avoid this,  consult your healthcare provider or pharmacist prior to using any new prescription or non-prescription medications, including herbals, vitamins, non-steroidal anti-inflammatory drugs (NSAIDs) and supplements.  This website has more information on Eliquis  (apixaban ): [email or web address] .[email or web address] dena

## 2024-01-18 NOTE — Progress Notes (Signed)
 [facility] Individual Statement of Services  Patient Name:  Don Howard  Date:  01/18/2024  Welcome to the [facility]patient [facility].  Our goal is to provide you with an individualized program based on your diagnosis and situation, designed to meet your specific needs.  With this comprehensive rehabilitation program, you will be expected to participate in at least 3 hours of rehabilitation therapies Monday-Friday, with modified therapy programming on the weekends.  Your rehabilitation program will include the following services:  Physical Therapy (PT), Occupational Therapy (OT), Speech Therapy (ST), 24 hour per day rehabilitation nursing, Therapeutic Recreaction (TR), Care Coordinator, Rehabilitation Medicine, Nutrition Services, and Pharmacy Services  Weekly team conferences will be held on Wednesday to discuss your progress.  Your Inpatient Rehabilitation Care Coordinator will talk with you frequently to get your input and to update you on team discussions.  Team conferences with you and your family in attendance may also be held.  Expected length of stay: 12-14 days Overall anticipated outcome: Independent with assistive device   Depending on your progress and recovery, your program may change. Your Inpatient Rehabilitation Care Coordinator will coordinate services and will keep you informed of any changes. Your Inpatient Rehabilitation Care Coordinator's name and contact numbers are listed  below.  The following services may also be recommended but are not provided by the Inpatient Rehabilitation Center:  Driving Evaluations Home Health Rehabiltiation Services Outpatient Rehabilitation Services Vocational Rehabilitation   Arrangements will be made to provide these services after discharge if needed.  Arrangements include referral to agencies that provide these services.  Your insurance has been verified to be: BLUE CROSS BLUE SHIELD MEDICARE / BCBS MEDICARE   Your primary doctor is:  [employer]  Pertinent information will be shared with your doctor and your insurance company.  Inpatient Rehabilitation Care Coordinator:  Graeme Feliciana SILK [phone] or [phone]  Information discussed with and copy given to patient by: Waverly Gentry, 01/18/2024, 10:47 AM

## 2024-01-18 NOTE — Progress Notes (Signed)
 Inpatient Rehabilitation Care Coordinator Assessment and Plan Patient Details  Name: Don Howard Howard MRN: [id number] Date of Birth: Jul 17, 1926  Today's Date: 01/18/2024  Hospital Problems: Principal Problem:   Debility Active Problems:   Cardiac arrest [facility])   Pulmonary embolus (HCC)  Past Medical History:  Past Medical History:  Diagnosis Date   Hypertension    Past Surgical History:  Past Surgical History:  Procedure Laterality Date   APPENDECTOMY     TONSILLECTOMY     Social History:  reports that he has never smoked. He has never used smokeless tobacco. He reports that he does not drink alcohol and does not use drugs.  Family / Support Systems Marital Status: Widow/Widower How Long?: 63 years Patient Roles: Parent, Other (Comment) (Retiree) Spouse/Significant Other: Deceased Other Supports: Grandchildren Don Howard Howard [phone] and Don Howard Howard [phone] Anticipated Caregiver: Don Howard Howard and Don Howard intermittently Ability/Limitations of Caregiver: None Caregiver Availability: Intermittent Family Dynamics: Good support from family  Social History Preferred language: English Religion: Baptist Cultural Background: Independent retiree who was living alone and getting arond just fine. Education: 10th grade Health Literacy - How often do you need to have someone help you when you read instructions, pamphlets, or other written material from your doctor or pharmacy?: Never Writes: Yes Employment Status: Retired Age Retired: 78   Abuse/Neglect Abuse/Neglect Assessment Can Be Completed: Yes Physical Abuse: Denies Verbal Abuse: Denies Sexual Abuse: Denies Exploitation of patient/patient's resources: Denies Self-Neglect: Denies  Patient response to: Social Isolation - How often do you feel lonely or isolated from those around you?: Never  Emotional Status Pt's affect, behavior and adjustment status: Patient is adjusting well to his changes Recent Psychosocial  Issues: None Psychiatric History: None Substance Abuse History: None  Patient / Family Perceptions, Expectations & Goals Pt/Family understanding of illness & functional limitations: Patient and family understanding of illness and functional limitations Premorbid pt/family roles/activities: Patient was independent prior to hospitalization. Family was involved and checking in on him as needed. Anticipated changes in roles/activities/participation: No anticipated changes in roles/activities/participation from patient/family Pt/family expectations/goals: Patient has goals of returning to prior independence and going home  [employer]: None Premorbid Home Care/DME Agencies: Other (Comment) (Cane, walker, crutches) Transportation available at discharge: Yes Is the patient able to respond to transportation needs?: Yes In the past 12 months, has lack of transportation kept you from medical appointments or from getting medications?: No In the past 12 months, has lack of transportation kept you from meetings, work, or from getting things needed for daily living?: No  Discharge Planning Living Arrangements: Alone Support Systems: Other relatives Interior and spatial designer and Don Howard Howard) Type of Residence: Private residence Community education officer Resources: Medicare (BLUE CROSS BLUE SHIELD MEDICARE) Surveyor, quantity Resources:  (Retirement) Living Expenses: Own Money Management: Patient Does the patient have any problems obtaining your medications?: No Home Management: Patient manages all home needs Care Coordinator Anticipated Follow Up Needs: HH/OP DC Planning Additional Notes/Comments: Plans to discharge home alone with intermmintent supprot from grandsons and extended family Expected length of stay: 7-10 days  Clinical Impression CSW met with patient to introduce herself and complete the initial assessment. Don Howard Howard is a 89 year old male with PMHx significant of hypertension, coronary artery  disease, and bradycardia who initially presented to [facility] on 01/11/[facility]24 with aphasia and syncope. [name] is AxOx4 and able to make all needs known. Don Howard Howard lives alone in a home with DME such as a cane, rolling walker, grab bars and crutches from the past. He  is determined to become independent as he previously was and is strong-willed. Mr. Counsell will have intermittent support from his 2 grandsons, Don Howard Howard (POA) and Don Howard as well as extended family. One of Mallie's grandsons will provide transportation home upon discharge. There were no further needs or concerns at present.   Di'Asia  Loreli 01/18/2024, 10:38 AM

## 2024-01-18 NOTE — Progress Notes (Signed)
 Patient ID: Don Howard, male   DOB: Feb 16, 1927, 89 y.o.   MRN: [id number] Met with the patient and grandson to review current medical situation, rehab process, team conference and plan of care. Discussed secondary risk management for PE and HTN. Reviewed medication and HH dietary modification recommendations.  Patient appears to be very self sufficient; attending to needs, cooking meals, etc. PTA and relied on family to transport to appointments but he was able to keep up with the dates.  Continue to follow along to address educational needs to facilitate preparation for discharge. Don Howard

## 2024-01-18 NOTE — Progress Notes (Signed)
 Orthopedic Tech Progress Note Patient Details:  Don Howard 02/27/1927 [id number]  Knee sleeve placed at bedside as the pt was out working with PT.  Ortho Devices Type of Ortho Device: Knee Sleeve Ortho Device/Splint Location: For RLE Ortho Device/Splint Interventions: Ordered      [name] 01/18/2024, 4:07 PM

## 2024-01-18 NOTE — Progress Notes (Signed)
   01/18/24 1040  Spiritual Encounters  Type of Visit Initial  Care provided to: Patient  Reason for visit Advance directives  OnCall Visit No   Chaplain provided AD documentation and education to Patient. Patient thought he might have HPOA at home and had completed this documentation years ago with an attorney, identifying his older grandson as his HC agent. Pt's younger grandson is coming later to the hospital and Patient will discuss AD doc with him. No additional spiritual need at this time.  Chaplain Therisa Samuel

## 2024-01-19 DIAGNOSIS — R5381 Other malaise: Principal | ICD-10-CM

## 2024-01-19 DIAGNOSIS — I69398 Other sequelae of cerebral infarction: Secondary | ICD-10-CM

## 2024-01-19 LAB — GLUCOSE, CAPILLARY: Glucose-Capillary: 95 mg/dL (ref 70–99)

## 2024-01-19 NOTE — Progress Notes (Signed)
 Occupational Therapy Session Note  Patient Details  Name: [name] MRN: [id number] Date of Birth: July 30, 1926  Today's Date: 01/19/2024 OT Individual Time: 1410-1445 OT Individual Time Calculation (min): 35 min OT missed time: 10 min Missed time reason: nursing care    Short Term Goals: Week 1:  OT Short Term Goal 1 (Week 1): Pt will perform toileting tasks mod I (sit to stand) OT Short Term Goal 2 (Week 1): Pt will obtain clothing from drawers/ closet with LRAD mod I OT Short Term Goal 3 (Week 1): Pt will perform simple meal prep in kitchen with supervision with LRAD in standing  Skilled Therapeutic Interventions/Progress Updates:  Skilled OT intervention completed with focus on ADL retraining, BUE strengthening. Pt received in standing with nursing assisting with voiding. Pt missed 10 mins of OT intervention secondary to nursing care; OT will make up missed time as able.  No pain reported. Agreeable to session. Pt requested to shave. Needed assist to position w/c at sink, and for sequencing, but then able to complete facial grooming with supervision. Did nick chin but it stopped with cleansing and pressure.  Seated in w/c, pt completed the following BUE exercises to promote BUE strength/endurance needed for independence with functional transfers and BADLs: (With 3 lb dowel) -2x20 chest press -2x20 bicep curls  Pt remained seated in w/c, with chair alarm on/activated, and with all needs in reach at end of session.   Therapy Documentation Precautions:  Precautions Precautions: Fall Restrictions Weight Bearing Restrictions Per Provider Order: No    Therapy/Group: Individual Therapy  [name], MS, OTR/L  01/19/2024, 2:50 PM

## 2024-01-19 NOTE — Progress Notes (Signed)
 PROGRESS NOTE   Subjective/Complaints: No new complaints this morning Right knee sleeve has been helpful for his pain Ambulating in hallway with R/PT assist  ROS: +right knee pain- improved with knee sleeve   Objective:   No results found. Recent Labs    01/18/24 0504  WBC 9.4  HGB 11.9*  HCT 35.0*  PLT 387   Recent Labs    01/18/24 0504  NA 134*  K 4.0  CL 102  CO2 24  GLUCOSE 97  BUN 13  CREATININE 0.86  CALCIUM  8.4*    Intake/Output Summary (Last 24 hours) at 01/19/2024 1731 Last data filed at 01/19/2024 1304 Gross per 24 hour  Intake 680 ml  Output 1450 ml  Net -770 ml        Physical Exam: Vital Signs Blood pressure (!) 126/55, pulse 62, temperature 97.6 F (36.4 C), temperature source Oral, resp. rate 20, height 5' 10 (1.778 m), weight 60.1 kg, SpO2 97%. Gen: no distress, normal appearing HEENT: oral mucosa pink and moist, NCAT Cardio: Reg rate Chest: normal effort, normal rate of breathing Abd: soft, non-distended Ext: no edema Psych: pleasant, normal affect Skin: intact Mental Status: AAOx4, memory intact, normal insight and awareness Speech/Languate: No speech or language deficits noted, follows simple commands CRANIAL NERVES: II: PERRL. Visual fields full III, IV, VI: EOM intact, no gaze preference or deviation V: normal sensation bilaterally VII: no asymmetry VIII: hard of hearing  IX, X: normal palatal elevation XI: 5/5 head turn and 5/5 shoulder shrug bilaterally XII: Tongue midline     MOTOR: RUE: 5/5 Deltoid, 5/5 Biceps, 5/5 Triceps,5/5 Grip LUE: 5/5 Deltoid, 5/5 Biceps, 5/5 Triceps, 5/5 Grip RLE: HF 4-/5, KE 4/5, ADF 4+/5, APF 4+/5 LLE: HF 4/5, KE 4+/5, ADF 4+/5, APF 4+/5  stable 8/15   SENSORY: Normal to touch all 4 extremities   Coordination: Normal finger to nose b/l   No abnormal tone noted   MSK: R knee pain with ROM, atrophy intrinsic muscles of b/l  hands  Assessment/Plan: 1. Functional deficits which require 3+ hours per day of interdisciplinary therapy in a comprehensive inpatient rehab setting. Physiatrist is providing close team supervision and 24 hour management of active medical problems listed below. Physiatrist and rehab team continue to assess barriers to discharge/monitor patient progress toward functional and medical goals  Care Tool:  Bathing    Body parts bathed by patient: Right arm, Left arm, Abdomen, Chest, Front perineal area, Buttocks, Right upper leg, Left upper leg, Right lower leg, Left lower leg, Face         Bathing assist Assist Level: Minimal Assistance - Patient > 75%     Upper Body Dressing/Undressing Upper body dressing   What is the patient wearing?: Hospital gown only    Upper body assist Assist Level: Contact Guard/Touching assist    Lower Body Dressing/Undressing Lower body dressing      What is the patient wearing?: Pants     Lower body assist Assist for lower body dressing: Minimal Assistance - Patient > 75%     Toileting Toileting    Toileting assist Assist for toileting: Minimal Assistance - Patient > 75%  Transfers Chair/bed transfer  Transfers assist     Chair/bed transfer assist level: Contact Guard/Touching assist     Locomotion Ambulation   Ambulation assist      Assist level: Contact Guard/Touching assist Assistive device: Walker-rolling Max distance: 169ft   Walk 10 feet activity   Assist     Assist level: Contact Guard/Touching assist Assistive device: Walker-rolling   Walk 50 feet activity   Assist Walk 50 feet with 2 turns activity did not occur: Safety/medical concerns (fatigue, knee pain)  Assist level: Contact Guard/Touching assist Assistive device: Walker-rolling    Walk 150 feet activity   Assist Walk 150 feet activity did not occur: Safety/medical concerns (fatigue, knee pain)  Assist level: Contact Guard/Touching  assist Assistive device: Walker-rolling    Walk 10 feet on uneven surface  activity   Assist Walk 10 feet on uneven surfaces activity did not occur: Safety/medical concerns (fatigue, knee pain)         Wheelchair     Assist Is the patient using a wheelchair?: Yes Type of Wheelchair: Manual    Wheelchair assist level: Dependent - Patient 0% Max wheelchair distance: 149ft    Wheelchair 50 feet with 2 turns activity    Assist        Assist Level: Dependent - Patient 0%   Wheelchair 150 feet activity     Assist      Assist Level: Dependent - Patient 0%   Blood pressure (!) 126/55, pulse 62, temperature 97.6 F (36.4 C), temperature source Oral, resp. rate 20, height 5' 10 (1.778 m), weight 60.1 kg, SpO2 97%.    Medical Problem List and Plan: 1. Functional deficits secondary to debility after large volume pulmonary embolism S/P TPA with cardiogenic shock and transient PEA arrest             -patient may shower             -ELOS/Goals: PT/OT mod I, 7 to 9 days             - Continue CIR  2.  Antithrombotics: -DVT/anticoagulation:  Mechanical: Sequential compression devices, below knee Bilateral lower extremities Pharmaceutical: continue Eliquis , plan for lifelong anticoagulation             -antiplatelet therapy: n/a  3. Pain Management: continue Tylenol  as needed  4. Mood/Behavior/Sleep: LCSW to follow for evaluation and support when available.              -antipsychotic agents: n/a 5. Neuropsych/cognition: This patient is capable of making decisions on his own behalf. 6. Skin/Wound Care: Routine pressure relief measures 7. Hyperglycemia: well controlled, d/c CBG checks.  8.  Acute hypoxic respiratory failure due to PE: continue Eliquis  10 mg BID until 01/19/2024 then Eliquis  5 mg BID.  9.  Transaminitis-improving felt to be likely due to hypotension continue to monitor  10.  AKI: Cr reviewed and has normalized  11.  Hyponatremia: Mild NA  <132<134   12.  RV strain r/t PE: Will need repeat echocardiogram as outpatient  13.  R knee pain: voltaren  gel, knee sleeve ordered, continue these  14.  Anemia: Hgb reviewed and is steadily improving  15.  BPH?  Previously on saw palmetto, likely best to hold off on this due to Eliquis .  Denies having significant symptoms at this time.  LOS: 2 days A FACE TO FACE EVALUATION WAS PERFORMED  Sven SHAUNNA Elks 01/19/2024, 5:31 PM

## 2024-01-19 NOTE — Progress Notes (Signed)
 Physical Therapy Session Note  Patient Details  Name: Don Howard MRN: [id number] Date of Birth: July 01, 1926  Today's Date: 01/19/2024 PT Individual Time: 9199-9143 and 8698-8656 PT Individual Time Calculation (min): 56 min and 42 min  Short Term Goals: Week 1:  PT Short Term Goal 1 (Week 1): pt will perform all transfers with LRAD and CGA PT Short Term Goal 2 (Week 1): pt will ambulate 65ft with LRAD and CGA PT Short Term Goal 3 (Week 1): pt will navigate 4 6in steps with 1 HR and CGA  Skilled Therapeutic Interventions/Progress Updates:   Treatment Session 1 Received pt semi-reclined in bed with questions regarding healthcare power of attorney paperwork - CSW notified. Pt pleasantly hyperverbal and curious about hospital and rehab program. Pt agreeable to PT treatment and reported mild chest pain when sneezing/coughing. Session with emphasis on functional mobility/transfers, dressing, generalized strengthening and endurance, dynamic standing balance/coordination, and ambulation. Pt transferred semi-reclined<>sitting L EOB with HOB elevated and use of bedrails with supervision and increased time/effort.   Pt requested to remove brief (reports continence); required x 2 attempts to stand from low sitting EOB with CGA and cues for anterior weight shifting but with good carry over with cues. Of note, pt requires increased time with all mobility due to fatigue and deconditioning but remains motivated to do as much by himself as possible. Removed brief, then returned to sitting and donned underwear and jeans sitting EOB with supervision and increased time. Stood x 3 additional trials from low sitting EOB with RW and CGA to pull underwear/jeans over hips with CGA for balance. Donned belt in standing with min A with CGA/min A for balance, then donned t-shirt and button up shirt sitting EOB with min A, then stood to tuck into pants with SPC. Donned socks, shoes, and hat sitting in WC with supervision    Pt transported to dayroom in [facility] dependently for time management purposes. Pt performed remainder of transfers with RW and CGA throughout session. Pt ambulated 126ft with RW and CGA - limited by R knee pain. Retrieved knee sleeve and donned with min A but required increased time to re-tie shoe. Pt ambulated additional 170ft with RW and CGA and reported improvements in R knee pain with sleeve on. Pt left in dayroom for UE group session.  Treatment Session 2 Received pt semi-reclined in bed requesting to eat his ice cream. While pt ate ice cream, located hurrycane to trial as pt was using one prior to admission. Pt agreeable to PT treatment and denied any pain during session just fatigue from previous therapies and required increased time with mobility. Session with emphasis on functional mobility/transfers, generalized strengthening and endurance, dynamic standing balance/coordination, and ambulation. Pt transferred semi-reclined<>sitting L EOB with HOB elevated and use of bedrails with supervision. Donned shoes with supervision and required multiple attempts with increased difficulty and CGA to stand with hurrycane. Pt ambulated 30ft with hurrycane and CGA. Pt ambulating with antalgic gait pattern with R knee varus, generalized unsteadiness, and reaching out for external support with LUE - ultimately limited by pain.  Located new RW (unable to find current one), then ambulated additional 18ft with RW and CGA. Pt able to ambulate much further, with improved gait mechanics, with less pain when using RW vs hurrycane. Returned to room and concluded session with pt sitting in [facility] with all needs within reach awaiting upcoming OT session.   Therapy Documentation Precautions:  Precautions Precautions: Fall Restrictions Weight Bearing Restrictions Per Provider Order: No  Therapy/Group: Individual Therapy Therisa HERO Zaunegger Therisa Stains PT, DPT 01/19/2024, 6:23 AM

## 2024-01-19 NOTE — Discharge Summary (Incomplete)
 Physician Discharge Summary  Patient ID: Don Howard MRN: [id number] DOB/AGE: 88-Dec-1928 88 y.o.  Admit date: 01/17/2024 Discharge date: 01/26/2024  Discharge Diagnoses:  Principal Problem:   Debility Active Problems:   Bilateral hearing loss   Cardiac arrest [facility])   Pulmonary embolus (HCC)   Hypertension   BPH (benign prostatic hyperplasia) Sleep disturbance    Discharged Condition: stable  Significant Diagnostic Studies: DG CHEST PORT 1 VIEW Result Date: 01/13/2024 CLINICAL DATA:  Fever. EXAM: PORTABLE CHEST 1 VIEW COMPARISON:  Radiograph and CT 01/11/2024 FINDINGS: Low lung volumes persist. Increased opacity in the right infrahilar lung with small right pleural effusion. There is also a small left pleural effusion with ill-defined basilar opacity. Stable heart size and mediastinal contours. No pneumothorax. No pulmonary edema. IMPRESSION: 1. Bibasilar opacities, right greater than left are new from prior exam. This may represent pneumonia or aspiration. 2. Small pleural effusions. Electronically Signed   By: Andrea Gasman M.D.   On: 01/13/2024 11:10   CT HEAD WO CONTRAST ( ) Result Date: 01/12/2024 CLINICAL DATA:  Stroke, follow up EXAM: CT HEAD WITHOUT CONTRAST TECHNIQUE: Contiguous axial images were obtained from the base of the skull through the vertex without intravenous contrast. RADIATION DOSE REDUCTION: This exam was performed according to the departmental dose-optimization program which includes automated exposure control, adjustment of the mA and/or kV according to patient size and/or use of iterative reconstruction technique. COMPARISON:  CT head 01/11/2024 FINDINGS: Brain: Patchy and confluent areas of decreased attenuation are noted throughout the deep and periventricular white matter of the cerebral hemispheres bilaterally, compatible with chronic microvascular ischemic disease. No evidence of large-territorial acute infarction. No parenchymal hemorrhage. No mass lesion.  No extra-axial collection. No mass effect or midline shift. No hydrocephalus. Basilar cisterns are patent. Vascular: No hyperdense vessel. Skull: No acute fracture or focal lesion. Sinuses/Orbits: Paranasal sinuses and mastoid air cells are clear. Bilateral lens replacement. Otherwise the orbits are unremarkable. Other: None. IMPRESSION: No acute intracranial abnormality. Electronically Signed   By: Morgane  Naveau M.D.   On: 01/12/2024 21:08   VAS US  LOWER EXTREMITY VENOUS (DVT) Result Date: 01/12/2024  Lower Venous DVT Study Patient Name:  Don Howard Reust  Date of Exam:   01/12/2024 Medical Rec #: [id number]       Accession #:    [id number] Date of Birth: 1926-09-15        Patient Gender: M Patient Age:   89 years Exam Location:  [facility] Procedure:      VAS US  LOWER EXTREMITY VENOUS (DVT) Referring Phys: SAMMI GORE --------------------------------------------------------------------------------  Indications: Pulmonary embolism.  Comparison Study: No previous exams Performing Technologist: Jody Hill RVT, RDMS  Examination Guidelines: A complete evaluation includes B-mode imaging, spectral Doppler, color Doppler, and power Doppler as needed of all accessible portions of each vessel. Bilateral testing is considered an integral part of a complete examination. Limited examinations for reoccurring indications may be performed as noted. The reflux portion of the exam is performed with the patient in reverse Trendelenburg.  +---------+---------------+---------+-----------+----------+--------------+ RIGHT    CompressibilityPhasicitySpontaneityPropertiesThrombus Aging +---------+---------------+---------+-----------+----------+--------------+ CFV      Full           Yes      Yes                                 +---------+---------------+---------+-----------+----------+--------------+ SFJ      Full                                                         +---------+---------------+---------+-----------+----------+--------------+  FV Prox  Full           Yes      Yes                                 +---------+---------------+---------+-----------+----------+--------------+ FV Mid   Full           Yes      Yes                                 +---------+---------------+---------+-----------+----------+--------------+ FV DistalFull           Yes      Yes                                 +---------+---------------+---------+-----------+----------+--------------+ PFV      Full                                                        +---------+---------------+---------+-----------+----------+--------------+ POP      Full           Yes      Yes                               +---------+---------------+---------+-----------+----------+--------------+ PTV      Full                                                        +---------+---------------+---------+-----------+----------+--------------+ PERO     Full                                                        +---------+---------------+---------+-----------+----------+--------------+   limited visualization due to mobility limitations  +---------+---------------+---------+-----------+----------+--------------+ LEFT     CompressibilityPhasicitySpontaneityPropertiesThrombus Aging +---------+---------------+---------+-----------+----------+--------------+ CFV      Full           Yes      Yes                                 +---------+---------------+---------+-----------+----------+--------------+ SFJ      Full                                                        +---------+---------------+---------+-----------+----------+--------------+ FV Prox  Full           Yes      Yes                                 +---------+---------------+---------+-----------+----------+--------------+ FV Mid  Full           Yes      Yes                                  +---------+---------------+---------+-----------+----------+--------------+ FV DistalFull           Yes      Yes                                 +---------+---------------+---------+-----------+----------+--------------+ PFV      Full                                                        +---------+---------------+---------+-----------+----------+--------------+ POP      Full           Yes      Yes                                 +---------+---------------+---------+-----------+----------+--------------+ PTV      Full                                                        +---------+---------------+---------+-----------+----------+--------------+ PERO     Full                                                        +---------+---------------+---------+-----------+----------+--------------+     Summary: BILATERAL: - No evidence of deep vein thrombosis seen in the lower extremities, bilaterally. - RIGHT: - No cystic structure found in the popliteal fossa.  LEFT: - A cystic structure is found in the popliteal fossa 4.19 x 0.87 x 2.81 cm.  *See table(s) above for measurements and observations. Electronically signed by Norman Serve on 01/12/2024 at 4:41:28 PM.    Final    US  Abdomen Limited RUQ (LIVER/GB) Result Date: 01/11/2024 CLINICAL DATA:  Transaminitis EXAM: ULTRASOUND ABDOMEN LIMITED RIGHT UPPER QUADRANT COMPARISON:  CT 03/04/2021 FINDINGS: Gallbladder: Multiple shadowing gallstones. Wall thickness at 2.3 mm. Negative sonographic Murphy. Positive for pericholecystic fluid. Common bile duct: Diameter: 3.3 mm Liver: No focal hepatic abnormality. The liver appears slightly echogenic on some of the images. Portal vein is patent on color Doppler imaging with normal direction of blood flow towards the liver. Other: None. IMPRESSION: 1. Cholelithiasis with pericholecystic fluid but negative sonographic Murphy. If there is concern for acute cholecystitis, correlation with nuclear  medicine hepatobiliary imaging could be obtained. 2. Possible slight increased echogenicity of liver as may be seen with steatosis. Electronically Signed   By: Luke Bun M.D.   On: 01/11/2024 21:59   ECHOCARDIOGRAM COMPLETE Result Date: 01/11/2024    ECHOCARDIOGRAM REPORT   Patient Name:   ALEZANDER DIMAANO Hollen Date of Exam: 01/11/2024 Medical Rec #:  [id number]      Height:       70.0 in  Accession #:    [id number]     Weight:       138.9 lb Date of Birth:  01-08-1927       BSA:          1.788 m Patient Age:    89 years       BP:           124/88 mmHg Patient Gender: M              HR:           104 bpm. Exam Location:  Inpatient Procedure: 2D Echo, Color Doppler, Cardiac Doppler and Intracardiac            Opacification Agent (Both Spectral and Color Flow Doppler were            utilized during procedure). Indications:    Shock R57.9  History:        Patient has prior history of Echocardiogram examinations, most                 recent 10/13/2022. Risk Factors:Hypertension.  Sonographer:    Jayson Gaskins Referring Phys: [id number] ARTIST POUCH IMPRESSIONS  1. Left ventricular ejection fraction, by estimation, is 60 to 65%. The left ventricle has normal function. The left ventricle has no regional wall motion abnormalities. There is mild concentric left ventricular hypertrophy. Left ventricular diastolic function could not be evaluated.  2. Right ventricular systolic function is severely reduced. The right ventricular size is moderately enlarged. Tricuspid regurgitation signal is inadequate for assessing PA pressure.  3. The mitral valve is normal in structure. No evidence of mitral valve regurgitation. No evidence of mitral stenosis.  4. The aortic valve is calcified. There is moderate calcification of the aortic valve. There is moderate thickening of the aortic valve. Aortic valve regurgitation is mild. Aortic valve sclerosis/calcification is present, without any evidence of aortic stenosis. Aortic valve area, by VTI  measures 2.70 cm. Aortic valve mean gradient measures 2.0 mmHg. Aortic valve Vmax measures 0.93 m/s. FINDINGS  Left Ventricle: Left ventricular ejection fraction, by estimation, is 60 to 65%. The left ventricle has normal function. The left ventricle has no regional wall motion abnormalities. Definity  contrast agent was given IV to delineate the left ventricular  endocardial borders. The left ventricular internal cavity size was normal in size. There is mild concentric left ventricular hypertrophy. Left ventricular diastolic function could not be evaluated. Right Ventricle: The right ventricular size is moderately enlarged. No increase in right ventricular wall thickness. Right ventricular systolic function is severely reduced. Tricuspid regurgitation signal is inadequate for assessing PA pressure. Left Atrium: Left atrial size was normal in size. Right Atrium: Right atrial size was normal in size. Pericardium: There is no evidence of pericardial effusion. Mitral Valve: The mitral valve is normal in structure. No evidence of mitral valve regurgitation. No evidence of mitral valve stenosis. Tricuspid Valve: The tricuspid valve is normal in structure. Tricuspid valve regurgitation is mild . No evidence of tricuspid stenosis. Aortic Valve: The aortic valve is calcified. There is moderate calcification of the aortic valve. There is moderate thickening of the aortic valve. Aortic valve regurgitation is mild. Aortic valve sclerosis/calcification is present, without any evidence of aortic stenosis. Aortic valve mean gradient measures 2.0 mmHg. Aortic valve peak gradient measures 3.5 mmHg. Aortic valve area, by VTI measures 2.70 cm. Pulmonic Valve: The pulmonic valve was normal in structure. Pulmonic valve regurgitation is mild. No evidence of pulmonic stenosis. Aorta: The aortic root is  normal in size and structure. Venous: The inferior vena cava was not well visualized. IAS/Shunts: No atrial level shunt detected by  color flow Doppler.  LEFT VENTRICLE PLAX 2D LVIDd:         3.80 cm LVIDs:         2.90 cm LV PW:         1.20 cm LV IVS:        1.30 cm LVOT diam:     1.80 cm LV SV:         40 LV SV Index:   22 LVOT Area:     2.54 cm  RIGHT VENTRICLE RV Basal diam:  5.30 cm RV Mid diam:    4.50 cm RV S prime:     10.00 cm/s TAPSE (M-mode): 1.8 cm LEFT ATRIUM             Index LA Vol (A2C):   16.7 ml 9.34 ml/m LA Vol (A4C):   24.7 ml 13.82 ml/m LA Biplane Vol: 20.5 ml 11.47 ml/m  AORTIC VALVE AV Area (Vmax):    2.76 cm AV Area (Vmean):   2.09 cm AV Area (VTI):     2.70 cm AV Vmax:           93.20 cm/s AV Vmean:          70.600 cm/s AV VTI:            0.148 m AV Peak Grad:      3.5 mmHg AV Mean Grad:      2.0 mmHg LVOT Vmax:         101.00 cm/s LVOT Vmean:        58.000 cm/s LVOT VTI:          0.157 m LVOT/AV VTI ratio: 1.06  AORTA Ao Root diam: 3.00 cm  SHUNTS Systemic VTI:  0.16 m Systemic Diam: 1.80 cm Wilbert Bihari MD Electronically signed by Wilbert Bihari MD Signature Date/Time: 01/11/2024/5:52:59 PM    Final    CT Angio Chest Pulmonary Embolism (PE) W or WO Contrast Result Date: 01/11/2024 CLINICAL DATA:  Pulmonary embolism (PE) suspected, high prob. Cardiac arrest. EXAM: CT ANGIOGRAPHY CHEST WITH CONTRAST TECHNIQUE: Multidetector CT imaging of the chest was performed using the standard protocol during bolus administration of intravenous contrast. Multiplanar CT image reconstructions and MIPs were obtained to evaluate the vascular anatomy. RADIATION DOSE REDUCTION: This exam was performed according to the departmental dose-optimization program which includes automated exposure control, adjustment of the mA and/or kV according to patient size and/or use of iterative reconstruction technique. CONTRAST:  70mL OMNIPAQUE  IOHEXOL  350 MG/ML SOLN COMPARISON:  CT angiography chest from 08/30/2017. FINDINGS: Cardiovascular: There is moderate to large volume acute pulmonary embolism. There are nonocclusive filling defects in the left  upper lobe lobar pulmonary artery branch with extension into the segmental branches. There is occlusive thrombus in the lingular segment branch. There is nonocclusive thrombus in the left lung lower lobe lobar pulmonary artery branch with occlusive and nonocclusive filling defects in the left lobe lower lobe segmental and subsegmental branches. There are occlusive and nonocclusive filling defects in the right upper lobe, middle lobe and right lower lobe segmental and subsegmental pulmonary artery branches. There is associated right heart strain/failure. No lung infarction. There is dilation of the main pulmonary trunk measuring up to 3.2 cm, which is nonspecific but can be seen with pulmonary artery hypertension. Top-normal heart size. No pericardial effusion. No aortic aneurysm. There are coronary artery calcifications, in keeping  with coronary artery disease. There are also mild peripheral atherosclerotic vascular calcifications of thoracic aorta and its major branches. Mediastinum/Nodes: Visualized thyroid  gland appears grossly unremarkable. No solid / cystic mediastinal masses. The esophagus is nondistended precluding optimal assessment. No axillary, mediastinal or hilar lymphadenopathy by size criteria. Lungs/Pleura: The central tracheo-bronchial tree is patent. There are patchy areas of linear, plate-like atelectasis and/or scarring throughout bilateral lungs. No mass or consolidation. No pleural effusion or pneumothorax. No suspicious lung nodules. Upper Abdomen: Visualized upper abdominal viscera within normal limits. Musculoskeletal: Several foci of air noted in the bilateral brachiocephalic veins, likely related to peripheral IV access. The visualized soft tissues of the chest wall are grossly unremarkable. No suspicious osseous lesions. There are mild to moderate multilevel degenerative changes in the visualized spine. There is acute undisplaced fracture of the upper body of the sternum (series 8, image  98 and series 9, image 95). There also undisplaced fractures of the right fourth through seventh costal cartilages. There is also buckling of the right anteromedial sixth rib, for which underlying acute fracture cannot be excluded. Review of the MIP images confirms the above findings. IMPRESSION: 1. There is moderate to large volume acute pulmonary embolism with associated right heart strain/failure. No lung infarction. 2. Upper sternal body and multiple right-sided costal cartilage/rib fractures, as described above. 3. Multiple other nonacute observations, as described above. Aortic Atherosclerosis (ICD10-I70.0). Critical Value/emergent results were discussed in person, at the time of interpretation on 01/11/2024 at 3:52 pm to provider Dr. Meade. Electronically Signed   By: Ree Molt M.D.   On: 01/11/2024 16:07   DG Chest Portable 1 View Result Date: 01/11/2024 CLINICAL DATA:  CPR EXAM: PORTABLE CHEST 1 VIEW COMPARISON:  08/30/2017 FINDINGS: Heart and mediastinal contours within normal limits. Bibasilar opacities, favor atelectasis. No effusions. No acute bony abnormality. Aortic atherosclerosis. No visible rib fracture or pneumothorax. IMPRESSION: Bibasilar opacities, favor atelectasis. Electronically Signed   By: Franky Crease M.D.   On: 01/11/2024 13:25   CT ANGIO HEAD NECK W WO CM (CODE STROKE) Result Date: 01/11/2024 EXAM: CTA HEAD AND NECK WITHOUT AND WITH 01/11/2024 11:27:00 AM TECHNIQUE: CTA of the head and neck was performed without and with the administration of intravenous contrast. Multiplanar 2D and/or 3D reformatted images are provided for review. Automated exposure control, iterative reconstruction, and/or weight based adjustment of the mA/kV was utilized to reduce the radiation dose to as low as reasonably achievable. Stenosis of the internal carotid arteries measured using NASCET criteria. COMPARISON: None available CLINICAL HISTORY: Neuro deficit, acute, stroke suspected. Code stroke. Dr.  Arora; Left sided gaze, aphasia. FINDINGS: CTA NECK: AORTIC ARCH AND ARCH VESSELS: The aortic arch demonstrates mild calcific atheromatous disease. There is common origin of the brachiocephalic artery and left common carotid artery. No dissection or arterial injury. No significant stenosis of the brachiocephalic or subclavian arteries. CERVICAL CAROTID ARTERIES: There is mild calcific plaque present within the carotid bulbs bilaterally, with less than 20% stenosis bilaterally. The cervical segments of the internal carotid arteries are otherwise normal in caliber. No dissection or arterial injury. CERVICAL VERTEBRAL ARTERIES: The vertebral arteries are widely patent. No dissection, arterial injury, or significant stenosis. LUNGS AND MEDIASTINUM: Unremarkable. SOFT TISSUES: No acute abnormality. BONES: No acute abnormality. CTA HEAD: ANTERIOR CIRCULATION: No significant stenosis of the internal carotid arteries. No significant stenosis of the anterior cerebral arteries. No significant stenosis of the middle cerebral arteries. No aneurysm. There is mild calcific plaque within the carotid siphons, but no appreciable stenosis. POSTERIOR  CIRCULATION: No significant stenosis of the posterior cerebral arteries. No significant stenosis of the basilar artery. No significant stenosis of the vertebral arteries. No aneurysm. OTHER: No dural venous sinus thrombosis on this non-dedicated study. Please note the above findings were communicated to Dr. Voncile via the [facility] paging service at 11:35 am 01/11/2024. IMPRESSION: 1. No large vessel occlusion, hemodynamically significant stenosis, or aneurysm in the head or neck. 2. Mild calcific plaque within the carotid bulbs bilaterally, with less than 20% stenosis bilaterally. Electronically signed by: evalene coho 01/11/2024 11:41 AM EDT RP Workstation: [id number]   CT HEAD CODE STROKE WO CONTRAST Result Date: 01/11/2024 CLINICAL DATA:  Code stroke. EXAM: CT HEAD WITHOUT CONTRAST  TECHNIQUE: Contiguous axial images were obtained from the base of the skull through the vertex without intravenous contrast. RADIATION DOSE REDUCTION: This exam was performed according to the departmental dose-optimization program which includes automated exposure control, adjustment of the mA and/or kV according to patient size and/or use of iterative reconstruction technique. COMPARISON:  None Available. FINDINGS: Brain: There is extensive chronic low-attenuation in the white matter Vascular: No dense vessel Skull: Normal. Negative for fracture or focal lesion. Sinuses/Orbits: No acute finding. Other: No hemorrhage ASPECTS (Alberta Stroke Program Early CT Score) - Ganglionic level infarction (caudate, lentiform nuclei, internal capsule, insula, M1-M3 cortex): 7 - Supraganglionic infarction (M4-M6 cortex): 3 Total score (0-10 with 10 being normal): 10 IMPRESSION: 1. Extensive chronic white matter abnormalities. No dense vessel or hemorrhage. 2. ASPECTS is 10 Electronically Signed   By: Nancyann Burns M.D.   On: 01/11/2024 11:29    Labs:  Basic Metabolic Panel: Recent Labs  Lab 01/22/24 0614 01/25/24 0527  NA 137 138  K 4.5 4.1  CL 105 103  CO2 25 24  GLUCOSE 95 87  BUN 13 23  CREATININE 0.82 0.85  CALCIUM  8.6* 8.8*    CBC: Recent Labs  Lab 01/22/24 0614  WBC 7.2  NEUTROABS 3.8  HGB 11.7*  HCT 35.0*  MCV 93.6  PLT 447*    CBG: Recent Labs  Lab 01/19/24 1958 01/20/24 0403  GLUCAP 104* 93    Brief HPI:   ORELL HURTADO is a 89 y.o. male with PMHx significant of hypertension, coronary artery disease, and bradycardia who initially presented to [facility] hospital on 01/11/2024 with aphasia and syncope. The patient called EMS himself, and upon their arrival, he was speaking with EMS but reported feeling unwell and was talking abnormally. He was noted to be bradycardic, and subsequently, he lost pulses, requiring two rounds of CPR without medications. Code stroke was activated and  neurology consulted. A CT head and CTA showed no emergent findings. Labs revealed leukocytosis, lactic acidosis, and a deranged liver profile. Cardiology consulted and patient was found to have a right bundle branch block with T-wave inversions. There was a question of possible rapid atrial fibrillation (AFib). While in the emergency department, he was hypotensive, requiring pressors and fluid resuscitation and PCCM admitted for cardiac arrest with syncope with high probability of massive PE, shock, and AKI. A STAT CTA confirmed bilateral moderate to large pulmonary embolism (PE) burden at the sub-segmental level, with some fully occluding, others partially occluding, and evidence of right heart strain. The patient received a half-dose of TNK and was started on a Heparin  drip. Cerebrovascular accident (CVA) was ruled out, and he was transitioned to Eliquis . At baseline, the patient lived independently, was still driving, and mowing his yard. Patient reports he is taking saw palmetto for BPH, although  not having significant symptoms at this time. Patient says he was told to discontinue this medication. Reports history of right knee OA, was offered gel injections previously was not interested at that time. He currently requires CGA sit to stand and min assist with ambulation. Therapy evaluations completed due to patient decreased functional mobility was admitted for a comprehensive rehab program.    Hospital Course: PRESCOTT TRUEX was admitted to rehab 01/17/2024 for inpatient therapies to consist of PT, ST and OT at least three hours five days a week. Past admission physiatrist, therapy team and rehab RN have worked together to provide customized collaborative inpatient rehab. He was admitted for inpatient rehabilitation due to functional deficits secondary to debility following a large-volume pulmonary embolism. He had undergone thrombolysis with TPA and experienced cardiogenic shock, with a transient PEA arrest  prior to admission. He was started on Eliquis , which will be continued for life. Pain was managed using multimodal therapies including Tylenol  and lidocaine  patches as needed. Blood pressure is monitored three times daily. Due to a hypotensive episode, amlodipine  was discontinued, though his hypertension remains well controlled. Hyperglycemia was noted, necessitating outpatient follow-up, but his blood sugars were stable during the stay. Kidney function improved with a stable creatinine of 0.82 and BUN at 13. Sodium levels improved, with hyponatremia resolving to a sodium level of 137, and fluid intake was encouraged. Anemia was monitored, with hemoglobin steadily improving. The patient has a history of BPH but reported no significant symptoms; the use of saw palmetto was discontinued due to his Eliquis  regimen. He also has right knee osteoarthritis, for which a knee sleeve provided relief and comfort. A follow-up echocardiogram is planned for right ventricular strain related to the pulmonary embolism. For insomnia, melatonin was effective, and he will continue its use as needed for adequate rest.    Rehab course: During patient's stay in rehab weekly team conferences were held to monitor patient's progress, set goals and discuss barriers to discharge. At admission, patient required CGA sit to stand and min assist with ambulation. He has had improvement in activity tolerance, balance, postural control as well as ability to compensate for deficits. He has made significant progress. In occupational therapy, he met all long-term goals and is discharging at a modified independent level of function. He is ambulatory with a rollator and can bathe independently at shower level. Ongoing skilled occupational therapy services and physical therapy through home health are recommended. In physical therapy, he also met long-term goals, demonstrating improved activity tolerance, balance, postural control, increased strength,  and the ability to compensate for deficits. He is discharging at a modified independent ambulatory level with the use of a rollator. Family teaching completed upon discharge to home with grandson at bedside.       Disposition:  Discharge disposition: 01-Home or Self Care        Diet: Regular   Special Instructions:  -No smoking or alcohol or illicit drug use     Allergies as of 01/26/2024   No Known Allergies      Medication List     PAUSE taking these medications    amLODipine  5 MG tablet Wait to take this until your doctor or other care provider tells you to start again. Commonly known as: NORVASC  Take 1 tablet (5 mg total) by mouth daily.       STOP taking these medications    polyethylene glycol 17 g packet Commonly known as: MIRALAX  / GLYCOLAX  Replaced by: polyethylene glycol powder 17 GM/SCOOP  powder       TAKE these medications    acetaminophen  325 MG tablet Commonly known as: TYLENOL  Take 1-2 tablets (325-650 mg total) by mouth every 4 (four) hours as needed for mild pain (pain score 1-3).   diclofenac  Sodium 1 % Gel Commonly known as: VOLTAREN  Apply 4 g topically 4 (four) times daily.   Eliquis  5 MG Tabs tablet Generic drug: apixaban  Take 1 tablet (5 mg total) by mouth 2 (two) times daily. What changed: See the new instructions.   lidocaine  5 % Commonly known as: LIDODERM  Place 1 patch onto the skin daily. Remove & Discard patch within 12 hours or as directed by MD   Melatonin 300 MCG Tabs Take 5 tablets (1,500 mcg total) by mouth at bedtime as needed.   polyethylene glycol powder 17 GM/SCOOP powder Commonly known as: GLYCOLAX /MIRALAX  Dissolve 1 capful (17 g) in liquid as directed and take by mouth 2 (two) times daily. Replaces: polyethylene glycol 17 g packet   Stool Softener/Laxative 50-8.6 MG tablet Generic drug: senna-docusate Take 1 tablet by mouth 2 (two) times daily.   SUPER B COMPLEX PO Take 1 tablet by mouth daily.    vitamin C  1000 MG tablet Take 1 tablet (1,000 mg total) by mouth daily. What changed:  medication strength how much to take   VITAMIN E PO Take 1 tablet by mouth daily.         Signed: Lyell Clugston L Payslie Mccaig 01/19/2024, 12:24 PM

## 2024-01-19 NOTE — Progress Notes (Signed)
 Blood sugar was 95 per NT; results would not transfer in system.

## 2024-01-19 NOTE — Group Note (Signed)
 Patient Details Name: JANCE SIEK MRN: [id number] DOB: [birth year] Today's Date: 01/19/2024  Time Calculation: OT Group Time Calculation OT Group Start Time: 0905 OT Group Stop Time: 1005 OT Group Time Calculation (min): 60 min      Group Description: BUE Therex Group: Pt participated in group session with a focus on BUE strength and endurance to facilitate improved activity tolerance and strength for higher level BADLs and functional mobility tasks.   Individual level documentation: Pt first in engaged in seated warm up where pt completed various UE stretches to promote improved ROM.  Pt engaged in seated therapeutic activity game where pts were instructed to roll large dice to see what number they rolled, once a number was determined each number correlated to an UB exercise and a number of reps, exercises included bicep curls, tricep extensions, upright rows, flys, chest presses and punches. Repetitions ranged from 4-20. Pt chose to use 1 lb weights during session. Education provided during activity of various modifications for all exercises. Education provided on the importance of deep breathing as well as determining each pts activity tolerance.   All pts received BUE HEP with pts completing below BUE therex with level 2 theraband.  X10 shoulder flexion  X10 bicep curls X10 shoulder horizontal ABD X10 shoulder diagonal pulls X10 shoulder extension    Issued pt written HEP to increase carryover   Pts transported back to room by RT.   Pain: No pain reported during session other than reports of fatigue.    Ronal Gift [facility] 01/19/2024, 12:17 PM

## 2024-01-20 DIAGNOSIS — R5381 Other malaise: Secondary | ICD-10-CM | POA: Diagnosis not present

## 2024-01-20 LAB — GLUCOSE, CAPILLARY: Glucose-Capillary: 93 mg/dL (ref 70–99)

## 2024-01-20 NOTE — Progress Notes (Signed)
 PROGRESS NOTE   Subjective/Complaints: C/o muscle soreness from yesterday's therapy session, no new complaints today Pain is 0/10 at rest and 3/10 with therapy No issues overnight  ROS: +right knee pain- improved with knee sleeve, +post exercise muscle soreness   Objective:   No results found. Recent Labs    01/18/24 0504  WBC 9.4  HGB 11.9*  HCT 35.0*  PLT 387   Recent Labs    01/18/24 0504  NA 134*  K 4.0  CL 102  CO2 24  GLUCOSE 97  BUN 13  CREATININE 0.86  CALCIUM  8.4*    Intake/Output Summary (Last 24 hours) at 01/20/2024 1357 Last data filed at 01/20/2024 1345 Gross per 24 hour  Intake 360 ml  Output 750 ml  Net -390 ml        Physical Exam: Vital Signs Blood pressure 120/76, pulse 86, temperature 97.7 F (36.5 C), temperature source Oral, resp. rate 16, height 5' 10 (1.778 m), weight 60.1 kg, SpO2 100%. Gen: no distress, normal appearing HEENT: oral mucosa pink and moist, NCAT Cardio: Reg rate Chest: normal effort, normal rate of breathing Abd: soft, non-distended Ext: no edema Psych: pleasant, normal affect Skin: intact Mental Status: AAOx4, memory intact, normal insight and awareness Speech/Languate: No speech or language deficits noted, follows simple commands CRANIAL NERVES: II: PERRL. Visual fields full III, IV, VI: EOM intact, no gaze preference or deviation V: normal sensation bilaterally VII: no asymmetry VIII: hard of hearing  IX, X: normal palatal elevation XI: 5/5 head turn and 5/5 shoulder shrug bilaterally XII: Tongue midline     MOTOR: RUE: 5/5 Deltoid, 5/5 Biceps, 5/5 Triceps,5/5 Grip LUE: 5/5 Deltoid, 5/5 Biceps, 5/5 Triceps, 5/5 Grip RLE: HF 4-/5, KE 4/5, ADF 4+/5, APF 4+/5 LLE: HF 4/5, KE 4+/5, ADF 4+/5, APF 4+/5 Stable 8/16   SENSORY: Normal to touch all 4 extremities   Coordination: Normal finger to nose b/l   No abnormal tone noted   MSK: R knee pain  with ROM, atrophy intrinsic muscles of b/l hands  Assessment/Plan: 1. Functional deficits which require 3+ hours per day of interdisciplinary therapy in a comprehensive inpatient rehab setting. Physiatrist is providing close team supervision and 24 hour management of active medical problems listed below. Physiatrist and rehab team continue to assess barriers to discharge/monitor patient progress toward functional and medical goals  Care Tool:  Bathing    Body parts bathed by patient: Right arm, Left arm, Abdomen, Chest, Front perineal area, Buttocks, Right upper leg, Left upper leg, Right lower leg, Left lower leg, Face         Bathing assist Assist Level: Minimal Assistance - Patient > 75%     Upper Body Dressing/Undressing Upper body dressing   What is the patient wearing?: Hospital gown only    Upper body assist Assist Level: Contact Guard/Touching assist    Lower Body Dressing/Undressing Lower body dressing      What is the patient wearing?: Pants     Lower body assist Assist for lower body dressing: Minimal Assistance - Patient > 75%     Toileting Toileting    Toileting assist Assist for toileting: Minimal Assistance - Patient >  75%     Transfers Chair/bed transfer  Transfers assist     Chair/bed transfer assist level: Contact Guard/Touching assist     Locomotion Ambulation   Ambulation assist      Assist level: Contact Guard/Touching assist Assistive device: Walker-rolling Max distance: 150   Walk 10 feet activity   Assist     Assist level: Contact Guard/Touching assist Assistive device: Walker-rolling   Walk 50 feet activity   Assist Walk 50 feet with 2 turns activity did not occur: Safety/medical concerns (fatigue, knee pain)  Assist level: Contact Guard/Touching assist Assistive device: Walker-rolling    Walk 150 feet activity   Assist Walk 150 feet activity did not occur: Safety/medical concerns (fatigue, knee  pain)  Assist level: Contact Guard/Touching assist Assistive device: Walker-rolling    Walk 10 feet on uneven surface  activity   Assist Walk 10 feet on uneven surfaces activity did not occur: Safety/medical concerns (fatigue, knee pain)   Assist level: Contact Guard/Touching assist Assistive device: Walker-rolling   Wheelchair     Assist Is the patient using a wheelchair?: Yes Type of Wheelchair: Manual    Wheelchair assist level: Dependent - Patient 0% Max wheelchair distance: 164ft    Wheelchair 50 feet with 2 turns activity    Assist        Assist Level: Dependent - Patient 0%   Wheelchair 150 feet activity     Assist      Assist Level: Dependent - Patient 0%   Blood pressure 120/76, pulse 86, temperature 97.7 F (36.5 C), temperature source Oral, resp. rate 16, height 5' 10 (1.778 m), weight 60.1 kg, SpO2 100%.    Medical Problem List and Plan: 1. Functional deficits secondary to debility after large volume pulmonary embolism S/P TPA with cardiogenic shock and transient PEA arrest             -patient may shower             -ELOS/Goals: PT/OT mod I, 7 to 9 days             -Continue CIR  2.  Antithrombotics: -DVT/anticoagulation:  Mechanical: Sequential compression devices, below knee Bilateral lower extremities Pharmaceutical: continue Eliquis , plan for lifelong anticoagulation             -antiplatelet therapy: n/a  3. Pain Management: continue Tylenol  as needed  4. Mood/Behavior/Sleep: LCSW to follow for evaluation and support when available.              -antipsychotic agents: n/a  5. Neuropsych/cognition: This patient is capable of making decisions on his own behalf.  6. Skin/Wound Care: Routine pressure relief measures  7. Hyperglycemia: well controlled, d/c CBG checks.  8.  Acute hypoxic respiratory failure due to PE: continue Eliquis  5 mg BID.  9.  Transaminitis-improving felt to be likely due to hypotension continue to  monitor  10.  AKI: Cr reviewed and has normalized  11.  Hyponatremia: Mild NA <132<134   12.  RV strain r/t PE: Will need repeat echocardiogram as outpatient  13.  R knee pain: voltaren  gel, knee sleeve ordered, continue these  14.  Anemia: Hgb reviewed and is steadily improving  15.  BPH?  Previously on saw palmetto, likely best to hold off on this due to Eliquis .  Denies having significant symptoms at this time.  LOS: 3 days A FACE TO FACE EVALUATION WAS PERFORMED  Sven SQUIBB Tyronica Truxillo 01/20/2024, 1:57 PM

## 2024-01-20 NOTE — IPOC Note (Signed)
 Overall Plan of Care [facility]) Patient Details Name: Don Howard MRN: [id number] DOB: 1927-05-17  Admitting Diagnosis: Debility  Hospital Problems: Principal Problem:   Debility Active Problems:   Cardiac arrest [facility])   Pulmonary embolus (HCC)     Functional Problem List: Nursing Bladder, Bowel, Edema, Endurance, Nutrition, Pain, Safety  PT Balance, Edema, Endurance, Nutrition, Pain, Safety, Skin Integrity, Motor  OT Balance, Pain, Edema, Endurance  SLP    TR         Basic ADL's: OT Bathing, Dressing, Toileting     Advanced  ADL's: OT None     Transfers: PT Bed Mobility, Bed to Chair, Car, Occupational psychologist, Research scientist (life sciences): PT Ambulation, Psychologist, prison and probation services, Stairs     Additional Impairments: OT None  SLP        TR      Anticipated Outcomes Item Anticipated Outcome  Self Feeding n/a  Swallowing      Basic self-care  mod I  Toileting  mod I   Bathroom Transfers mod I  Bowel/Bladder  manage bowels with medications/manage bladder with toileting assistance  Transfers  Mod I with LRAD  Locomotion  Mod I with LRAD  Communication     Cognition     Pain  <4 w/ prns  Safety/Judgment  manage safey with mod I assistance   Therapy Plan: PT Intensity: Minimum of 1-2 x/day ,45 to 90 minutes PT Frequency: 5 out of 7 days PT Duration Estimated Length of Stay: 12-14 days OT Intensity: Minimum of 1-2 x/day, 45 to 90 minutes OT Frequency: 5 out of 7 days OT Duration/Estimated Length of Stay: ~ 12 days     Team Interventions: Nursing Interventions Patient/Family Education, Medication Management, Bladder Management, Bowel Management, Disease Management/Prevention, Discharge Planning  PT interventions Ambulation/gait training, Discharge planning, Functional mobility training, Psychosocial support, Therapeutic Activities, Balance/vestibular training, Disease management/prevention, Neuromuscular re-education, Skin care/wound management,  Therapeutic Exercise, Wheelchair propulsion/positioning, DME/adaptive equipment instruction, Pain management, Splinting/orthotics, UE/LE Strength taining/ROM, Firefighter, Equities trader education, Museum/gallery curator, UE/LE Coordination activities  OT Interventions Warden/ranger, Discharge planning, Pain management, Self Care/advanced ADL retraining, Therapeutic Activities, UE/LE Coordination activities, Disease mangement/prevention, Functional mobility training, Patient/family education, Skin care/wound managment, Therapeutic Exercise, Community reintegration, Fish farm manager, Neuromuscular re-education, Psychosocial support, UE/LE Strength taining/ROM  SLP Interventions    TR Interventions    SW/CM Interventions Discharge Planning, Patient/Family Education   Barriers to Discharge MD  Medical stability  Nursing Decreased caregiver support, Home environment access/layout Discharge: House  Discharge Home Layout: One level  Discharge Home Access: Stairs to enter  Entrance Stairs-Rails: Right  Entrance Stairs-Number of Steps: 2  PT Decreased caregiver support, Home environment access/layout knee pain, 2 STE with 1 handrail, older home with narrow doorways, lives alone  OT None    SLP      SW       Team Discharge Planning: Destination: PT-Home ,OT- Home , SLP-  Projected Follow-up: PT-Home health PT, OT-  Home health OT, SLP-  Projected Equipment Needs: PT-To be determined, OT- To be determined, SLP-  Equipment Details: PT-has SPC, RW, and crutches, OT-  Patient/family involved in discharge planning: PT- Patient,  OT-Patient, SLP-   MD ELOS: 7-9 days Medical Rehab Prognosis:  Excellent Assessment: The patient has been admitted for CIR therapies with the diagnosis of debility after large volume PE. The team will be addressing functional mobility, strength, stamina, balance, safety, adaptive techniques and equipment, self-care, bowel and bladder mgt,  patient and caregiver  education. Goals have been set at modI. Anticipated discharge destination is home.       See Team Conference Notes for weekly updates to the plan of care

## 2024-01-20 NOTE — Progress Notes (Signed)
 Occupational Therapy Session Note  Patient Details  Name: Don Howard MRN: [id number] Date of Birth: [birth year]  Today's Date: 01/20/2024 OT Individual Time: 0100-0200 OT Individual Time Calculation (min): 60 min    Short Term Goals: Week 1:  OT Short Term Goal 1 (Week 1): Pt will perform toileting tasks mod I (sit to stand) OT Short Term Goal 2 (Week 1): Pt will obtain clothing from drawers/ closet with LRAD mod I OT Short Term Goal 3 (Week 1): Pt will perform simple meal prep in kitchen with supervision with LRAD in standing  Skilled Therapeutic Interventions/Progress Updates:   The patient was in bed at the time of arrival with family present.  The patient was in agreement with completing sit to stands, UB exercise using theraband, and  the UB cycle.  The pt was able to come from supine in bed to EOB with close S. The pt was able to come from sit to stand 4x using the bed and the RW for additional balance.   The pt went on to complete UB exercise using the 1lb dowel for shld flexion, horizontal abduction, shld rotation, and lg circle 1 set of 15 with rest breaks as needed, the pt required 2 rest breaks. The pt went on to complete the UB cycle sitting EOB  for 10 minutes in duration with rest breaks as needed, the pt required 1 rest breaks. The pt was able to remove his socks  EOB with closeS.  The pt's family was instructed in the use of the rollator and the 3 n 1 for night use for additional safety. The pt was able to transition from EOB to supine in bed with close S. The call light and bedside table were placed within reach with all additional needs addressed prior to exiting the room.    Therapy Documentation Precautions:  Precautions Precautions: Fall Restrictions Weight Bearing Restrictions Per Provider Order: No Therapy/Group: Individual Therapy  Elvera JONETTA Mace 01/20/2024, 4:50 PM

## 2024-01-20 NOTE — Progress Notes (Signed)
 Physical Therapy Session Note  Patient Details  Name: Don Howard MRN: [id number] Date of Birth: Jan 09, 1927  Today's Date: 01/20/2024 PT Individual Time: 0725-0837 PT Individual Time Calculation (min): 72 min   Short Term Goals: Week 1:  PT Short Term Goal 1 (Week 1): pt will perform all transfers with LRAD and CGA PT Short Term Goal 2 (Week 1): pt will ambulate 59ft with LRAD and CGA PT Short Term Goal 3 (Week 1): pt will navigate 4 6in steps with 1 HR and CGA  Skilled Therapeutic Interventions/Progress Updates:   Received pt semi-reclined in bed. Pt surprised to see therapist, stating I thought I had the day off - did not receive schedule and also did not get breakfast. Pt agreeable to PT treatment and denied any pain but reported soreness in BUE from group session yesterday. Pt also reported not getting much sleep last night with little energy for therapy today. Located breakfast tray and encouraged OOB mobility to eat - with encouragement, pt agreed.  Introduced pt to rollator and educated on Geographical information systems officer and importance of backing rollator against wall prior to sitting. Pt transferred semi-reclined<>sitting L EOB with HOB elevated and use of bedrails with supervision. Donned shoes with max A, then pt stood with rollator and CGA and transferred into WC with rollator and CGA. Set pt up to eat breakfast while therapist wrote down pt's therapy schedule for today - pt ate 100% of breakfast.  Applied arthritis gel and donned R knee sleeve. Pt donned jeans sitting in WC with supervision and shoes with max A for time purposes. Stood from [facility] with rollator and CGA, donned shirt with min A, and tucked shirt into jeans with CGA for balance. RN arrived to administer medications and provided pt with heat packs for low back to relieve muscle soreness. Pt ambulated 151ft x 2 trials with rollator and CGA to/from dayroom with 1 extended seated rest break - min cues for rollator  brake safety. Returned to room and requested to lie down. Transferred into supine with supervision and concluded session with pt semi-reclined in bed, needs within reach, and bed alarm on.   Therapy Documentation Precautions:  Precautions Precautions: Fall Restrictions Weight Bearing Restrictions Per Provider Order: No  Therapy/Group: Individual Therapy Therisa HERO Zaunegger Therisa Stains PT, DPT 01/20/2024, 6:55 AM

## 2024-01-20 NOTE — Progress Notes (Signed)
 Physical Therapy Session Note  Patient Details  Name: Don Howard MRN: [id number] Date of Birth: Jul 11, 1926  Today's Date: 01/20/2024 PT Individual Time: 1002-1115 PT Individual Time Calculation (min): 73 min   Short Term Goals: Week 1:  PT Short Term Goal 1 (Week 1): pt will perform all transfers with LRAD and CGA PT Short Term Goal 2 (Week 1): pt will ambulate 32ft with LRAD and CGA PT Short Term Goal 3 (Week 1): pt will navigate 4 6in steps with 1 HR and CGA  Skilled Therapeutic Interventions/Progress Updates: Pt presents supine in bed and agreeable to therapy.  Pt transfers sup to sit w/ supervision.  Pt dons shoes in sitting w/ supervision.  Pt transfers sit to stand w/ CGA but cues for scooting forward.  Pt amb to w/c w/ RW and CGA.  Pt wheeled to outdoors at [facility].  Pt amb on uneven surfaces w/ RW and CGA to breezeway.  Pt performed seated LE there ex 3 x 10-15 calf raises, LAQ, hip flexion and abd/add.  Pt amb to w/c x 150' w/ pt stating increased pain to R knee.  Pt returned to hallway and amb to bed w/ CGA.  Pt dons shoes and then supervision for sit to supine .  Bed alarm on and all needs in reach.     Therapy Documentation Precautions:  Precautions Precautions: Fall Restrictions Weight Bearing Restrictions Per Provider Order: No General:   Vital Signs:   Pain:0/10 initially, increased to 3/10 w/ activity R knee. Pain Assessment Pain Scale: 0-10 Pain Score: 5  Pain Type: Acute pain Pain Location: Arm Pain Orientation: Right;Left Pain Radiating Towards: back Pain Descriptors / Indicators: Sore Pain Frequency: Intermittent Pain Onset: With Activity Patients Stated Pain Goal: 3 Pain Intervention(s): Medication (See eMAR)     Therapy/Group: Individual Therapy  Gatlin Kittell P Patrice Matthew 01/20/2024, 11:16 AM

## 2024-01-21 DIAGNOSIS — R5381 Other malaise: Secondary | ICD-10-CM | POA: Diagnosis not present

## 2024-01-21 MED ORDER — AMLODIPINE BESYLATE 2.5 MG PO TABS
2.5000 mg | ORAL_TABLET | Freq: Every day | ORAL | Status: DC
  Administered 2024-01-22 – 2024-01-23 (×2): 2.5 mg via ORAL
  Filled 2024-01-21 (×2): qty 1

## 2024-01-21 NOTE — Progress Notes (Signed)
 PROGRESS NOTE   Subjective/Complaints: No complaints this morning No therapy today Asks when he may be able to go home- will send staff message to team to discuss  ROS: +right knee pain- improved with knee sleeve, +post exercise muscle soreness- improved   Objective:   No results found. No results for input(s): WBC, HGB, HCT, PLT in the last 72 hours.  No results for input(s): NA, K, CL, CO2, GLUCOSE, BUN, CREATININE, CALCIUM  in the last 72 hours.   Intake/Output Summary (Last 24 hours) at 01/21/2024 1229 Last data filed at 01/21/2024 1010 Gross per 24 hour  Intake 1440 ml  Output 1500 ml  Net -60 ml        Physical Exam: Vital Signs Blood pressure 128/70, pulse 61, temperature 98 F (36.7 C), temperature source Oral, resp. rate 18, height 5' 10 (1.778 m), weight 60.1 kg, SpO2 96%. Gen: no distress, normal appearing HEENT: oral mucosa pink and moist, NCAT Cardio: Reg rate Chest: normal effort, normal rate of breathing Abd: soft, non-distended Ext: no edema Psych: pleasant, normal affect Skin: intact Mental Status: AAOx4, memory intact, normal insight and awareness Speech/Languate: No speech or language deficits noted, follows simple commands CRANIAL NERVES: II: PERRL. Visual fields full III, IV, VI: EOM intact, no gaze preference or deviation V: normal sensation bilaterally VII: no asymmetry VIII: hard of hearing  IX, X: normal palatal elevation XI: 5/5 head turn and 5/5 shoulder shrug bilaterally XII: Tongue midline     MOTOR: RUE: 5/5 Deltoid, 5/5 Biceps, 5/5 Triceps,5/5 Grip LUE: 5/5 Deltoid, 5/5 Biceps, 5/5 Triceps, 5/5 Grip RLE: HF 4-/5, KE 4/5, ADF 4+/5, APF 4+/5 LLE: HF 4/5, KE 4+/5, ADF 4+/5, APF 4+/5 Stable 8/17   SENSORY: Normal to touch all 4 extremities   Coordination: Normal finger to nose b/l   No abnormal tone noted   MSK: R knee pain with ROM, atrophy  intrinsic muscles of b/l hands  Assessment/Plan: 1. Functional deficits which require 3+ hours per day of interdisciplinary therapy in a comprehensive inpatient rehab setting. Physiatrist is providing close team supervision and 24 hour management of active medical problems listed below. Physiatrist and rehab team continue to assess barriers to discharge/monitor patient progress toward functional and medical goals  Care Tool:  Bathing    Body parts bathed by patient: Right arm, Left arm, Abdomen, Chest, Front perineal area, Buttocks, Right upper leg, Left upper leg, Right lower leg, Left lower leg, Face         Bathing assist Assist Level: Minimal Assistance - Patient > 75%     Upper Body Dressing/Undressing Upper body dressing   What is the patient wearing?: Hospital gown only    Upper body assist Assist Level: Contact Guard/Touching assist    Lower Body Dressing/Undressing Lower body dressing      What is the patient wearing?: Pants     Lower body assist Assist for lower body dressing: Minimal Assistance - Patient > 75%     Toileting Toileting    Toileting assist Assist for toileting: Minimal Assistance - Patient > 75%     Transfers Chair/bed transfer  Transfers assist     Chair/bed transfer assist level: Contact  Guard/Touching assist     Locomotion Ambulation   Ambulation assist      Assist level: Contact Guard/Touching assist Assistive device: Walker-rolling Max distance: 150   Walk 10 feet activity   Assist     Assist level: Contact Guard/Touching assist Assistive device: Walker-rolling   Walk 50 feet activity   Assist Walk 50 feet with 2 turns activity did not occur: Safety/medical concerns (fatigue, knee pain)  Assist level: Contact Guard/Touching assist Assistive device: Walker-rolling    Walk 150 feet activity   Assist Walk 150 feet activity did not occur: Safety/medical concerns (fatigue, knee pain)  Assist level: Contact  Guard/Touching assist Assistive device: Walker-rolling    Walk 10 feet on uneven surface  activity   Assist Walk 10 feet on uneven surfaces activity did not occur: Safety/medical concerns (fatigue, knee pain)   Assist level: Contact Guard/Touching assist Assistive device: Walker-rolling   Wheelchair     Assist Is the patient using a wheelchair?: Yes Type of Wheelchair: Manual    Wheelchair assist level: Dependent - Patient 0% Max wheelchair distance: 146ft    Wheelchair 50 feet with 2 turns activity    Assist        Assist Level: Dependent - Patient 0%   Wheelchair 150 feet activity     Assist      Assist Level: Dependent - Patient 0%   Blood pressure 128/70, pulse 61, temperature 98 F (36.7 C), temperature source Oral, resp. rate 18, height 5' 10 (1.778 m), weight 60.1 kg, SpO2 96%.    Medical Problem List and Plan: 1. Functional deficits secondary to debility after large volume pulmonary embolism S/P TPA with cardiogenic shock and transient PEA arrest             -patient may shower             -ELOS/Goals: PT/OT mod I, 7 to 9 days             -Continue CIR  Sent staff message to team to determine d/c date tomorrow given patient's excellent progress  2.  Antithrombotics: -DVT/anticoagulation:  Mechanical: Sequential compression devices, below knee Bilateral lower extremities Pharmaceutical: continue Eliquis , plan for lifelong anticoagulation             -antiplatelet therapy: n/a  3. Pain Management: continue Tylenol  as needed  4. Mood/Behavior/Sleep: LCSW to follow for evaluation and support when available.              -antipsychotic agents: n/a  5. Neuropsych/cognition: This patient is capable of making decisions on his own behalf.  6. HTN: BP is normal- decrease amlodipine  to 2.5mg  daily  7. Hyperglycemia: well controlled, d/c CBG checks.  8.  Acute hypoxic respiratory failure due to PE: continue Eliquis  5 mg BID.  9.   Transaminitis-improving felt to be likely due to hypotension continue to monitor  10.  AKI: Cr reviewed and has normalized  11.  Hyponatremia: Mild NA <132<134   12.  RV strain r/t PE: Will need repeat echocardiogram as outpatient  13.  R knee pain: voltaren  gel, knee sleeve ordered, continue these  14.  Anemia: Hgb reviewed and is steadily improving  15.  BPH?  Previously on saw palmetto, likely best to hold off on this due to Eliquis .  Denies having significant symptoms at this time.  LOS: 4 days A FACE TO FACE EVALUATION WAS PERFORMED  Don Howard Don Howard 01/21/2024, 12:29 PM

## 2024-01-22 DIAGNOSIS — D649 Anemia, unspecified: Secondary | ICD-10-CM | POA: Diagnosis not present

## 2024-01-22 DIAGNOSIS — I10 Essential (primary) hypertension: Secondary | ICD-10-CM

## 2024-01-22 DIAGNOSIS — N179 Acute kidney failure, unspecified: Secondary | ICD-10-CM | POA: Diagnosis not present

## 2024-01-22 DIAGNOSIS — R5381 Other malaise: Secondary | ICD-10-CM | POA: Diagnosis not present

## 2024-01-22 DIAGNOSIS — N400 Enlarged prostate without lower urinary tract symptoms: Secondary | ICD-10-CM | POA: Diagnosis not present

## 2024-01-22 LAB — BASIC METABOLIC PANEL WITH GFR
Anion gap: 7 (ref 5–15)
BUN: 13 mg/dL (ref 8–23)
CO2: 25 mmol/L (ref 22–32)
Calcium: 8.6 mg/dL — ABNORMAL LOW (ref 8.9–10.3)
Chloride: 105 mmol/L (ref 98–111)
Creatinine, Ser: 0.82 mg/dL (ref 0.61–1.24)
GFR, Estimated: >60 mL/min (ref >60)
Glucose, Bld: 95 mg/dL (ref 70–99)
Potassium: 4.5 mmol/L (ref 3.5–5.1)
Sodium: 137 mmol/L (ref 135–145)

## 2024-01-22 LAB — CBC WITH DIFFERENTIAL/PLATELET
Abs Immature Granulocytes: 0.03 K/uL (ref 0.00–0.07)
Basophils Absolute: 0.1 K/uL (ref 0.0–0.1)
Basophils Relative: 1 %
Eosinophils Absolute: 0.2 K/uL (ref 0.0–0.5)
Eosinophils Relative: 2 %
HCT: 35 % — ABNORMAL LOW (ref 39.0–52.0)
Hemoglobin: 11.7 g/dL — ABNORMAL LOW (ref 13.0–17.0)
Immature Granulocytes: 0 %
Lymphocytes Relative: 36 %
Lymphs Abs: 2.6 K/uL (ref 0.7–4.0)
MCH: 31.3 pg (ref 26.0–34.0)
MCHC: 33.4 g/dL (ref 30.0–36.0)
MCV: 93.6 fL (ref 80.0–100.0)
Monocytes Absolute: 0.6 K/uL (ref 0.1–1.0)
Monocytes Relative: 8 %
Neutro Abs: 3.8 K/uL (ref 1.7–7.7)
Neutrophils Relative %: 53 %
Platelets: 447 K/uL — ABNORMAL HIGH (ref 150–400)
RBC: 3.74 MIL/uL — ABNORMAL LOW (ref 4.22–5.81)
RDW: 12.3 % (ref 11.5–15.5)
WBC: 7.2 K/uL (ref 4.0–10.5)
nRBC: 0 % (ref 0.0–0.2)

## 2024-01-22 LAB — GLUCOSE, CAPILLARY: Glucose-Capillary: 104 mg/dL — ABNORMAL HIGH (ref 70–99)

## 2024-01-22 MED ORDER — POLYETHYLENE GLYCOL 3350 17 G PO PACK
17.0000 g | PACK | Freq: Two times a day (BID) | ORAL | Status: DC
  Administered 2024-01-24 – 2024-01-26 (×5): 17 g via ORAL
  Filled 2024-01-22 (×8): qty 1

## 2024-01-22 MED ORDER — LIDOCAINE 5 % EX PTCH
1.0000 | MEDICATED_PATCH | CUTANEOUS | Status: DC
  Administered 2024-01-22 – 2024-01-23 (×2): 1 via TRANSDERMAL
  Filled 2024-01-22 (×2): qty 1

## 2024-01-22 MED ORDER — SENNOSIDES-DOCUSATE SODIUM 8.6-50 MG PO TABS
1.0000 | ORAL_TABLET | Freq: Two times a day (BID) | ORAL | Status: DC
  Administered 2024-01-22 – 2024-01-26 (×8): 1 via ORAL
  Filled 2024-01-22 (×8): qty 1

## 2024-01-22 MED ORDER — BISACODYL 10 MG RE SUPP: 10.0000 mg | Freq: Every day | RECTAL | Status: DC | PRN

## 2024-01-22 NOTE — Progress Notes (Signed)
 Occupational Therapy Session Note  Patient Details  Name: Don Howard MRN: [id number] Date of Birth: Sep 01, 1926  Today's Date: 01/22/2024 OT Individual Time: 1105-1200 & 1335-1415 OT Individual Time Calculation (min): 55 min & 40 min   Short Term Goals: Week 1:  OT Short Term Goal 1 (Week 1): Pt will perform toileting tasks mod I (sit to stand) OT Short Term Goal 2 (Week 1): Pt will obtain clothing from drawers/ closet with LRAD mod I OT Short Term Goal 3 (Week 1): Pt will perform simple meal prep in kitchen with supervision with LRAD in standing  Skilled Therapeutic Interventions/Progress Updates:  Session 1 Skilled OT intervention completed with focus on DC planning, DME education, shower transfers. Pt received seated in w/c, agreeable to session. Chest soreness and R knee pain reported; nurse and NP made aware for pain intervention. OT offered rest breaks, repositioning throughout for pain reduction.  Pt declined self-care needs. Pt reported plan to return to his home with walk in shower and tub/shower but current fear of using either due to fall frequency. Completed CGA sit > stand and CGA fading to close supervision ambulatory transfer using rollator <> ADL apartment with intermittent seated breaks for fatigue.  Demo provided on TTB for tub/shower. Advised use of hand held shower head for ease of bathing. Demonstrated method of tucking shower curtain under buttocks to prevent water spillage in floor. Discussed using lateral leans for peri-washing. Pt was able to return demo supervision ambulatory transfer with rollator from recliner <> TTB. Cues needed for body positioning and safety. Had pt also trial walk in shower transfer with shower frame simulator- required CGA and max cueing for body positioning to step into shower with shower chair. Pt reports feeling most comfortable with TTB. CSW notified of TTB need for DC.  Back in room, pt remained upright in bed, with bed alarm  on/activated, and with all needs in reach at end of session.  Session 2 Skilled OT intervention completed with focus on ambulatory endurance, dynamic standing balance. Pt received upright in bed, agreeable to session. R knee pain reported; pre-medicated. OT offered rest breaks throughout for pain reduction.  Pt voiced need to void- set up A for urinal. Mod I bed mobility to EOB, able to tuck shirt and clothing into pants with supervision. Completed all sit > stands and ambulatory transfers using rollator with close supervision during session.  Ambulated 150 ft > gym. Pt participated in the following dynamic standing balance and endurance tasks to promote independence and safety during BADLs and functional mobility: -cornhole activity. Stood at [employer], and transferred horse shoes from outside BOS <> top of long mirror; CGA required for balance without AD. Intermittent seated rest for fatigue. Transitioned to corn hole toss activity; CGA for standing balance without AD however no LOB  Ambulated > room 150 ft > room. Doffed shoes without assist. Mod I bed mobility. Pt remained upright in bed, with bed alarm on/activated, and with all needs in reach at end of session.   Therapy Documentation Precautions:  Precautions Precautions: Fall Restrictions Weight Bearing Restrictions Per Provider Order: No   Therapy/Group: Individual Therapy  [name], MS, OTR/L  01/22/2024, 3:54 PM

## 2024-01-22 NOTE — Progress Notes (Signed)
 PROGRESS NOTE   Subjective/Complaints: No new complaints or concerns today.  Patient reports he continues to have some continued muscle soreness along his ribs bilaterally, worsened by sneezing or coughing.  This pain is overall under control and is not bad  ROS: Denies, shortness of breath, abdominal pain, nausea, vomiting  +right knee pain- improved with knee sleeve, +post exercise muscle soreness- improved   Objective:   No results found. Recent Labs    01/22/24 0614  WBC 7.2  HGB 11.7*  HCT 35.0*  PLT 447*    Recent Labs    01/22/24 0614  NA 137  K 4.5  CL 105  CO2 25  GLUCOSE 95  BUN 13  CREATININE 0.82  CALCIUM  8.6*     Intake/Output Summary (Last 24 hours) at 01/22/2024 1717 Last data filed at 01/21/2024 2355 Gross per 24 hour  Intake 200 ml  Output 575 ml  Net -375 ml        Physical Exam: Vital Signs Blood pressure (!) 133/58, pulse 60, temperature 97.8 F (36.6 C), resp. rate 18, height 5' 10 (1.778 m), weight 60.1 kg, SpO2 98%. Gen: no distress, normal appearing HEENT: oral mucosa pink and moist, NCAT Cardio: RRR Chest: CTAB, normal effort, normal rate of breathing Abd: soft, non-distended, positive bowel sounds Ext: no edema Psych: pleasant, normal affect Skin: intact Mental Status: AAOx4, memory intact, normal insight and awareness Speech/Languate: No speech or language deficits noted, follows simple commands CRANIAL NERVES: II: PERRL. Visual fields full III, IV, VI: EOM intact, no gaze preference or deviation V: normal sensation bilaterally VII: no asymmetry VIII: hard of hearing  IX, X: normal palatal elevation XI: 5/5 head turn and 5/5 shoulder shrug bilaterally XII: Tongue midline     MOTOR: RUE: 5/5 Deltoid, 5/5 Biceps, 5/5 Triceps,5/5 Grip LUE: 5/5 Deltoid, 5/5 Biceps, 5/5 Triceps, 5/5 Grip RLE: HF 4-/5, KE 4/5, ADF 4+/5, APF 4+/5 LLE: HF 4/5, KE 4+/5, ADF 4+/5, APF  4+/5 Stable 8/17   SENSORY: Normal to touch all 4 extremities   Coordination: Normal finger to nose b/l   No abnormal tone noted   MSK: R knee pain with ROM, atrophy intrinsic muscles of b/l hands  Assessment/Plan: 1. Functional deficits which require 3+ hours per day of interdisciplinary therapy in a comprehensive inpatient rehab setting. Physiatrist is providing close team supervision and 24 hour management of active medical problems listed below. Physiatrist and rehab team continue to assess barriers to discharge/monitor patient progress toward functional and medical goals  Care Tool:  Bathing    Body parts bathed by patient: Right arm, Left arm, Abdomen, Chest, Front perineal area, Buttocks, Right upper leg, Left upper leg, Right lower leg, Left lower leg, Face         Bathing assist Assist Level: Minimal Assistance - Patient > 75%     Upper Body Dressing/Undressing Upper body dressing   What is the patient wearing?: Pull over shirt    Upper body assist Assist Level: Minimal Assistance - Patient > 75% (per PT note)    Lower Body Dressing/Undressing Lower body dressing      What is the patient wearing?: Pants  Lower body assist Assist for lower body dressing: Minimal Assistance - Patient > 75%     Toileting Toileting    Toileting assist Assist for toileting: Minimal Assistance - Patient > 75%     Transfers Chair/bed transfer  Transfers assist     Chair/bed transfer assist level: Contact Guard/Touching assist     Locomotion Ambulation   Ambulation assist      Assist level: Contact Guard/Touching assist Assistive device: Walker-rolling Max distance: 150   Walk 10 feet activity   Assist     Assist level: Contact Guard/Touching assist Assistive device: Walker-rolling   Walk 50 feet activity   Assist Walk 50 feet with 2 turns activity did not occur: Safety/medical concerns (fatigue, knee pain)  Assist level: Contact Guard/Touching  assist Assistive device: Walker-rolling    Walk 150 feet activity   Assist Walk 150 feet activity did not occur: Safety/medical concerns (fatigue, knee pain)  Assist level: Contact Guard/Touching assist Assistive device: Walker-rolling    Walk 10 feet on uneven surface  activity   Assist Walk 10 feet on uneven surfaces activity did not occur: Safety/medical concerns (fatigue, knee pain)   Assist level: Contact Guard/Touching assist Assistive device: Walker-rolling   Wheelchair     Assist Is the patient using a wheelchair?: Yes Type of Wheelchair: Manual    Wheelchair assist level: Dependent - Patient 0% Max wheelchair distance: 16ft    Wheelchair 50 feet with 2 turns activity    Assist        Assist Level: Dependent - Patient 0%   Wheelchair 150 feet activity     Assist      Assist Level: Dependent - Patient 0%   Blood pressure (!) 133/58, pulse 60, temperature 97.8 F (36.6 C), resp. rate 18, height 5' 10 (1.778 m), weight 60.1 kg, SpO2 98%.    Medical Problem List and Plan: 1. Functional deficits secondary to debility after large volume pulmonary embolism S/P TPA with cardiogenic shock and transient PEA arrest             -patient may shower             -ELOS/Goals: PT/OT mod I, 7 to 9 days             -Continue CIR  Sent staff message to team to determine d/c date tomorrow given patient's excellent progress  2.  Antithrombotics: -DVT/anticoagulation:  Mechanical: Sequential compression devices, below knee Bilateral lower extremities Pharmaceutical: continue Eliquis , plan for lifelong anticoagulation             -antiplatelet therapy: n/a  3. Pain Management: continue Tylenol  as needed  4. Mood/Behavior/Sleep: LCSW to follow for evaluation and support when available.              -antipsychotic agents: n/a  5. Neuropsych/cognition: This patient is capable of making decisions on his own behalf.  6. HTN: BP is normal- decrease  amlodipine  to 2.5mg  daily  - 8/18 well-controlled overall continue to monitor    01/22/2024    1:10 PM 01/22/2024    4:16 AM 01/21/2024    8:00 PM  Vitals with BMI  Systolic 133 113 885  Diastolic 58 62 60  Pulse 60 56 54    7. Hyperglycemia: well controlled, d/c CBG checks.  8.  Acute hypoxic respiratory failure due to PE: continue Eliquis  5 mg BID.  9.  Transaminitis-improving felt to be likely due to hypotension continue to monitor  10.  AKI: Cr reviewed and has  normalized  - 8/18 Creatinine stable at 0.82/BUN 13  11.  Hyponatremia: Mild NA <132<134   - 8/18 sodium up to 137  12.  RV strain r/t PE: Will need repeat echocardiogram as outpatient  13.  R knee pain: voltaren  gel, knee sleeve ordered, continue these  14.  Anemia: Hgb reviewed and is steadily improving  - 8/18 hemoglobin stable at 11.7  15.  BPH?  Previously on saw palmetto, likely best to hold off on this due to Eliquis .  Denies having significant symptoms at this time.  - 8/18 patient has been continent of bladder for the past couple days  LOS: 5 days A FACE TO FACE EVALUATION WAS PERFORMED  Murray Collier 01/22/2024, 5:17 PM

## 2024-01-22 NOTE — Progress Notes (Signed)
 Physical Therapy Session Note  Patient Details  Name: Don Howard MRN: [id number] Date of Birth: Oct 08, 1926  Today's Date: 01/22/2024 PT Individual Time: 9084-9058 PT Individual Time Calculation (min): 26 min   Short Term Goals: Week 1:  PT Short Term Goal 1 (Week 1): pt will perform all transfers with LRAD and CGA PT Short Term Goal 2 (Week 1): pt will ambulate 81ft with LRAD and CGA PT Short Term Goal 3 (Week 1): pt will navigate 4 6in steps with 1 HR and CGA  Skilled Therapeutic Interventions/Progress Updates:      Pt presents in wheelchair - alert and agreeable to PT treatment. Pt wearing his R knee brace during session - he has no complaints of pain. Pt recalls this PT from Friday's therapy session without cues.   Sit<>stand to rollator with CGA with min cues for locking brakes prior to standing. He ambulates with CGA to close supervision with rollator from his room to day room gym, ~11ft. Min safety cues for awareness.   Seated rest break before beginning Nustep at L4 resistance using BLE and BUE to target whole body strengthening and endurance training. He completed x10 minutes straight without a rest break, covering 450 steps and 0.25 miles. Pt verbose throughout, recalling events that led to his his hospitalization.  He returned to his room 164ft with rollator and close supervision - min cues for safety while turning and while approaching his w/c to sit. Pt electing to stay up in w/c until his next therapy treatment. Call bell within reach.   Therapy Documentation Precautions:  Precautions Precautions: Fall Restrictions Weight Bearing Restrictions Per Provider Order: No General:    Therapy/Group: Individual Therapy  [name] 01/22/2024, 7:49 AM

## 2024-01-22 NOTE — Progress Notes (Incomplete)
 No documented BM since 8/

## 2024-01-22 NOTE — Plan of Care (Signed)
  Problem: Consults Goal: RH GENERAL PATIENT EDUCATION Description: See Patient Education module for education specifics. Outcome: Progressing   Problem: RH BOWEL ELIMINATION Goal: RH STG MANAGE BOWEL WITH ASSISTANCE Description: STG Manage Bowel with mod I Assistance. Outcome: Progressing   Problem: RH BLADDER ELIMINATION Goal: RH STG MANAGE BLADDER WITH ASSISTANCE Description: STG Manage Bladder With mod I  Assistance Outcome: Progressing   Problem: RH SKIN INTEGRITY Goal: RH STG SKIN FREE OF INFECTION/BREAKDOWN Description: Manage skin free of infection/ breakdown  Outcome: Progressing   Problem: RH SAFETY Goal: RH STG ADHERE TO SAFETY PRECAUTIONS W/ASSISTANCE/DEVICE Description: STG Adhere to Safety Precautions With  mod I Assistance/Device. Outcome: Progressing   Problem: RH PAIN MANAGEMENT Goal: RH STG PAIN MANAGED AT OR BELOW PT'S PAIN GOAL Description: <4 w/ prns Outcome: Progressing   Problem: RH KNOWLEDGE DEFICIT GENERAL Goal: RH STG INCREASE KNOWLEDGE OF SELF CARE AFTER HOSPITALIZATION Description: Manage knowledge deficit with mod I assistance from grandson using educational materials provided Outcome: Progressing

## 2024-01-22 NOTE — Progress Notes (Signed)
 Physical Therapy Session Note  Patient Details  Name: Don Howard MRN: [id number] Date of Birth: Dec 13, 1926  Today's Date: 01/22/2024 PT Individual Time: 0805-0900 PT Individual Time Calculation (min): 55 min   Short Term Goals: Week 1:  PT Short Term Goal 1 (Week 1): pt will perform all transfers with LRAD and CGA PT Short Term Goal 2 (Week 1): pt will ambulate 41ft with LRAD and CGA PT Short Term Goal 3 (Week 1): pt will navigate 4 6in steps with 1 HR and CGA  Skilled Therapeutic Interventions/Progress Updates:     Pt received semi reclined in bed and agrees to therapy. Reports some tightness and soreness in chest. PT provides rest breaks as needed to manage pain. Pt requests to get dressed prior to leaving room. Pt performs supine to sit with bed features and cues for positioning at EOB. Pt able to perform upper and lower body dressing with setup assistance and increased time, with PT providing close supervision with pt performing sit to stand from edge of bed, utilizing backs of legs on side of bed for posterior stability.   Pt performs sit to stand and stand step transfer to [facility] with CGA and cues for sequencing and positioning. WC transport to gym for time management. Pt performs sit to stand with CGA and ambulates x60' with hurrycane and minA, with notable staggered gait pattern, appearing antalgic and unsteady. Following rest break, PT provides pt with rollator and re-educates on safe use. Pt then ambulates x175' with CGA and much improved gait pattern, with faster velocity and less staggering. Pt does demonstrate decreased safety awareness when approaching mat table with rollator, and requires cueing on optimal sequencing when transitioning to seated position when using rollator. Pt takes extended seated rest break. Pt then ambulates x175' with rollator and demonstrates much improved safety when transitioning back to mat, requiring fewer cues for safety.   Pt transfers back to [facility] with CGA  and cues for hand placement. Left seated with all needs within reach.   Therapy Documentation Precautions:  Precautions Precautions: Fall Restrictions Weight Bearing Restrictions Per Provider Order: No   Therapy/Group: Individual Therapy  Elsie JAYSON Dawn, PT, DPT 01/22/2024, 5:04 PM

## 2024-01-23 DIAGNOSIS — R5381 Other malaise: Principal | ICD-10-CM

## 2024-01-23 MED ORDER — MELATONIN 3 MG PO TABS
1.5000 mg | ORAL_TABLET | Freq: Every day | ORAL | Status: DC
  Administered 2024-01-23 – 2024-01-25 (×3): 1.5 mg via ORAL
  Filled 2024-01-23 (×3): qty 1

## 2024-01-23 NOTE — Progress Notes (Signed)
 Family ed scheduled with grandson Don Howard between 1pm-4pm on Wednesday 8/20.

## 2024-01-23 NOTE — Progress Notes (Signed)
 PROGRESS NOTE   Subjective/Complaints: Family ed scheduled D/c date set for 3 days Tolerating therapy well  No issues overnight   ROS: Denies shortness of breath, abdominal pain, nausea, vomiting  +right knee pain- improved with knee sleeve, +post exercise muscle soreness- improved   Objective:   No results found. Recent Labs    01/22/24 0614  WBC 7.2  HGB 11.7*  HCT 35.0*  PLT 447*    Recent Labs    01/22/24 0614  NA 137  K 4.5  CL 105  CO2 25  GLUCOSE 95  BUN 13  CREATININE 0.82  CALCIUM  8.6*     Intake/Output Summary (Last 24 hours) at 01/23/2024 1300 Last data filed at 01/23/2024 0645 Gross per 24 hour  Intake 250 ml  Output 1475 ml  Net -1225 ml        Physical Exam: Vital Signs Blood pressure (!) 123/58, pulse (!) 56, temperature (!) 97.5 F (36.4 C), temperature source Oral, resp. rate 16, height 5' 10 (1.778 m), weight 60.1 kg, SpO2 95%. Gen: no distress, normal appearing HEENT: oral mucosa pink and moist, NCAT Cardio: Bradycardia Chest: CTAB, normal effort, normal rate of breathing Abd: soft, non-distended, positive bowel sounds Ext: no edema Psych: pleasant, normal affect Skin: intact Mental Status: AAOx4, memory intact, normal insight and awareness Speech/Languate: No speech or language deficits noted, follows simple commands CRANIAL NERVES: II: PERRL. Visual fields full III, IV, VI: EOM intact, no gaze preference or deviation V: normal sensation bilaterally VII: no asymmetry VIII: hard of hearing  IX, X: normal palatal elevation XI: 5/5 head turn and 5/5 shoulder shrug bilaterally XII: Tongue midline     MOTOR: RUE: 5/5 Deltoid, 5/5 Biceps, 5/5 Triceps,5/5 Grip LUE: 5/5 Deltoid, 5/5 Biceps, 5/5 Triceps, 5/5 Grip RLE: HF 4-/5, KE 4/5, ADF 4+/5, APF 4+/5 LLE: HF 4/5, KE 4+/5, ADF 4+/5, APF 4+/5 Stable 8/19   SENSORY: Normal to touch all 4 extremities   Coordination:  Normal finger to nose b/l   No abnormal tone noted   MSK: R knee pain with ROM, atrophy intrinsic muscles of b/l hands  Assessment/Plan: 1. Functional deficits which require 3+ hours per day of interdisciplinary therapy in a comprehensive inpatient rehab setting. Physiatrist is providing close team supervision and 24 hour management of active medical problems listed below. Physiatrist and rehab team continue to assess barriers to discharge/monitor patient progress toward functional and medical goals  Care Tool:  Bathing    Body parts bathed by patient: Right arm, Left arm, Abdomen, Chest, Front perineal area, Buttocks, Right upper leg, Left upper leg, Right lower leg, Left lower leg, Face         Bathing assist Assist Level: Minimal Assistance - Patient > 75%     Upper Body Dressing/Undressing Upper body dressing   What is the patient wearing?: Pull over shirt    Upper body assist Assist Level: Minimal Assistance - Patient > 75% (per PT note)    Lower Body Dressing/Undressing Lower body dressing      What is the patient wearing?: Pants     Lower body assist Assist for lower body dressing: Minimal Assistance - Patient > 75%  Toileting Toileting    Toileting assist Assist for toileting: Minimal Assistance - Patient > 75%     Transfers Chair/bed transfer  Transfers assist     Chair/bed transfer assist level: Supervision/Verbal cueing     Locomotion Ambulation   Ambulation assist      Assist level: Supervision/Verbal cueing Assistive device: Rollator Max distance: 172ft   Walk 10 feet activity   Assist     Assist level: Supervision/Verbal cueing Assistive device: Rollator   Walk 50 feet activity   Assist Walk 50 feet with 2 turns activity did not occur: Safety/medical concerns (fatigue, knee pain)  Assist level: Supervision/Verbal cueing Assistive device: Rollator    Walk 150 feet activity   Assist Walk 150 feet activity did not  occur: Safety/medical concerns (fatigue, knee pain)  Assist level: Supervision/Verbal cueing Assistive device: Rollator    Walk 10 feet on uneven surface  activity   Assist Walk 10 feet on uneven surfaces activity did not occur: Safety/medical concerns (fatigue, knee pain)   Assist level: Contact Guard/Touching assist Assistive device: Walker-rolling   Wheelchair     Assist Is the patient using a wheelchair?: Yes Type of Wheelchair: Manual    Wheelchair assist level: Dependent - Patient 0% Max wheelchair distance: 172ft    Wheelchair 50 feet with 2 turns activity    Assist        Assist Level: Dependent - Patient 0%   Wheelchair 150 feet activity     Assist      Assist Level: Dependent - Patient 0%   Blood pressure (!) 123/58, pulse (!) 56, temperature (!) 97.5 F (36.4 C), temperature source Oral, resp. rate 16, height 5' 10 (1.778 m), weight 60.1 kg, SpO2 95%.    Medical Problem List and Plan: 1. Functional deficits secondary to debility after large volume pulmonary embolism S/P TPA with cardiogenic shock and transient PEA arrest             -patient may shower             -ELOS/Goals: PT/OT mod I, 7 to 9 days             -continue CIR  Set d/c date for 8/22  2.  Antithrombotics: -DVT/anticoagulation:  Mechanical: Sequential compression devices, below knee Bilateral lower extremities Pharmaceutical: continue Eliquis , plan for lifelong anticoagulation             -antiplatelet therapy: n/a  3. Pain Management: continue Tylenol  as needed  4. Mood/Behavior/Sleep: LCSW to follow for evaluation and support when available.              -antipsychotic agents: n/a  5. Neuropsych/cognition: This patient is capable of making decisions on his own behalf.  6. HTN: BP is normal- d/c amlodipine   - 8/18 well-controlled overall continue to monitor    01/23/2024   10:20 AM 01/23/2024    6:20 AM 01/23/2024    6:08 AM  Vitals with BMI  Systolic 123 128    Diastolic 58 66   Pulse 56 57 49    7. Hyperglycemia: well controlled, d/c CBG checks.  8.  Acute hypoxic respiratory failure due to PE: continue Eliquis  5 mg BID.  9.  Transaminitis-improving felt to be likely due to hypotension continue to monitor  10.  AKI: Cr reviewed and has normalized  - 8/18 Creatinine stable at 0.82/BUN 13  11.  Hyponatremia: Mild NA <132<134   - 8/18 sodium up to 137  12.  RV strain r/t PE:  Will need repeat echocardiogram as outpatient  13.  R knee pain: voltaren  gel, knee sleeve ordered, continue these  14.  Anemia: Hgb reviewed and is steadily improving  - 8/18 hemoglobin stable at 11.7  15.  BPH?  Previously on saw palmetto, likely best to hold off on this due to Eliquis .  Denies having significant symptoms at this time.  - 8/18 patient has been continent of bladder for the past couple days  LOS: 6 days A FACE TO FACE EVALUATION WAS PERFORMED  Keshonda Monsour P Kareen Hitsman 01/23/2024, 1:00 PM

## 2024-01-23 NOTE — Plan of Care (Signed)
  Problem: Consults Goal: RH GENERAL PATIENT EDUCATION Description: See Patient Education module for education specifics. Outcome: Progressing   Problem: RH BOWEL ELIMINATION Goal: RH STG MANAGE BOWEL WITH ASSISTANCE Description: STG Manage Bowel with mod I Assistance. Outcome: Progressing   Problem: RH BLADDER ELIMINATION Goal: RH STG MANAGE BLADDER WITH ASSISTANCE Description: STG Manage Bladder With mod I  Assistance Outcome: Progressing   Problem: RH SKIN INTEGRITY Goal: RH STG SKIN FREE OF INFECTION/BREAKDOWN Description: Manage skin free of infection/ breakdown  Outcome: Progressing   Problem: RH SAFETY Goal: RH STG ADHERE TO SAFETY PRECAUTIONS W/ASSISTANCE/DEVICE Description: STG Adhere to Safety Precautions With  mod I Assistance/Device. Outcome: Progressing   Problem: RH PAIN MANAGEMENT Goal: RH STG PAIN MANAGED AT OR BELOW PT'S PAIN GOAL Description: <4 w/ prns Outcome: Progressing   Problem: RH KNOWLEDGE DEFICIT GENERAL Goal: RH STG INCREASE KNOWLEDGE OF SELF CARE AFTER HOSPITALIZATION Description: Manage knowledge deficit with mod I assistance from grandson using educational materials provided Outcome: Progressing
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
## 2024-01-23 NOTE — Progress Notes (Signed)
 Occupational Therapy Session Note  Patient Details  Name: Don Howard MRN: [id number] Date of Birth: 06-19-1926  Today's Date: 01/23/2024 OT Individual Time: 8994-8884 OT Individual Time Calculation (min): 70 min    Short Term Goals: Week 1:  OT Short Term Goal 1 (Week 1): Pt will perform toileting tasks mod I (sit to stand) OT Short Term Goal 2 (Week 1): Pt will obtain clothing from drawers/ closet with LRAD mod I OT Short Term Goal 3 (Week 1): Pt will perform simple meal prep in kitchen with supervision with LRAD in standing  Skilled Therapeutic Interventions/Progress Updates:  Session 1 Skilled OT intervention completed with focus on ambulatory endurance, cognition, activity tolerance, dynamic standing balance. Pt received upright in bed, agreeable to session. Unrated pain reported in R knee; nurse notified of pain med request. OT offered rest breaks, repositioning throughout for pain reduction.  Pt declined shower offer however did request privacy for urinal use for continent void at EOB. No assist needed. Donned shoes independently. Completed all sit > stands and ambulatory transfers using rollator with supervision during session for greater than household distance at a time.  Ambulated > gym. Pt participated in the following activities in standing to address cognitive strategies needed for independence and safety with BADL management: -Motor speed dot test- 1.68 sec, no difficulty -Trail Making B- unable to complete without max A; results indicative of deficits with higher level cognition and sequencing abilities -Downgraded task to Trail Making A- 2:26 min, 6 errors; had pt sit to eliminate dual tasking for increased efficiency -Visual/verbal STM assessment- 80% accuracy on trials with 6 words in the sequence. STM deficit noted but potential challenge with selective attention as gym space was moderately distracting. 66% on 2nd trial  Seated EOM, pt completed the following BUE  exercises to promote BUE strength/endurance needed for independence with functional transfers and BADLs: (With 4 lb dowel) -2x15 chest press -2x15 bicep curls  Ambulated > room. Mod I bed mobility. Pt remained semi supine in bed, with bed alarm on/activated, and with all needs in reach at end of session.   Therapy Documentation Precautions:  Precautions Precautions: Fall Restrictions Weight Bearing Restrictions Per Provider Order: No   Therapy/Group: Individual Therapy  [name], MS, OTR/L  01/23/2024, 12:20 PM

## 2024-01-23 NOTE — Progress Notes (Signed)
 9579 - This RN obtained routine morning VS. HR on dinamap reading 30. Apical pulse checked and was 28. All other VS stable including BP of 119/71. Patient asymptomatic. Resting in bed about to drink his morning coffee. Charge RN [employer]. Attempting to obtain EKG. 9556 - Rapid response team notified of above. EKG done showing frequent PVCs with HR 61.  0455 GLENWOOD Share, PA notified. Attempting to obtain another EKG. IV team established access. [id number] GLENWOOD Share, PA notified that second EKG is showing rate to be in the 50's, although EKG did not save or print. No new orders.   9474 GLENWOOD Share, PA to bedside to see patient. Patient in NAD, drinking his coffee. No new orders.

## 2024-01-24 DIAGNOSIS — R5381 Other malaise: Secondary | ICD-10-CM | POA: Diagnosis not present

## 2024-01-24 MED ORDER — MEDIHONEY WOUND/BURN DRESSING EX PSTE
1.0000 | PASTE | Freq: Every day | CUTANEOUS | Status: DC
  Administered 2024-01-24 – 2024-01-26 (×3): 1 via TOPICAL
  Filled 2024-01-24: qty 44
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
## 2024-01-24 NOTE — Progress Notes (Addendum)
 Occupational Therapy Session Note  Patient Details  Name: Don Howard MRN: [id number] Date of Birth: [birth year]  Today's Date: 01/24/2024 OT Individual Time: 0950-1030 OT Individual Time Calculation (min): 40 min    Short Term Goals: Week 1:  OT Short Term Goal 1 (Week 1): Pt will perform toileting tasks mod I (sit to stand) OT Short Term Goal 2 (Week 1): Pt will obtain clothing from drawers/ closet with LRAD mod I OT Short Term Goal 3 (Week 1): Pt will perform simple meal prep in kitchen with supervision with LRAD in standing  Skilled Therapeutic Interventions/Progress Updates:  Skilled OT session completed to address ADL retraining, cognition, and balance. Pt received in bed sleeping, agreeable to participate in therapy. Pt reports no pain.   Pt completed UB/LB dressing EOB with Set up A. Pt STS intermittently during task without rollator and supervision, don/doffing footwear independently. Ambulated 100 ft with rollator with Sup.   Pt completed peg board/picture matching task 2x in standing at table with supervision. Education provided on performing tasks in standing within the home when performing IADLs. Pt reports performing sweeping, cooking and outdoor work in home environment. Education provided on limited use of yard work equipment upon arrival to home until pt follows up with PCP, Pt verbalized understanding. Pt completed simulated sweeping w/ hockey puck and badminton birdie without rollator with CGA. Education on modifying activity to collect trash seated. Pt displayed ability to grab objects from the floor with supervision, OT recommended modified approach within home; pt verbalized and displayed understanding.   Ambulated 100 ft with rollator Sup A. Pt returned to bed with alarms on and all needs met.   Therapy Documentation Precautions:  Precautions Precautions: Fall Restrictions Weight Bearing Restrictions Per Provider Order: No   Therapy/Group: Individual  Therapy  Don FORBES Fritter, MS, OTR/L  01/24/2024, 3:58 PM

## 2024-01-24 NOTE — Progress Notes (Signed)
 Physical Therapy Session Note  Patient Details  Name: Don Howard MRN: [id number] Date of Birth: September 18, 1926  Today's Date: 01/24/2024 PT Individual Time: [phone] and [id number] PT Individual Time Calculation (min): 40 min and 57 min  Short Term Goals: Week 1:  PT Short Term Goal 1 (Week 1): pt will perform all transfers with LRAD and CGA PT Short Term Goal 2 (Week 1): pt will ambulate 57ft with LRAD and CGA PT Short Term Goal 3 (Week 1): pt will navigate 4 6in steps with 1 HR and CGA  Skilled Therapeutic Interventions/Progress Updates:   Treatment Session 1 Received pt semi-reclined in bed, pt agreeable to PT treatment, and denied any pain during session - noticed burn on pt's lower lip from eating hot soup - MD notified. Pt also requesting IV to be removed - notified MD. Pt requesting to shave and clean teeth this morning. Pt transferred semi-reclined<>sitting L EOB with HOB elevated and mod I and performed stand<>pivot into WC without AD and supervision. Pt sat in WC at sink and cleaned teeth and shaved with setup assist and significantly increased time. Pt requesting to shower this morning - primary OT notified. Concluded session with pt sitting in WC shaving - handoff to NT due to time restrictions.   Treatment Session 2 Received pt semi-reclined in bed - waiting on grandson to arrive for family education training. Pt agreeable to PT treatment and reported mild R knee pain with prolonged ambulation. Session with emphasis on discharge planning, functional mobility/transfers, generalized strengthening and endurance, dynamic standing balance/coordination, ambulation, stair navigation, and simulated car transfers. Pt transferred semi-reclined<>sitting L EOB with HOB elevated and mod I and donned shoes with setup assist.   Pt performed all transfers with rollator and supervision throughout session. Pt ambulated >373ft with rollator and supervision to ortho gym. Pt performed simulated car  transfer with rollator and supervision with cues to sit first, then get legs in. Discussed recommendation to avoid driving initially and to have someone with him any time he is leaving the house. Pt then ambulated 59ft on uneven surfaces (ramp) with rollator and supervision. Pt ambulated 175ft with rollator and supervision to main therapy gym. Don Howard reports pt has 1+1 STE (8in) without handrails or grab bars. Demonstrated technique for curb navigation using rollator and pt navigated 1 8n step x 2 trials with rollator and CGA fading to close supervision with min cues for technique. Pt reported he won't be going anywhere for the first week or so but verbalized understanding of recommendation to have someone with him when leaving the house. Pt ambulated 151ft with rollator and supervision back to room and concluded session with pt sitting in [facility] with all needs within reach and Don Howard at bedside.   Discussed the following with pt/grandson: - recommendation to use rollator vs SPC with mobility due to increased fall risk - need for intermittent assist and family checking in on him more frequently - need for supervision with stair navigation   Therapy Documentation Precautions:  Precautions Precautions: Fall Restrictions Weight Bearing Restrictions Per Provider Order: No  Therapy/Group: Individual Therapy Therisa HERO Zaunegger Therisa Stains PT, DPT 01/24/2024, 6:53 AM

## 2024-01-24 NOTE — Plan of Care (Signed)
  Problem: Consults Goal: RH GENERAL PATIENT EDUCATION Description: See Patient Education module for education specifics. Outcome: Progressing   Problem: RH BOWEL ELIMINATION Goal: RH STG MANAGE BOWEL WITH ASSISTANCE Description: STG Manage Bowel with mod I Assistance. Outcome: Progressing   Problem: RH BLADDER ELIMINATION Goal: RH STG MANAGE BLADDER WITH ASSISTANCE Description: STG Manage Bladder With mod I  Assistance Outcome: Progressing   Problem: RH SKIN INTEGRITY Goal: RH STG SKIN FREE OF INFECTION/BREAKDOWN Description: Manage skin free of infection/ breakdown  Outcome: Progressing   Problem: RH SAFETY Goal: RH STG ADHERE TO SAFETY PRECAUTIONS W/ASSISTANCE/DEVICE Description: STG Adhere to Safety Precautions With  mod I Assistance/Device. Outcome: Progressing   Problem: RH PAIN MANAGEMENT Goal: RH STG PAIN MANAGED AT OR BELOW PT'S PAIN GOAL Description: <4 w/ prns Outcome: Progressing   Problem: RH KNOWLEDGE DEFICIT GENERAL Goal: RH STG INCREASE KNOWLEDGE OF SELF CARE AFTER HOSPITALIZATION Description: Manage knowledge deficit with mod I assistance from grandson using educational materials provided Outcome: Progressing

## 2024-01-24 NOTE — Progress Notes (Signed)
 Occupational Therapy Session Note  Patient Details  Name: HALEEM HANNER MRN: [id number] Date of Birth: March 05, 1927  Today's Date: 01/24/2024 OT Individual Time: 1435-1530 OT Individual Time Calculation (min): 55 min    Short Term Goals: Week 1:  OT Short Term Goal 1 (Week 1): Pt will perform toileting tasks mod I (sit to stand) OT Short Term Goal 2 (Week 1): Pt will obtain clothing from drawers/ closet with LRAD mod I OT Short Term Goal 3 (Week 1): Pt will perform simple meal prep in kitchen with supervision with LRAD in standing  Skilled Therapeutic Interventions/Progress Updates:  Skilled OT intervention completed with focus on family education with grandson present. Pt received seated in w/c, agreeable to session. R knee pain reported however pt declined meds and intervention. OT offered rest breaks and repositioning throughout for pain reduction.  OT provided education on the following in prep for DC: -Recommended rollator for all mobility at the mod I level and mod I ADLs; did recommend supervision for first couple of showers for extra precaution during transition home -Confirmed bathroom set up, with grandson reporting tub/shower and walk in shower options with curtain. Advised that use of TTB would be safest for either shower considering CLOF, environmental set up and pt's balance/endurance deficits -Advised use of hand held shower head for ease of bathing. Demonstrated method of tucking shower curtain under buttocks when seated on TTB to prevent water spillage in floor. Discussed using lateral leans for peri-washing and use/installation of grab bars for balance  -Energy conservation strategies- lukewarm water, proper ventilation via fan or cracked door to reduce steam, minimizing stands in shower especially during hair washing with eyes closed, and planning ahead for shower tasks to be rested/have rest time following in case of fatigue -Reviewed use of urinal at bedside to reduce fall  risk at night when pt is less alert or for urgency -Reviewed use of BSC over top of toilet for raising the height or for use of push up rails to stand  OT assisted pt in demonstrating the following during session: -ambulatory transfers > 150 ft using rollator with supervision -tub/shower transfer with TTB with supervision/cues for technique -walk in shower transfer with CGA with mod cueing for positioning  Pt remained upright in bed, with bed alarm on/activated, and with all needs in reach at end of session.   Therapy Documentation Precautions:  Precautions Precautions: Fall Restrictions Weight Bearing Restrictions Per Provider Order: No    Therapy/Group: Individual Therapy  [name], MS, OTR/L  01/24/2024, 3:41 PM

## 2024-01-24 NOTE — Patient Care Conference (Signed)
 Inpatient RehabilitationTeam Conference and Plan of Care Update Date: 01/24/2024   Time: 11:34 AM   Patient was admitted after the interdisciplinary team meeting took place on 01/17/2024; as a result, the first team conference occurred on day 8 of this patient's stay.      Patient Name: Don Howard      Medical Record Number: [id number]  Date of Birth: 1927-01-20 Sex: Male         Room/Bed: 4W03C/4W03C-01 Payor Info: Payor: [employer]UE [employer] MEDICARE / Plan: BCBS MEDICARE / Product Type: *No Product type* /    Admit Date/Time:  01/17/2024  3:40 PM  Primary Diagnosis:  Debility  Hospital Problems: Principal Problem:   Debility Active Problems:   Cardiac arrest [facility])   Pulmonary embolus [facility])    Expected Discharge Date: Expected Discharge Date: 01/26/24  Team Members Present: Physician leading conference: Dr. Sven Elks Social Worker Present: Other (comment) Rilla Martina Gentry, SW) Nurse Present: Barnie Ronde, RN PT Present: Therisa Stains, PT OT Present: Lorrayne Fritter, OT PPS Coordinator present : Eleanor Colon, SLP     Current Status/Progress Goal Weekly Team Focus  Bowel/Bladder   Patient is continent of bowel and bladder. Patient had a smear of a bowel movement on 8/19. Stool softeners added but patient has yet to have a decent bowel movement. He is passing gas.   Remain continent of bowel and bladder. Due for a bowel movement.   Provide assistance to the bathroom as needed. Continue to administer stool softeners per [facility] and monitor for bowel movement.    Swallow/Nutrition/ Hydration               ADL's   Set up A UB, CGA LB and toileting   Mod I   Functional endurance, higher level cog, dynamic standing balance    Mobility   bed mobility supervision, transfers with rollator CGA, gait >1104ft with rollator CGA, 4 6in steps with 2 rails min A   Mod I  barriers: global weakness/deconditioning, R knee pain, decreased balance/coordination    Communication                 Safety/Cognition/ Behavioral Observations               Pain   Patient has reported some discomfort to his rib cage/sternum. Currently using lidocaine  patches for pain. He also has voltarin gel ordered for knee discomfort.   Maintain pain level less than 4   Assess for pain and provide interventions as needed.    Skin   Small skin tear to right forearm, healing. Small scab to left great toe, OTA.   Healing of skin tear and great toe.  Assess skin every shift and provide wound care as needed.      Discharge Planning:  Plans to discharge home alone with support from grandsons, Jerrell and Derrek, and intermittent support from extended family as needed. Awaiting DME and follow up recommendations from PT/OT.   Team Discussion: Patient admitted with debility and doing well physically but limited by hight level cognitive deficits.   Patient on target to meet rehab goals: yes, currently set up for upper body and CGA for lower body care and toileting. Needs CGA to manage steps but able to ambulate up to 150' using a rollator with supervison.  *See Care Plan and progress notes for long and short-term goals.   Revisions to Treatment Plan:  N/a   Teaching Needs: Safety, medications, transfers, toileting, etc.   Current  Barriers to Discharge: Decreased caregiver support  Possible Resolutions to Barriers: Family education HH follow up services DME: Rollator and TTB     Medical Summary Current Status: PE, right knee pain, cosntipation, insomnia  Barriers to Discharge: Medical stability  Barriers to Discharge Comments: PE, roght knee pain, constipation, insomnia Possible Resolutions to [employer] Focus: continue Eliquis , voltaren  gel ordered, continue senna/miralax , continue melatonin HS   Continued Need for Acute Rehabilitation Level of Care: The patient requires daily medical management by a physician with specialized training in physical medicine and  rehabilitation for the following reasons: Direction of a multidisciplinary physical rehabilitation program to maximize functional independence : Yes Medical management of patient stability for increased activity during participation in an intensive rehabilitation regime.: Yes Analysis of laboratory values and/or radiology reports with any subsequent need for medication adjustment and/or medical intervention. : Yes   I attest that I was present, lead the team conference, and concur with the assessment and plan of the team.   Fredericka Sober B 01/24/2024, 2:42 PM

## 2024-01-25 ENCOUNTER — Other Ambulatory Visit (HOSPITAL_COMMUNITY): Payer: Self-pay

## 2024-01-25 DIAGNOSIS — R5381 Other malaise: Secondary | ICD-10-CM | POA: Diagnosis not present

## 2024-01-25 LAB — BASIC METABOLIC PANEL WITH GFR
Anion gap: 11 (ref 5–15)
BUN: 23 mg/dL (ref 8–23)
CO2: 24 mmol/L (ref 22–32)
Calcium: 8.8 mg/dL — ABNORMAL LOW (ref 8.9–10.3)
Chloride: 103 mmol/L (ref 98–111)
Creatinine, Ser: 0.85 mg/dL (ref 0.61–1.24)
GFR, Estimated: >60 mL/min (ref >60)
Glucose, Bld: 87 mg/dL (ref 70–99)
Potassium: 4.1 mmol/L (ref 3.5–5.1)
Sodium: 138 mmol/L (ref 135–145)

## 2024-01-25 MED ORDER — DICLOFENAC SODIUM 1 % EX GEL
4.0000 g | Freq: Four times a day (QID) | CUTANEOUS | 0 refills | Status: AC
  Filled 2024-01-25: qty 300, 30d supply, fill #0

## 2024-01-25 MED ORDER — ACETAMINOPHEN 325 MG PO TABS: 325.0000 mg | ORAL_TABLET | ORAL | Status: AC | PRN

## 2024-01-25 MED ORDER — APIXABAN 5 MG PO TABS
5.0000 mg | ORAL_TABLET | Freq: Two times a day (BID) | ORAL | 0 refills | Status: AC
  Filled 2024-01-25: qty 60, 30d supply, fill #0

## 2024-01-25 MED ORDER — POLYETHYLENE GLYCOL 3350 17 GM/SCOOP PO POWD
17.0000 g | Freq: Two times a day (BID) | ORAL | 0 refills | Status: AC
  Filled 2024-01-25: qty 238, 7d supply, fill #0

## 2024-01-25 MED ORDER — MELATONIN 300 MCG PO TABS
1.5000 mg | ORAL_TABLET | Freq: Every evening | ORAL | 0 refills | Status: AC | PRN
  Filled 2024-01-25: qty 30, 6d supply, fill #0

## 2024-01-25 MED ORDER — SENNOSIDES-DOCUSATE SODIUM 8.6-50 MG PO TABS
1.0000 | ORAL_TABLET | Freq: Two times a day (BID) | ORAL | 0 refills | Status: AC
  Filled 2024-01-25: qty 60, 30d supply, fill #0

## 2024-01-25 MED ORDER — ASCORBIC ACID 1000 MG PO TABS
1000.0000 mg | ORAL_TABLET | Freq: Every day | ORAL | 0 refills | Status: AC
  Filled 2024-01-25: qty 100, 100d supply, fill #0

## 2024-01-25 MED ORDER — LIDOCAINE 5 % EX PTCH
1.0000 | MEDICATED_PATCH | CUTANEOUS | 0 refills | Status: AC
  Filled 2024-01-25: qty 30, 30d supply, fill #0

## 2024-01-25 NOTE — Progress Notes (Signed)
 Patient ID: Don Howard, male   DOB: 1926/12/01, 89 y.o.   MRN: [id number]  Have reviewed team conference with pt and family. Both aware and agreeable with targeted d/c date of 8/22 and goals of Independent with assistive device.

## 2024-01-25 NOTE — Progress Notes (Signed)
 Physical Therapy Discharge Summary  Patient Details  Name: MIKHAIL HALLENBECK MRN: [id number] Date of Birth: February 01, 1927  Date of Discharge from PT service:January 25, 2024  Patient has met 8 of 8 long term goals due to improved activity tolerance, improved balance, improved postural control, increased strength, ability to compensate for deficits, improved awareness, and improved coordination.  Patient to discharge at an ambulatory level Modified Independent using rollator.   Patient's care partner is independent to provide the necessary physical assistance at discharge.  All goals met  Recommendation:  Patient will benefit from ongoing skilled PT services in home health setting to continue to advance safe functional mobility, address ongoing impairments in transfers, generalized strengthening and endurance, dynamic standing balance/coordination, ambulation, stair navigation, and to minimize fall risk.  Equipment: No equipment provided - grandson ordering rollator  Reasons for discharge: treatment goals met  Patient/family agrees with progress made and goals achieved: Yes  PT Discharge Precautions/Restrictions Precautions Precautions: Fall Restrictions Weight Bearing Restrictions Per Provider Order: No Pain Interference Pain Interference Pain Effect on Sleep: 1. Rarely or not at all Pain Interference with Therapy Activities: 1. Rarely or not at all Pain Interference with Day-to-Day Activities: 1. Rarely or not at all Cognition Overall Cognitive Status: Within Functional Limits for tasks assessed Arousal/Alertness: Awake/alert Orientation Level: Oriented X4 Memory: Appears intact Awareness: Appears intact Problem Solving: Appears intact Safety/Judgment: Appears intact Sensation Sensation Light Touch: Appears Intact Hot/Cold: Not tested Proprioception: Appears Intact Stereognosis: Not tested Coordination Gross Motor Movements are Fluid and Coordinated: Yes Fine Motor Movements  are Fluid and Coordinated: No Finger Nose Finger Test: slow and mild tremors bilaterally Heel Shin Test: WFL bilaterally Motor  Motor Motor: Within Functional Limits  Mobility Bed Mobility Bed Mobility: Rolling Right;Rolling Left;Sit to Supine;Supine to Sit Rolling Right: Independent with assistive device Rolling Left: Independent with assistive device Supine to Sit: Independent with assistive device Sit to Supine: Independent with assistive device Transfers Transfers: Sit to Stand;Stand to Sit;Stand Pivot Transfers Sit to Stand: Independent with assistive device Stand to Sit: Independent with assistive device Stand Pivot Transfers: Independent with assistive device Transfer (Assistive device): Rollator Locomotion  Gait Ambulation: Yes Gait Assistance: Independent with assistive device Gait Distance (Feet): 150 Feet Assistive device: Rollator Gait Gait: Yes Gait Pattern: Impaired Gait Pattern: Decreased step length - right;Decreased step length - left;Poor foot clearance - left;Trunk flexed;Poor foot clearance - right;Narrow base of support;Step-through pattern Gait velocity: Decreased Stairs / Additional Locomotion Stairs: Yes Stair Management Technique: Two rails;Alternating pattern;Forwards Number of Stairs: 12 Height of Stairs: 6 Ramp: Independent with assistive device (rollator) Curb: Supervision/Verbal cueing (rollator) Pick up small object from the floor assist level: Independent with assistive device Pick up small object from the floor assistive device: reacher and rollator Wheelchair Mobility Wheelchair Mobility: No  Trunk/Postural Assessment  Cervical Assessment Cervical Assessment: Exceptions to [facility] (forward head) Thoracic Assessment Thoracic Assessment: Exceptions to [facility] (rounded shoulders) Lumbar Assessment Lumbar Assessment: Exceptions to [facility] (posterior pelvic tilt) Postural Control Postural Control: Within Functional Limits  Balance Balance Balance  Assessed: Yes Static Sitting Balance Static Sitting - Balance Support: Feet supported;No upper extremity supported Static Sitting - Level of Assistance: 7: Independent Dynamic Sitting Balance Dynamic Sitting - Balance Support: Feet supported;No upper extremity supported Dynamic Sitting - Level of Assistance: 6: Modified independent (Device/Increase time) Static Standing Balance Static Standing - Balance Support: Bilateral upper extremity supported;During functional activity (rollator) Static Standing - Level of Assistance: 6: Modified independent (Device/Increase time) Dynamic Standing Balance Dynamic Standing -  Balance Support: Bilateral upper extremity supported;During functional activity (rollator) Dynamic Standing - Level of Assistance: 6: Modified independent (Device/Increase time) Dynamic Standing - Comments: with transfers and gait Extremity Assessment  RLE Assessment RLE Assessment: Exceptions to [facility] General Strength Comments: tested sitting EOB RLE Strength Right Hip Flexion: 4-/5 Right Hip ABduction: 4-/5 Right Hip ADduction: 4-/5 Right Knee Flexion: 4-/5 Right Knee Extension: 3+/5 Right Ankle Dorsiflexion: 4/5 Right Ankle Plantar Flexion: 4/5 LLE Assessment LLE Assessment: Exceptions to [facility] General Strength Comments: tested sitting EOB LLE Strength Left Hip Flexion: 4-/5 Left Hip ABduction: 4-/5 Left Hip ADduction: 4-/5 Left Knee Flexion: 4-/5 Left Knee Extension: 4-/5 Left Ankle Dorsiflexion: 4/5 Left Ankle Plantar Flexion: 4/5   Therisa HERO Zaunegger Therisa Stains PT, DPT 01/25/2024, 7:06 AM

## 2024-01-25 NOTE — Progress Notes (Signed)
 Occupational Therapy Discharge Summary  Patient Details  Name: Don Howard MRN: [id number] Date of Birth: [birth year]  Date of Discharge from OT service:January 25, 2024    Patient has met 8 of 8 long term goals due to improved activity tolerance, improved balance, and improved awareness.  Patient to discharge at overall Modified Independent level. Patient is ambulatory with rollator, bathes at shower level, Mod-I. Patient's care partner is independent to provide the necessary physical assistance at discharge.  Family education completed with patients grandson.   Recommendation:  Patient will benefit from ongoing skilled OT services in home health setting to continue to advance functional skills in the area of BADL and iADL.  Equipment: Tub Advertising copywriter and BSC recommended by OT for BADL, family has ordered.   Reasons for discharge: treatment goals met and discharge from hospital  Patient/family agrees with progress made and goals achieved: Yes  OT Discharge Precautions/Restrictions  Precautions Precautions: Fall;None Precaution/Restrictions Comments: n/a Restrictions Weight Bearing Restrictions Per Provider Order: No    Vital Signs Therapy Vitals Temp: 97.7 F (36.5 C) Temp Source: Oral Pulse Rate: (!) 53 Resp: 18 BP: 125/60 Patient Position (if appropriate): Lying Oxygen Therapy SpO2: 96 % O2 Device: Room Air Pain  No reports ADL ADL Eating: Independent Where Assessed-Eating: Edge of bed Grooming: Independent Upper Body Bathing: Independent Where Assessed-Upper Body Bathing: Chair Lower Body Bathing: Independent Where Assessed-Lower Body Bathing: Shower Upper Body Dressing: Independent Where Assessed-Upper Body Dressing: Edge of bed Lower Body Dressing: Independent Where Assessed-Lower Body Dressing: Edge of bed Toileting: Modified independent Where Assessed-Toileting: Neurosurgeon Method:  Proofreader: Drop arm bedside commode Tub/Shower Transfer: Modified independent Web designer Method: Ship broker: Emergency planning/management officer, Acupuncturist: Modified independent Film/video editor Method: Designer, industrial/product: Information systems manager with back Vision Baseline Vision/History: 0 No visual deficits Patient Visual Report: No change from baseline Vision Assessment?: No apparent visual deficits Perception  Perception: Within Functional Limits Praxis Praxis: WFL Cognition Cognition Overall Cognitive Status: Within Functional Limits for tasks assessed Arousal/Alertness: Awake/alert Orientation Level: Person;Place;Situation Memory: Appears intact Attention: Sustained Sustained Attention: Appears intact Awareness: Appears intact Problem Solving: Appears intact Safety/Judgment: Appears intact Brief Interview for Mental Status (BIMS) Repetition of Three Words (First Attempt): 3 Temporal Orientation: Year: Correct Temporal Orientation: Month: Accurate within 5 days Temporal Orientation: Day: Correct Recall: Sock: Yes, no cue required Recall: Blue: Yes, no cue required Recall: Bed: Yes, no cue required BIMS Summary Score: 15 Sensation Sensation Light Touch: Appears Intact Hot/Cold: Not tested Proprioception: Appears Intact Stereognosis: Not tested Coordination Gross Motor Movements are Fluid and Coordinated: Yes Fine Motor Movements are Fluid and Coordinated: No Finger Nose Finger Test: slow and mild tremors bilaterally Heel Shin Test: WFL bilaterally Motor  Motor Motor: Within Functional Limits Mobility  Bed Mobility Bed Mobility: Rolling Right;Rolling Left;Sit to Supine;Supine to Sit Rolling Right: Independent with assistive device Rolling Left: Independent with assistive device Supine to Sit: Independent with assistive device Sit to Supine: Independent with assistive  device Transfers Sit to Stand: Independent with assistive device Stand to Sit: Independent with assistive device  Trunk/Postural Assessment  Cervical Assessment Cervical Assessment: Exceptions to [facility] (forward head) Thoracic Assessment Thoracic Assessment: Exceptions to [facility] (rounded shoulders) Lumbar Assessment Lumbar Assessment: Exceptions to [facility] (posterior pelvic tilt) Postural Control Postural Control: Within Functional Limits  Balance Balance Balance Assessed: Yes Static Sitting Balance Static Sitting - Balance Support: Feet supported;No upper extremity supported Static  Sitting - Level of Assistance: 7: Independent Dynamic Sitting Balance Dynamic Sitting - Balance Support: Feet supported;No upper extremity supported Dynamic Sitting - Level of Assistance: 6: Modified independent (Device/Increase time) Static Standing Balance Static Standing - Balance Support: Bilateral upper extremity supported;During functional activity (rollator) Static Standing - Level of Assistance: 6: Modified independent (Device/Increase time) Dynamic Standing Balance Dynamic Standing - Balance Support: Bilateral upper extremity supported;During functional activity (rollator) Dynamic Standing - Level of Assistance: 6: Modified independent (Device/Increase time) Dynamic Standing - Comments: with transfers and gait    Natelie Ostrosky Woods-Chance, MS, OTR/L 01/25/2024, 8:09 AM

## 2024-01-25 NOTE — Progress Notes (Signed)
 Inpatient Rehabilitation Discharge Medication Review by a Pharmacist  A complete drug regimen review was completed for this patient to identify any potential clinically significant medication issues.  High Risk Drug Classes Is patient taking? Indication by Medication  Antipsychotic No   Anticoagulant Yes Apixaban  - PE  Antibiotic No   Opioid No   Antiplatelet No   Hypoglycemics/insulin No   Vasoactive Medication No   Chemotherapy No   Other Yes Acetaminophen , diclofenac  topical gel, lidocaine  patch - pain Melatonin - sleep Vitamin C /E/B complex - supplement     Type of Medication Issue Identified Description of Issue Recommendation(s)  Drug Interaction(s) (clinically significant)     Duplicate Therapy     Allergy     No Medication Administration End Date     Incorrect Dose     Additional Drug Therapy Needed     Significant med changes from prior encounter (inform family/care partners about these prior to discharge).    Other  Amlodipine  on hold Re-eval outpatient     Clinically significant medication issues were identified that warrant physician communication and completion of prescribed/recommended actions by midnight of the next day:   No  Name of provider notified for urgent issues identified:   Provider Method of Notification:    Pharmacist comments:   Time spent performing this drug regimen review (minutes):  [address], PharmD, [place], AAHIVP, CPP Infectious Disease Pharmacist 01/25/2024 10:39 AM

## 2024-01-25 NOTE — Progress Notes (Signed)
 PROGRESS NOTE   Subjective/Complaints: No new complaints this morning He is sleeping better with melatonin and would like to have this prn at home as well, discussed with PA  ROS: Denies shortness of breath, abdominal pain, nausea, vomiting  +right knee pain- improved with knee sleeve, +post exercise muscle soreness- improved, insomnia improved   Objective:   No results found. No results for input(s): WBC, HGB, HCT, PLT in the last 72 hours.   Recent Labs    01/25/24 0527  NA 138  K 4.1  CL 103  CO2 24  GLUCOSE 87  BUN 23  CREATININE 0.85  CALCIUM  8.8*     Intake/Output Summary (Last 24 hours) at 01/25/2024 1218 Last data filed at 01/25/2024 0827 Gross per 24 hour  Intake 820 ml  Output 2025 ml  Net -1205 ml        Physical Exam: Vital Signs Blood pressure 125/60, pulse (!) 53, temperature 97.7 F (36.5 C), temperature source Oral, resp. rate 18, height 5' 10 (1.778 m), weight 60.1 kg, SpO2 96%. Gen: no distress, normal appearing HEENT: oral mucosa pink and moist, NCAT Cardio: Bradycardia Chest: CTAB, normal effort, normal rate of breathing Abd: soft, non-distended, positive bowel sounds Ext: no edema Psych: pleasant, normal affect Skin: intact Mental Status: AAOx4, memory intact, normal insight and awareness Speech/Languate: No speech or language deficits noted, follows simple commands CRANIAL NERVES: II: PERRL. Visual fields full III, IV, VI: EOM intact, no gaze preference or deviation V: normal sensation bilaterally VII: no asymmetry VIII: hard of hearing  IX, X: normal palatal elevation XI: 5/5 head turn and 5/5 shoulder shrug bilaterally XII: Tongue midline     MOTOR: RUE: 5/5 Deltoid, 5/5 Biceps, 5/5 Triceps,5/5 Grip LUE: 5/5 Deltoid, 5/5 Biceps, 5/5 Triceps, 5/5 Grip RLE: HF 4-/5, KE 4/5, ADF 4+/5, APF 4+/5 LLE: HF 4/5, KE 4+/5, ADF 4+/5, APF 4+/5 Stable 8/21    SENSORY: Normal to touch all 4 extremities   Coordination: Normal finger to nose b/l   No abnormal tone noted   MSK: R knee pain with ROM, atrophy intrinsic muscles of b/l hands  Assessment/Plan: 1. Functional deficits which require 3+ hours per day of interdisciplinary therapy in a comprehensive inpatient rehab setting. Physiatrist is providing close team supervision and 24 hour management of active medical problems listed below. Physiatrist and rehab team continue to assess barriers to discharge/monitor patient progress toward functional and medical goals  Care Tool:  Bathing    Body parts bathed by patient: Right arm, Left arm, Chest, Abdomen, Front perineal area, Buttocks, Right upper leg, Left upper leg, Right lower leg, Left lower leg         Bathing assist Assist Level: Independent with assistive device Assistive Device Comment: Tub Transfer Bench   Upper Body Dressing/Undressing Upper body dressing   What is the patient wearing?: Button up shirt    Upper body assist Assist Level: Independent    Lower Body Dressing/Undressing Lower body dressing      What is the patient wearing?: Pants     Lower body assist Assist for lower body dressing: Independent     Toileting Toileting    Toileting assist Assist  for toileting: Independent with assistive device Assistive Device Comment: Urinal, BSC   Transfers Chair/bed transfer  Transfers assist     Chair/bed transfer assist level: Independent with assistive device Chair/bed transfer assistive device: Other (rollator)   Locomotion Ambulation   Ambulation assist      Assist level: Independent with assistive device Assistive device: Rollator Max distance: 136ft   Walk 10 feet activity   Assist     Assist level: Independent with assistive device Assistive device: Rollator   Walk 50 feet activity   Assist Walk 50 feet with 2 turns activity did not occur: Safety/medical concerns (fatigue, knee  pain)  Assist level: Independent with assistive device Assistive device: Rollator    Walk 150 feet activity   Assist Walk 150 feet activity did not occur: Safety/medical concerns (fatigue, knee pain)  Assist level: Independent with assistive device Assistive device: Rollator    Walk 10 feet on uneven surface  activity   Assist Walk 10 feet on uneven surfaces activity did not occur: Safety/medical concerns (fatigue, knee pain)   Assist level: Independent with assistive device Assistive device: Rollator   Wheelchair     Assist Is the patient using a wheelchair?: No Type of Wheelchair: Manual Wheelchair activity did not occur: N/A  Wheelchair assist level: Dependent - Patient 0% Max wheelchair distance: 171ft    Wheelchair 50 feet with 2 turns activity    Assist    Wheelchair 50 feet with 2 turns activity did not occur: N/A   Assist Level: Dependent - Patient 0%   Wheelchair 150 feet activity     Assist  Wheelchair 150 feet activity did not occur: N/A   Assist Level: Dependent - Patient 0%   Blood pressure 125/60, pulse (!) 53, temperature 97.7 F (36.5 C), temperature source Oral, resp. rate 18, height 5' 10 (1.778 m), weight 60.1 kg, SpO2 96%.    Medical Problem List and Plan: 1. Functional deficits secondary to debility after large volume pulmonary embolism S/P TPA with cardiogenic shock and transient PEA arrest             -patient may shower             -ELOS/Goals: PT/OT mod I, 7 to 9 days            Continue CIR  Set d/c date for 8/22  2.  Antithrombotics: -DVT/anticoagulation:  Mechanical: Sequential compression devices, below knee Bilateral lower extremities Pharmaceutical: continue Eliquis , plan for lifelong anticoagulation             -antiplatelet therapy: n/a  3. Pain Management: continue Tylenol  as needed  4. Mood/Behavior/Sleep: LCSW to follow for evaluation and support when available.              -antipsychotic agents:  n/a  5. Neuropsych/cognition: This patient is capable of making decisions on his own behalf.  6. HTN: BP is normal- d/c amlodipine   - 8/18 well-controlled overall continue to monitor    01/25/2024    4:48 AM 01/24/2024    8:13 PM 01/24/2024    3:57 PM  Vitals with BMI  Systolic 125 134 892  Diastolic 60 69 60  Pulse 53 58 61    7. Hyperglycemia: well controlled, d/c CBG checks.  8.  Acute hypoxic respiratory failure due to PE: continue Eliquis  5 mg BID.  9.  Transaminitis-improving felt to be likely due to hypotension continue to monitor  10.  AKI: Cr reviewed and has normalized  - 8/18 Creatinine stable  at 0.82/BUN 13  11.  Hyponatremia: Mild NA <132<134   - 8/18 sodium up to 137  12.  RV strain r/t PE: Will need repeat echocardiogram as outpatient  13.  R knee pain: voltaren  gel, knee sleeve ordered,continue these  14.  Anemia: Hgb reviewed and is steadily improving, d/c CBC checks  15.  BPH?  Previously on saw palmetto, likely best to hold off on this due to Eliquis .  Denies having significant symptoms at this time.  - 8/18 patient has been continent of bladder for the past couple days  16. Lip burn: medihoney ordered, continue  LOS: 8 days A FACE TO FACE EVALUATION WAS PERFORMED  Sven SQUIBB Telissa Palmisano 01/25/2024, 12:18 PM

## 2024-01-25 NOTE — Progress Notes (Signed)
 Occupational Therapy Session Note  Patient Details  Name: Don Howard MRN: [id number] Date of Birth: June 20, 1926  Today's Date: 01/25/2024 OT Individual Time: 9169-9054 OT Individual Time Calculation (min): 75 min    Short Term Goals: Week 1:  OT Short Term Goal 1 (Week 1): Pt will perform toileting tasks mod I (sit to stand) OT Short Term Goal 2 (Week 1): Pt will obtain clothing from drawers/ closet with LRAD mod I OT Short Term Goal 3 (Week 1): Pt will perform simple meal prep in kitchen with supervision with LRAD in standing  Skilled Therapeutic Interventions/Progress  Skilled OT session completed to address ADL retraining, functional mobility, and IADL education. Pt received lying in bed eating, agreeable to participate in therapy. Pt reports 4/10 pain in RLE voltarin gel applied, nursing notified.  Pt performed UB/LB dressing and footwear at EOB with independence, stands interminenttly during task with one hand on bed rail to keep balance. Ambulated with rollator Mod I to sink in room to simulate home environment for oral care. Edu provided on alternative positions to place rollator when standing at sink to ensure safety when performing task within the home. Pt displayed understanding by performing preferred movement with rollator in lateral position when standing in front of sink.   Ambulated +150 ft Mod I with rollator. Seated in recliner for rest d/t knee pain. Pt displayed teach back method for transfers within bathroom environment in the home and reports grandson has ordered needed equipment to perform transfers safely. Ambulated recliner>tub transfer bench Mod I with rollator. Displayed carryover from previous session by placing shower current under bottom to prevent risk for falls in bathroom environment. Ambulated to kitchen required VC d/t not locking rollator before sitting. Edu provided on rollator use at all times within the home until HHPT/OT follows up with reccomendations for  equipment. Return demo'd stove and oven use Mod I in standing with rollator.   Ambulated 37ft Mod I to therapy mat. HEP issues for UB strengthening with red theraband. Pt displayed understanding of HEP by demo with VC as needed throughout task. Pt handed off to PT, seated at therapy mat with all needs met.  Therapy Documentation Precautions:  Precautions Precautions: Fall Restrictions Weight Bearing Restrictions Per Provider Order: No    Therapy/Group: Individual Therapy  Tacoya Altizer Woods-Chance, MS, OTR/L 01/25/2024, 7:42 AM

## 2024-01-25 NOTE — Progress Notes (Signed)
 Physical Therapy Session Note  Patient Details  Name: Don Howard MRN: [id number] Date of Birth: 04/10/1927  Today's Date: 01/25/2024 PT Individual Time: 9054-8942 and 8585-8545 PT Individual Time Calculation (min): 72 min and 40 min  Short Term Goals: Week 1:  PT Short Term Goal 1 (Week 1): pt will perform all transfers with LRAD and CGA PT Short Term Goal 2 (Week 1): pt will ambulate 24ft with LRAD and CGA PT Short Term Goal 3 (Week 1): pt will navigate 4 6in steps with 1 HR and CGA  Skilled Therapeutic Interventions/Progress Updates:   Treatment Session 1 Received pt in main gym - handoff from OT. Pt agreeable to PT treatment and denied any pain during session. Pt reported feeling worn out rating fatigue 8/10 and required frequent extended rest breaks throughout session. Session with emphasis on discharge planning, functional mobility/transfers, generalized strengthening and endurance, dynamic standing balance/coordination, and ambulation. Pt performed all transfers with rollator and mod I throughout session.   Pt ambulated to staircase and navigated 12 6in steps with bilateral handrails and supervision ascending and descending with a step through pattern - reporting mild R knee pain afterwards. Pt then navigated 1 8in curb with rollator and close supervision to simulate home entry - cues to keep brakes locked. Pt performed the following activities on Biodex with emphasis on dynamic standing balance/coordination, proprioception, and reaction time (limited by fatigue): - weight shift training on level static with BUE support with min A fading to supervision for 2 minutes - limits of stability training on level static with BUE support for 1 minute and 25 seconds x 1 and 2 minutes x 1  Pt performed lunges on Bosu with BUE support x10 in // bars with emphasis on LE strength/endurance, then transitioned to the following seated exercises due to fatigue: -knee extension with 1.5lb ankle  weight 2x10 bilaterally -hip flexion with 1.5lb ankle weight 2x10 bilaterally -hip abduction with red TB 2x15 -bicep curls with 5lb dowel 2x12 -lateral trunk rotations with 4.4lb medicine ball 2x10 bilaterally Pt ambulated 174ft with rollator and mod I back to room and requested to return to bed. Transferred into supine mod I and concluded session with pt semi-reclined in bed, needs within reach, and bed alarm on.   Treatment Session 2 Received pt semi-reclined in bed, pt agreeable to PT treatment, and denied any pain during session. Session with emphasis on discharge planning, functional mobility/transfers, generalized strengthening and endurance, dynamic standing balance/coordination, and ambulation. Pt transferred semi-reclined<>sitting L EOB with HOB elevated and mod I. Donned shoes sitting EOB with setup assist and performed all transfers with rollator and mod I throughout session.   Pt ambulated 11ft x 2 trials with rollator and mod I to/from dayroom. Pt performed seated BUE/BLE strengthening on Nustep at workload 3 increasing to 4 for 8 minutes on Pace Partner - total of 556 steps, 0.3 miles, 67 SPM, and 1.9 METs.  Worked on dynamic standing balance/coordination dribbling ball for 1 minute x 3 trials with CGA for balance - pt able to pick up ball from floor multiple times with CGA for balance. Returned to room and requested to return to bed - transferred into supine mod I and concluded session with pt semi-reclined in bed, needs within reach, and bed alarm on.   Therapy Documentation Precautions:  Precautions Precautions: Fall Restrictions Weight Bearing Restrictions Per Provider Order: No  Therapy/Group: Individual Therapy Therisa HERO Zaunegger Therisa Stains PT, DPT 01/25/2024, 7:02 AM

## 2024-01-26 ENCOUNTER — Other Ambulatory Visit (HOSPITAL_COMMUNITY): Payer: Self-pay

## 2024-01-26 DIAGNOSIS — I700 Atherosclerosis of aorta: Secondary | ICD-10-CM | POA: Insufficient documentation

## 2024-01-26 DIAGNOSIS — I10 Essential (primary) hypertension: Secondary | ICD-10-CM | POA: Diagnosis present

## 2024-01-26 DIAGNOSIS — K5900 Constipation, unspecified: Secondary | ICD-10-CM | POA: Insufficient documentation

## 2024-01-26 DIAGNOSIS — N400 Enlarged prostate without lower urinary tract symptoms: Secondary | ICD-10-CM | POA: Diagnosis present

## 2024-01-26 NOTE — Progress Notes (Signed)
 Inpatient Rehabilitation Care Coordinator Discharge Note   Patient Details  Name: Don Howard MRN: [id number] Date of Birth: Sep 07, 1926   Discharge location: Home alone with daily support from grandsons Tribbey and Derrek as well as extended family  Length of Stay: 9 days  Discharge activity level: Independent with assistive device  Home/community participation: Can interact with the community  Patient response un:Yzjouy Literacy - How often do you need to have someone help you when you read instructions, pamphlets, or other written material from your doctor or pharmacy?: Sometimes  Patient response un:Dnrpjo Isolation - How often do you feel lonely or isolated from those around you?: Never  Services provided included: MD, RD, PT, OT, SLP, RN, CM, TR, Pharmacy, SW  Financial Services:  Financial Services Utilized: Medicare    Choices offered to/list presented to:    Follow-up services arranged:  Home Health, Patient/Family has no preference for HH/DME agencies Home Health Agency: [facility] [phone]         Patient response to transportation need: Is the patient able to respond to transportation needs?: Yes In the past 12 months, has lack of transportation kept you from medical appointments or from getting medications?: No In the past 12 months, has lack of transportation kept you from meetings, work, or from getting things needed for daily living?: No   Patient/Family verbalized understanding of follow-up arrangements:  Yes  Individual responsible for coordination of the follow-up plan: Patient on board with follow-up plan  Confirmed correct DME delivered: [employer]  Loreli 01/26/2024    Comments (or additional information):[facility] to follow for [facility] PT/OT needs [phone]  Summary of Stay    Date/Time Discharge Planning CSW  01/23/24 1107 Plans to discharge home alone with support from grandsons, [place]alt and Derrek, and intermittent support from extended  family as needed. Awaiting DME and follow up recommendations from PT/OT. DS       [employer]  Loreli

## 2024-01-26 NOTE — Progress Notes (Signed)
 PROGRESS NOTE   Subjective/Complaints: No new complaints Ready for d/c today Has completed Eliquis  assistance form, asked SW to please assist with faxing  ROS: Denies shortness of breath, abdominal pain, nausea, vomiting  +right knee pain- improved with knee sleeve, +post exercise muscle soreness- improved, insomnia improved   Objective:   No results found. No results for input(s): WBC, HGB, HCT, PLT in the last 72 hours.   Recent Labs    01/25/24 0527  NA 138  K 4.1  CL 103  CO2 24  GLUCOSE 87  BUN 23  CREATININE 0.85  CALCIUM  8.8*     Intake/Output Summary (Last 24 hours) at 01/26/2024 1010 Last data filed at 01/26/2024 0754 Gross per 24 hour  Intake 680 ml  Output 1150 ml  Net -470 ml        Physical Exam: Vital Signs Blood pressure 109/61, pulse (!) 52, temperature 97.6 F (36.4 C), temperature source Oral, resp. rate 18, height 5' 10 (1.778 m), weight 60.1 kg, SpO2 95%. Gen: no distress, normal appearing HEENT: oral mucosa pink and moist, NCAT Cardio: Bradycardia Chest: CTAB, normal effort, normal rate of breathing Abd: soft, non-distended, positive bowel sounds Ext: no edema Psych: pleasant, normal affect Skin: intact Mental Status: AAOx4, memory intact, normal insight and awareness Speech/Languate: No speech or language deficits noted, follows simple commands CRANIAL NERVES: II: PERRL. Visual fields full III, IV, VI: EOM intact, no gaze preference or deviation V: normal sensation bilaterally VII: no asymmetry VIII: hard of hearing  IX, X: normal palatal elevation XI: 5/5 head turn and 5/5 shoulder shrug bilaterally XII: Tongue midline     MOTOR: RUE: 5/5 Deltoid, 5/5 Biceps, 5/5 Triceps,5/5 Grip LUE: 5/5 Deltoid, 5/5 Biceps, 5/5 Triceps, 5/5 Grip RLE: HF 4-/5, KE 4/5, ADF 4+/5, APF 4+/5 LLE: HF 4/5, KE 4+/5, ADF 4+/5, APF 4+/5 Stable 8/22   SENSORY: Normal to touch all 4  extremities   Coordination: Normal finger to nose b/l   No abnormal tone noted   MSK: R knee pain with ROM, atrophy intrinsic muscles of b/l hands  Assessment/Plan: 1. Functional deficits which require 3+ hours per day of interdisciplinary therapy in a comprehensive inpatient rehab setting. Physiatrist is providing close team supervision and 24 hour management of active medical problems listed below. Physiatrist and rehab team continue to assess barriers to discharge/monitor patient progress toward functional and medical goals  Care Tool:  Bathing    Body parts bathed by patient: Right arm, Left arm, Chest, Abdomen, Front perineal area, Buttocks, Right upper leg, Left upper leg, Right lower leg, Left lower leg         Bathing assist Assist Level: Independent with assistive device Assistive Device Comment: Tub Transfer Bench   Upper Body Dressing/Undressing Upper body dressing   What is the patient wearing?: Button up shirt    Upper body assist Assist Level: Independent    Lower Body Dressing/Undressing Lower body dressing      What is the patient wearing?: Pants     Lower body assist Assist for lower body dressing: Independent     Toileting Toileting    Toileting assist Assist for toileting: Independent with assistive device  Assistive Device Comment: Urinal, BSC   Transfers Chair/bed transfer  Transfers assist     Chair/bed transfer assist level: Independent with assistive device Chair/bed transfer assistive device: Other (rollator)   Locomotion Ambulation   Ambulation assist      Assist level: Independent with assistive device Assistive device: Rollator Max distance: 171ft   Walk 10 feet activity   Assist     Assist level: Independent with assistive device Assistive device: Rollator   Walk 50 feet activity   Assist Walk 50 feet with 2 turns activity did not occur: Safety/medical concerns (fatigue, knee pain)  Assist level: Independent  with assistive device Assistive device: Rollator    Walk 150 feet activity   Assist Walk 150 feet activity did not occur: Safety/medical concerns (fatigue, knee pain)  Assist level: Independent with assistive device Assistive device: Rollator    Walk 10 feet on uneven surface  activity   Assist Walk 10 feet on uneven surfaces activity did not occur: Safety/medical concerns (fatigue, knee pain)   Assist level: Independent with assistive device Assistive device: Rollator   Wheelchair     Assist Is the patient using a wheelchair?: No Type of Wheelchair: Manual Wheelchair activity did not occur: N/A  Wheelchair assist level: Dependent - Patient 0% Max wheelchair distance: 145ft    Wheelchair 50 feet with 2 turns activity    Assist    Wheelchair 50 feet with 2 turns activity did not occur: N/A   Assist Level: Dependent - Patient 0%   Wheelchair 150 feet activity     Assist  Wheelchair 150 feet activity did not occur: N/A   Assist Level: Dependent - Patient 0%   Blood pressure 109/61, pulse (!) 52, temperature 97.6 F (36.4 C), temperature source Oral, resp. rate 18, height 5' 10 (1.778 m), weight 60.1 kg, SpO2 95%.    Medical Problem List and Plan: 1. Functional deficits secondary to debility after large volume pulmonary embolism S/P TPA with cardiogenic shock and transient PEA arrest             -patient may shower             -ELOS/Goals: PT/OT mod I, 7 to 9 days           Continue CIR  Set d/c date for 8/22  2.  Antithrombotics: -DVT/anticoagulation:  Mechanical: Sequential compression devices, below knee Bilateral lower extremities Pharmaceutical: continue Eliquis , plan for lifelong anticoagulation, asked SW to please assist with faxing the financial assistance for for Eliquis              -antiplatelet therapy: n/a  3. Pain Management: continue Tylenol  as needed  4. Mood/Behavior/Sleep: LCSW to follow for evaluation and support when  available.              -antipsychotic agents: n/a  5. Neuropsych/cognition: This patient is capable of making decisions on his own behalf.  6. HTN: BP is normal- d/c amlodipine   - 8/18 well-controlled overall continue to monitor    01/26/2024    4:25 AM 01/25/2024    7:37 PM 01/25/2024    1:50 PM  Vitals with BMI  Systolic 109 120 883  Diastolic 61 68 61  Pulse 52 61 59    7. Hyperglycemia: well controlled, d/c CBG checks.  8.  Acute hypoxic respiratory failure due to PE: continue Eliquis  5 mg BID.  9.  Transaminitis-improving felt to be likely due to hypotension continue to monitor  10.  AKI: Cr reviewed and has  normalized  - 8/18 Creatinine stable at 0.82/BUN 13  11.  Hyponatremia: Mild NA <132<134   - 8/18 sodium up to 137  12.  RV strain r/t PE: Will need repeat echocardiogram as outpatient  13.  R knee pain: voltaren  gel, knee sleeve ordered,continue these  14.  Anemia: Hgb reviewed and is steadily improving, d/c CBC checks  15.  BPH?  Previously on saw palmetto, likely best to hold off on this due to Eliquis .  Denies having significant symptoms at this time.  - 8/18 patient has been continent of bladder for the past couple days  16. Lip burn: medihoney ordered, conitnue   >30 minutes spent in discharge of patient including review of medications and follow-up appointments, physical examination, and in answering all patient's questions   LOS: 9 days A FACE TO FACE EVALUATION WAS PERFORMED  Aslee Such P Anaelle Dunton 01/26/2024, 10:10 AM

## 2024-01-29 ENCOUNTER — Ambulatory Visit (INDEPENDENT_AMBULATORY_CARE_PROVIDER_SITE_OTHER)

## 2024-01-29 ENCOUNTER — Ambulatory Visit: Attending: Physician Assistant | Admitting: Physician Assistant

## 2024-01-29 ENCOUNTER — Encounter: Payer: Self-pay | Admitting: Physician Assistant

## 2024-01-29 VITALS — BP 121/69 | HR 44 | Ht 70.0 in | Wt 144.0 lb

## 2024-01-29 DIAGNOSIS — R531 Weakness: Secondary | ICD-10-CM

## 2024-01-29 DIAGNOSIS — I493 Ventricular premature depolarization: Secondary | ICD-10-CM

## 2024-01-29 DIAGNOSIS — I10 Essential (primary) hypertension: Secondary | ICD-10-CM

## 2024-01-29 DIAGNOSIS — R5383 Other fatigue: Secondary | ICD-10-CM

## 2024-01-29 DIAGNOSIS — I2699 Other pulmonary embolism without acute cor pulmonale: Secondary | ICD-10-CM

## 2024-01-29 DIAGNOSIS — A419 Sepsis, unspecified organism: Secondary | ICD-10-CM

## 2024-01-29 NOTE — Progress Notes (Signed)
 Office Visit    Patient Name: Don Howard Date of Encounter: 01/29/2024  PCP:  Kip Righter, MD   [facility]  Cardiologist:  Maude Emmer, MD  Advanced Practice Provider:  No care team member to display Electrophysiologist:  None   HPI    Don Howard is a 89 y.o. male with a past medical history of hypertension presents today for follow-up appointment.  I saw the patient back in April of last year.  He tells me that he was seen at the first week of February with a respiratory infection.  He was having some fatigue and weakness at that time.  He was started on antibiotic and took that for about 2 and after a few days experienced some vertigo which eventually subsided.  He tells me that he stays tired all the time now and he is only able to do yard work for about 15 or 20 minutes without needing to take a break.  No shortness of breath, chest pain, or lower extremity swelling.  He was told to eat more protein so he has been trying to drink Ensure and eat peanut butter and eggs.  His appetite is not back to normal.  It looks like the last time he was seen in the office December 2022 he was also having some fatigue at that time and echocardiogram was reviewed from 9/21 with EF 55 to 60%, mild AR and trivial MR.  Reports no shortness of breath nor dyspnea on exertion. Reports no chest pain, pressure, or tightness. No edema, orthopnea, PND. Reports no palpitations.   He was recently seen in the hospital for altered mental status and sepsis.  He also had acute hypoxic respiratory failure due to a PE and was given half a dose of tPA, was on heparin , and was started on Eliquis .  No clear provoking factor for the PE.  Aspirin  was discontinued at this time.  Plan was to repeat echocardiogram outpatient.  Today, he presents for follow-up after recent hospitalization for a pulmonary embolism and infection.  He is accompanied by his grandson, [name].  He was hospitalized  for a pulmonary embolism and sepsis with AMS. He is on Eliquis  5 mg twice daily for anticoagulation. No current fever, chills, or breathing difficulties. During his past episode he required CPR and defibrillation in the field. PEA arrest was noted. He reports soreness above his ribcage, causing pain when sneezing or coughing from CPR.  He continues to feel fatigued and is unable to work as he used to. He has a history of PVCs, which have increased in frequency over the past two years, potentially contributing to his fatigue. 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  Reports no shortness of breath nor dyspnea on exertion. Reports no chest pain, pressure, or tightness outside of soreness from CPR.  No edema, orthopnea, PND. Reports no palpitations.   Discussed the use of AI scribe software for clinical note transcription with the patient, who gave verbal consent to proceed.   Past Medical History    Past Medical History:  Diagnosis Date   Hypertension    Past Surgical History:  Procedure Laterality Date   APPENDECTOMY     TONSILLECTOMY      Allergies  No Known Allergies  EKGs/Labs/Other Studies Reviewed:   The following studies were reviewed today:  Echocardiogram 02/05/2020 IMPRESSIONS     1. Left ventricular ejection fraction, by estimation,  is 55 to 60%. The  left ventricle has normal function. The left ventricle has no regional  wall motion abnormalities. Left ventricular diastolic parameters are  consistent with age-related delayed  relaxation (normal).   2. Right ventricular systolic function is normal. The right ventricular  size is normal. There is severely elevated pulmonary artery systolic  pressure.   3. Left atrial size was moderately dilated.   4. The mitral valve is normal in structure. Trivial mitral valve  regurgitation. No evidence of mitral stenosis.   5. The aortic valve is tricuspid. Aortic valve  regurgitation is mild.  Mild to moderate aortic valve sclerosis/calcification is present, without  any evidence of aortic stenosis.   6. The inferior vena cava is normal in size with greater than 50%  respiratory variability, suggesting right atrial pressure of 3 mmHg.   FINDINGS   Left Ventricle: Left ventricular ejection fraction, by estimation, is 55  to 60%. The left ventricle has normal function. The left ventricle has no  regional wall motion abnormalities. The left ventricular internal cavity  size was normal in size. There is   no left ventricular hypertrophy. Left ventricular diastolic parameters  are consistent with age-related delayed relaxation (normal).   Right Ventricle: The right ventricular size is normal. No increase in  right ventricular wall thickness. Right ventricular systolic function is  normal. There is severely elevated pulmonary artery systolic pressure. The  tricuspid regurgitant velocity is  6.27 m/s, and with an assumed right atrial pressure of 3 mmHg, the  estimated right ventricular systolic pressure is 160.3 mmHg.   Left Atrium: Left atrial size was moderately dilated.   Right Atrium: Right atrial size was normal in size.   Pericardium: There is no evidence of pericardial effusion.   Mitral Valve: The mitral valve is normal in structure. There is mild  thickening of the mitral valve leaflet(s). There is mild calcification of  the mitral valve leaflet(s). Normal mobility of the mitral valve leaflets.  Trivial mitral valve regurgitation.  No evidence of mitral valve stenosis.   Tricuspid Valve: The tricuspid valve is normal in structure. Tricuspid  valve regurgitation is mild . No evidence of tricuspid stenosis.   Aortic Valve: The aortic valve is tricuspid. Aortic valve regurgitation is  mild. Aortic regurgitation PHT measures 725 msec. Mild to moderate aortic  valve sclerosis/calcification is present, without any evidence of aortic  stenosis.    Pulmonic Valve: The pulmonic valve was normal in structure. Pulmonic valve  regurgitation is not visualized. No evidence of pulmonic stenosis.   Aorta: The aortic root is normal in size and structure.   Venous: The inferior vena cava is normal in size with greater than 50%  respiratory variability, suggesting right atrial pressure of 3 mmHg.   IAS/Shunts: No atrial level shunt detected by color flow Doppler.       EKG:  EKG is  ordered today.  The ekg ordered today demonstrates sinus bradycardia rate 54 bpm, incomplete LBBB  Recent Labs: 01/12/2024: Magnesium  1.8 01/18/2024: ALT 48 01/22/2024: Hemoglobin 11.7; Platelets 447 01/25/2024: BUN 23; Creatinine, Ser 0.85; Potassium 4.1; Sodium 138  Recent Lipid Panel    Component Value Date/Time   CHOL 110 08/31/2017 0207   TRIG 29 08/31/2017 0207   HDL 55 08/31/2017 0207   CHOLHDL 2.0 08/31/2017 0207   VLDL 6 08/31/2017 0207   LDLCALC 49 08/31/2017 0207    Home Medications   Current Meds  Medication Sig   acetaminophen  (TYLENOL ) 325 MG tablet Take  1-2 tablets (325-650 mg total) by mouth every 4 (four) hours as needed for mild pain (pain score 1-3).   [Paused] amLODipine  (NORVASC ) 5 MG tablet Take 1 tablet (5 mg total) by mouth daily.   apixaban  (ELIQUIS ) 5 MG TABS tablet Take 1 tablet (5 mg total) by mouth 2 (two) times daily.   ascorbic acid  (VITAMIN C ) 1000 MG tablet Take 1 tablet (1,000 mg total) by mouth daily.   B Complex-C (SUPER B COMPLEX PO) Take 1 tablet by mouth daily.   diclofenac  Sodium (VOLTAREN ) 1 % GEL Apply 4 g topically 4 (four) times daily.   lidocaine  (LIDODERM ) 5 % Place 1 patch onto the skin daily. Remove & Discard patch within 12 hours or as directed by MD   Melatonin 300 MCG TABS Take 5 tablets (1,500 mcg total) by mouth at bedtime as needed.   polyethylene glycol powder (GLYCOLAX /MIRALAX ) 17 GM/SCOOP powder Dissolve 1 capful (17 g) in liquid as directed and take by mouth 2 (two) times daily.    senna-docusate (SENOKOT-S) 8.6-50 MG tablet Take 1 tablet by mouth 2 (two) times daily.   VITAMIN E PO Take 1 tablet by mouth daily.     Review of Systems      All other systems reviewed and are otherwise negative except as noted above.  Physical Exam    VS:  BP 121/69   Pulse (!) 44   Ht 5' 10 (1.778 m)   Wt 144 lb (65.3 kg)   SpO2 96%   BMI 20.66 kg/m  , BMI Body mass index is 20.66 kg/m.  Wt Readings from Last 3 Encounters:  01/29/24 144 lb (65.3 kg)  01/17/24 132 lb 7.9 oz (60.1 kg)  01/17/24 145 lb 1 oz (65.8 kg)     GEN: Well nourished, well developed, in no acute distress. HEENT: normal. Neck: Supple, no JVD, carotid bruits, or masses. Cardiac: RRR with frequent PVCs, no murmurs, rubs, or gallops. No clubbing, cyanosis, edema.  Radials/PT 2+ and equal bilaterally.  Respiratory:  Respirations regular and unlabored, clear to auscultation bilaterally. GI: Soft, nontender, nondistended. MS: No deformity or atrophy. Skin: Warm and dry, no rash. Neuro:  Strength and sensation are intact. Psych: Normal affect.  Assessment & Plan    Pulmonary embolism, status post hospitalization, on lifelong anticoagulation Recent PE hospitalization with unknown etiology. Lifelong anticoagulation with Eliquis  to prevent recurrence.  - Continue Eliquis  5 mg twice daily. - Schedule echocardiogram to rule out cardiac source of embolism. - Advise follow-up with primary care for potential CT scan to reassess lungs.  Premature ventricular contractions with increased frequency Increased PVC frequency on recent EKG. Potential contributor to fatigue and reduced exercise tolerance. Agreed to 14-day Zio patch monitoring. - Apply Zio patch for 14-day cardiac monitoring to assess PVC frequency and potential arrhythmias. - Review results of Zio patch monitoring upon completion.  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 Previous echocardiogram  showed mild valve leakage, no immediate intervention required. - Schedule echocardiogram to reassess valvular function and compare with previous results.  Anemia, mild and likely chronic Mild anemia with hemoglobin at 11.7, likely chronic. No acute symptoms. Hemoglobin levels expected to improve over time.  Osteoarthritis of the knee with history of effusion (R) Chronic knee osteoarthritis with effusion. Using Voltaren  gel for symptom management. Prefers conservative management, avoiding surgical interventions. - Continue Voltaren  gel as needed. - Advise follow-up with primary care if knee swelling occurs. - Discuss conservative management options with  primary care, avoiding surgical interventions unless necessary.  Fatigue and reduced exercise tolerance, multifactorial (post-RSV and cardiac) Fatigue and reduced exercise tolerance likely multifactorial, related to post-RSV recovery and cardiac issues including PVCs. Discussed potential impact of PVCs on fatigue. - Encourage gradual increase in activity as tolerated. - Consider potential treatment for PVCs if significant on Zio patch results.   Disposition: Follow up 6 months with Maude Emmer, MD or APP.  Signed, Orren LOISE Fabry, PA-C 01/29/2024, 4:10 PM [facility]

## 2024-01-29 NOTE — Progress Notes (Unsigned)
 Applied a 14 day Zio XT monitor to patient in the office  Nishan to read

## 2024-01-29 NOTE — Patient Instructions (Signed)
 Medication Instructions:  Your physician recommends that you continue on your current medications as directed. Please refer to the Current Medication list given to you today.  *If you need a refill on your cardiac medications before your next appointment, please call your pharmacy*   Testing/Procedures:Your physician has requested that you have an echocardiogram. Echocardiography is a painless test that uses sound waves to create images of your heart. It provides your doctor with information about the size and shape of your heart and how well your heart's chambers and valves are working. This procedure takes approximately one hour. There are no restrictions for this procedure. Please do NOT wear cologne, perfume, aftershave, or lotions (deodorant is allowed). Please arrive 15 minutes prior to your appointment time.  Please note: We ask at that you not bring children with you during ultrasound (echo/ vascular) testing. Due to room size and safety concerns, children are not allowed in the ultrasound rooms during exams. Our front office staff cannot provide observation of children in our lobby area while testing is being conducted. An adult accompanying a patient to their appointment will only be allowed in the ultrasound room at the discretion of the ultrasound technician under special circumstances. We apologize for any inconvenience.  Follow-Up: At [facility], you and your health needs are our priority.  As part of our continuing mission to provide you with exceptional heart care, our providers are all part of one team.  This team includes your primary Cardiologist (physician) and Advanced Practice Providers or APPs (Physician Assistants and Nurse Practitioners) who all work together to provide you with the care you need, when you need it.  Your next appointment:   6 month(s)  Provider:   Maude Emmer, MD    We recommend signing up for the patient portal called MyChart.  Sign up  information is provided on this After Visit Summary.  MyChart is used to connect with patients for Virtual Visits (Telemedicine).  Patients are able to view lab/test results, encounter notes, upcoming appointments, etc.  Non-urgent messages can be sent to your provider as well.   To learn more about what you can do with MyChart, go to [email or web address].   Other Instructions ZIO XT- Long Term Monitor Instructions  Your physician has requested you wear a ZIO patch monitor for 14 days.  This is a single patch monitor. [employer] supplies one patch monitor per enrollment. Additional stickers are not available. Please do not apply patch if you will be having a Nuclear Stress Test,  Echocardiogram, Cardiac CT, MRI, or Chest Xray during the period you would be wearing the  monitor. The patch cannot be worn during these tests. You cannot remove and re-apply the  ZIO XT patch monitor.  Your ZIO patch monitor will be mailed 3 day USPS to your address on file. It may take 3-5 days  to receive your monitor after you have been enrolled.  Once you have received your monitor, please review the enclosed instructions. Your monitor  has already been registered assigning a specific monitor serial # to you.  Billing and Patient Assistance Program Information  We have supplied [employer] with any of your insurance information on file for billing purposes. [employer] offers a sliding scale Patient Assistance Program for patients that do not have  insurance, or whose insurance does not completely cover the cost of the ZIO monitor.  You must apply for the Patient Assistance Program to qualify for this discounted rate.  To apply, please call [employer] at  [phone], select option 4, select option 2, ask to apply for  Patient Assistance Program. Meredeth will ask your household income, and how many people  are in your household. They will quote your out-of-pocket cost based on that information.  [employer] will also be  able to set up a 55-month, interest-free payment plan if needed.  Applying the monitor   Shave hair from upper left chest.  Hold abrader disc by orange tab. Rub abrader in 40 strokes over the upper left chest as  indicated in your monitor instructions.  Clean area with 4 enclosed alcohol pads. Let dry.  Apply patch as indicated in monitor instructions. Patch will be placed under collarbone on left  side of chest with arrow pointing upward.  Rub patch adhesive wings for 2 minutes. Remove white label marked 1. Remove the white  label marked 2. Rub patch adhesive wings for 2 additional minutes.  While looking in a mirror, press and release button in center of patch. A small green light will  flash 3-4 times. This will be your only indicator that the monitor has been turned on.  Do not shower for the first 24 hours. You may shower after the first 24 hours.  Press the button if you feel a symptom. You will hear a small click. Record Date, Time and  Symptom in the Patient Logbook.  When you are ready to remove the patch, follow instructions on the last 2 pages of Patient  Logbook. Stick patch monitor onto the last page of Patient Logbook.  Place Patient Logbook in the blue and white box. Use locking tab on box and tape box closed  securely. The blue and white box has prepaid postage on it. Please place it in the mailbox as  soon as possible. Your physician should have your test results approximately 7 days after the  monitor has been mailed back to [facility]  Call [facility] Customer Care at [phone] if you have questions regarding  your ZIO XT patch monitor. Call them immediately if you see an orange light blinking on your  monitor.  If your monitor falls off in less than 4 days, contact our Monitor department at [phone].  If your monitor becomes loose or falls off after 4 days call [employer] at [phone] for  suggestions on securing your monitor

## 2024-01-30 DIAGNOSIS — Z7901 Long term (current) use of anticoagulants: Secondary | ICD-10-CM | POA: Diagnosis not present

## 2024-01-30 DIAGNOSIS — M1711 Unilateral primary osteoarthritis, right knee: Secondary | ICD-10-CM | POA: Diagnosis not present

## 2024-01-30 DIAGNOSIS — N179 Acute kidney failure, unspecified: Secondary | ICD-10-CM | POA: Diagnosis not present

## 2024-01-30 DIAGNOSIS — Z8673 Personal history of transient ischemic attack (TIA), and cerebral infarction without residual deficits: Secondary | ICD-10-CM | POA: Diagnosis not present

## 2024-01-30 DIAGNOSIS — H9193 Unspecified hearing loss, bilateral: Secondary | ICD-10-CM | POA: Diagnosis not present

## 2024-01-30 DIAGNOSIS — I10 Essential (primary) hypertension: Secondary | ICD-10-CM | POA: Diagnosis not present

## 2024-01-30 DIAGNOSIS — Z791 Long term (current) use of non-steroidal anti-inflammatories (NSAID): Secondary | ICD-10-CM | POA: Diagnosis not present

## 2024-01-30 DIAGNOSIS — J90 Pleural effusion, not elsewhere classified: Secondary | ICD-10-CM | POA: Diagnosis not present

## 2024-01-30 DIAGNOSIS — D649 Anemia, unspecified: Secondary | ICD-10-CM | POA: Diagnosis not present

## 2024-01-30 DIAGNOSIS — J9601 Acute respiratory failure with hypoxia: Secondary | ICD-10-CM | POA: Diagnosis not present

## 2024-01-30 DIAGNOSIS — I469 Cardiac arrest, cause unspecified: Secondary | ICD-10-CM | POA: Diagnosis not present

## 2024-01-30 DIAGNOSIS — J189 Pneumonia, unspecified organism: Secondary | ICD-10-CM | POA: Diagnosis not present

## 2024-01-30 DIAGNOSIS — E871 Hypo-osmolality and hyponatremia: Secondary | ICD-10-CM | POA: Diagnosis not present

## 2024-01-30 DIAGNOSIS — N400 Enlarged prostate without lower urinary tract symptoms: Secondary | ICD-10-CM | POA: Diagnosis not present

## 2024-01-30 DIAGNOSIS — I2510 Atherosclerotic heart disease of native coronary artery without angina pectoris: Secondary | ICD-10-CM | POA: Diagnosis not present

## 2024-01-30 DIAGNOSIS — I2699 Other pulmonary embolism without acute cor pulmonale: Secondary | ICD-10-CM | POA: Diagnosis not present

## 2024-01-30 DIAGNOSIS — Z9049 Acquired absence of other specified parts of digestive tract: Secondary | ICD-10-CM | POA: Diagnosis not present

## 2024-01-30 DIAGNOSIS — G479 Sleep disorder, unspecified: Secondary | ICD-10-CM | POA: Diagnosis not present

## 2024-01-31 DIAGNOSIS — J9601 Acute respiratory failure with hypoxia: Secondary | ICD-10-CM | POA: Diagnosis not present

## 2024-01-31 DIAGNOSIS — H9193 Unspecified hearing loss, bilateral: Secondary | ICD-10-CM | POA: Diagnosis not present

## 2024-01-31 DIAGNOSIS — G479 Sleep disorder, unspecified: Secondary | ICD-10-CM | POA: Diagnosis not present

## 2024-01-31 DIAGNOSIS — N400 Enlarged prostate without lower urinary tract symptoms: Secondary | ICD-10-CM | POA: Diagnosis not present

## 2024-01-31 DIAGNOSIS — E871 Hypo-osmolality and hyponatremia: Secondary | ICD-10-CM | POA: Diagnosis not present

## 2024-01-31 DIAGNOSIS — D649 Anemia, unspecified: Secondary | ICD-10-CM | POA: Diagnosis not present

## 2024-01-31 DIAGNOSIS — Z7901 Long term (current) use of anticoagulants: Secondary | ICD-10-CM | POA: Diagnosis not present

## 2024-01-31 DIAGNOSIS — Z8673 Personal history of transient ischemic attack (TIA), and cerebral infarction without residual deficits: Secondary | ICD-10-CM | POA: Diagnosis not present

## 2024-01-31 DIAGNOSIS — I469 Cardiac arrest, cause unspecified: Secondary | ICD-10-CM | POA: Diagnosis not present

## 2024-01-31 DIAGNOSIS — J90 Pleural effusion, not elsewhere classified: Secondary | ICD-10-CM | POA: Diagnosis not present

## 2024-01-31 DIAGNOSIS — I10 Essential (primary) hypertension: Secondary | ICD-10-CM | POA: Diagnosis not present

## 2024-01-31 DIAGNOSIS — Z9049 Acquired absence of other specified parts of digestive tract: Secondary | ICD-10-CM | POA: Diagnosis not present

## 2024-01-31 DIAGNOSIS — N179 Acute kidney failure, unspecified: Secondary | ICD-10-CM | POA: Diagnosis not present

## 2024-01-31 DIAGNOSIS — M1711 Unilateral primary osteoarthritis, right knee: Secondary | ICD-10-CM | POA: Diagnosis not present

## 2024-01-31 DIAGNOSIS — I2699 Other pulmonary embolism without acute cor pulmonale: Secondary | ICD-10-CM | POA: Diagnosis not present

## 2024-01-31 DIAGNOSIS — J189 Pneumonia, unspecified organism: Secondary | ICD-10-CM | POA: Diagnosis not present

## 2024-01-31 DIAGNOSIS — I2510 Atherosclerotic heart disease of native coronary artery without angina pectoris: Secondary | ICD-10-CM | POA: Diagnosis not present

## 2024-01-31 DIAGNOSIS — Z791 Long term (current) use of non-steroidal anti-inflammatories (NSAID): Secondary | ICD-10-CM | POA: Diagnosis not present

## 2024-02-01 ENCOUNTER — Telehealth: Payer: Self-pay | Admitting: Cardiovascular Disease

## 2024-02-01 NOTE — Telephone Encounter (Signed)
 Yes, heart rate is in the 40s typically.  Not on any nodal agents.  Also could be reading inaccurately on pulse ox because he has frequent PVCs.  I ordered a monitor for this reason.   If he is asymptomatic I do not see a reason to discontinue his rehab at this time.  I am happy to place what ever order is needed for him to continue physical therapy.  It is important for him to continue strengthening his muscles and work on his balance at his age.    Thanks! Don LOISE Fabry, PA-C  _____________________________________________________  [place] and relayed this information to [name] with home [facility] therapy.  He is pleased to hear and this is all that he needed.

## 2024-02-01 NOTE — Telephone Encounter (Signed)
 Patient last office visit  heart rate 44 .  Signe the home health PT.called to inform  cardiology  of patient heart activiy during visit. Per Jim,heart rate was record at 38 bts a couple days ago by OT. Patient has no symptoms.    Signe states, per [facility]  therapy protocol - they need an order to  continue  therapy if  heart rate  is below 60. documented.   RN informed Signe , this message will need to  sent  to T. Conte PA-C for parameters for therapy.   Signe verbalized understanding and waiting an answer

## 2024-02-01 NOTE — Telephone Encounter (Signed)
 STAT if HR is under 50 or over 120  (normal HR is 60-100 beats per minute)  What is your heart rate? 44  Do you have a log of your heart rate readings (document readings)? No   Do you have any other symptoms? Pt therapist calling to report this pts HR from yesterday but the pt didn't have any symptoms and would just like to make sure that's normal for the pt.

## 2024-02-08 DIAGNOSIS — R54 Age-related physical debility: Secondary | ICD-10-CM | POA: Diagnosis not present

## 2024-02-08 DIAGNOSIS — N289 Disorder of kidney and ureter, unspecified: Secondary | ICD-10-CM | POA: Diagnosis not present

## 2024-02-08 DIAGNOSIS — R5381 Other malaise: Secondary | ICD-10-CM | POA: Diagnosis not present

## 2024-02-08 DIAGNOSIS — D649 Anemia, unspecified: Secondary | ICD-10-CM | POA: Diagnosis not present

## 2024-02-08 DIAGNOSIS — R5383 Other fatigue: Secondary | ICD-10-CM | POA: Diagnosis not present

## 2024-02-08 DIAGNOSIS — I493 Ventricular premature depolarization: Secondary | ICD-10-CM | POA: Diagnosis not present

## 2024-02-08 DIAGNOSIS — R001 Bradycardia, unspecified: Secondary | ICD-10-CM | POA: Diagnosis not present

## 2024-02-08 DIAGNOSIS — I2699 Other pulmonary embolism without acute cor pulmonale: Secondary | ICD-10-CM | POA: Diagnosis not present

## 2024-02-09 DIAGNOSIS — N400 Enlarged prostate without lower urinary tract symptoms: Secondary | ICD-10-CM | POA: Diagnosis not present

## 2024-02-09 DIAGNOSIS — Z7901 Long term (current) use of anticoagulants: Secondary | ICD-10-CM | POA: Diagnosis not present

## 2024-02-09 DIAGNOSIS — I10 Essential (primary) hypertension: Secondary | ICD-10-CM | POA: Diagnosis not present

## 2024-02-09 DIAGNOSIS — Z8673 Personal history of transient ischemic attack (TIA), and cerebral infarction without residual deficits: Secondary | ICD-10-CM | POA: Diagnosis not present

## 2024-02-09 DIAGNOSIS — D649 Anemia, unspecified: Secondary | ICD-10-CM | POA: Diagnosis not present

## 2024-02-09 DIAGNOSIS — M1711 Unilateral primary osteoarthritis, right knee: Secondary | ICD-10-CM | POA: Diagnosis not present

## 2024-02-09 DIAGNOSIS — J9601 Acute respiratory failure with hypoxia: Secondary | ICD-10-CM | POA: Diagnosis not present

## 2024-02-09 DIAGNOSIS — I2699 Other pulmonary embolism without acute cor pulmonale: Secondary | ICD-10-CM | POA: Diagnosis not present

## 2024-02-09 DIAGNOSIS — G479 Sleep disorder, unspecified: Secondary | ICD-10-CM | POA: Diagnosis not present

## 2024-02-09 DIAGNOSIS — Z791 Long term (current) use of non-steroidal anti-inflammatories (NSAID): Secondary | ICD-10-CM | POA: Diagnosis not present

## 2024-02-09 DIAGNOSIS — I469 Cardiac arrest, cause unspecified: Secondary | ICD-10-CM | POA: Diagnosis not present

## 2024-02-09 DIAGNOSIS — N179 Acute kidney failure, unspecified: Secondary | ICD-10-CM | POA: Diagnosis not present

## 2024-02-09 DIAGNOSIS — J90 Pleural effusion, not elsewhere classified: Secondary | ICD-10-CM | POA: Diagnosis not present

## 2024-02-09 DIAGNOSIS — E871 Hypo-osmolality and hyponatremia: Secondary | ICD-10-CM | POA: Diagnosis not present

## 2024-02-09 DIAGNOSIS — H9193 Unspecified hearing loss, bilateral: Secondary | ICD-10-CM | POA: Diagnosis not present

## 2024-02-09 DIAGNOSIS — Z9049 Acquired absence of other specified parts of digestive tract: Secondary | ICD-10-CM | POA: Diagnosis not present

## 2024-02-09 DIAGNOSIS — I2510 Atherosclerotic heart disease of native coronary artery without angina pectoris: Secondary | ICD-10-CM | POA: Diagnosis not present

## 2024-02-09 DIAGNOSIS — J189 Pneumonia, unspecified organism: Secondary | ICD-10-CM | POA: Diagnosis not present

## 2024-02-12 DIAGNOSIS — N400 Enlarged prostate without lower urinary tract symptoms: Secondary | ICD-10-CM | POA: Diagnosis not present

## 2024-02-12 DIAGNOSIS — J9601 Acute respiratory failure with hypoxia: Secondary | ICD-10-CM | POA: Diagnosis not present

## 2024-02-12 DIAGNOSIS — H9193 Unspecified hearing loss, bilateral: Secondary | ICD-10-CM | POA: Diagnosis not present

## 2024-02-12 DIAGNOSIS — N179 Acute kidney failure, unspecified: Secondary | ICD-10-CM | POA: Diagnosis not present

## 2024-02-12 DIAGNOSIS — E871 Hypo-osmolality and hyponatremia: Secondary | ICD-10-CM | POA: Diagnosis not present

## 2024-02-12 DIAGNOSIS — I10 Essential (primary) hypertension: Secondary | ICD-10-CM | POA: Diagnosis not present

## 2024-02-12 DIAGNOSIS — Z8673 Personal history of transient ischemic attack (TIA), and cerebral infarction without residual deficits: Secondary | ICD-10-CM | POA: Diagnosis not present

## 2024-02-12 DIAGNOSIS — Z7901 Long term (current) use of anticoagulants: Secondary | ICD-10-CM | POA: Diagnosis not present

## 2024-02-12 DIAGNOSIS — M1711 Unilateral primary osteoarthritis, right knee: Secondary | ICD-10-CM | POA: Diagnosis not present

## 2024-02-12 DIAGNOSIS — I2510 Atherosclerotic heart disease of native coronary artery without angina pectoris: Secondary | ICD-10-CM | POA: Diagnosis not present

## 2024-02-12 DIAGNOSIS — G479 Sleep disorder, unspecified: Secondary | ICD-10-CM | POA: Diagnosis not present

## 2024-02-12 DIAGNOSIS — Z791 Long term (current) use of non-steroidal anti-inflammatories (NSAID): Secondary | ICD-10-CM | POA: Diagnosis not present

## 2024-02-12 DIAGNOSIS — I469 Cardiac arrest, cause unspecified: Secondary | ICD-10-CM | POA: Diagnosis not present

## 2024-02-12 DIAGNOSIS — I2699 Other pulmonary embolism without acute cor pulmonale: Secondary | ICD-10-CM | POA: Diagnosis not present

## 2024-02-12 DIAGNOSIS — J90 Pleural effusion, not elsewhere classified: Secondary | ICD-10-CM | POA: Diagnosis not present

## 2024-02-12 DIAGNOSIS — J189 Pneumonia, unspecified organism: Secondary | ICD-10-CM | POA: Diagnosis not present

## 2024-02-12 DIAGNOSIS — Z9049 Acquired absence of other specified parts of digestive tract: Secondary | ICD-10-CM | POA: Diagnosis not present

## 2024-02-12 DIAGNOSIS — D649 Anemia, unspecified: Secondary | ICD-10-CM | POA: Diagnosis not present

## 2024-02-15 DIAGNOSIS — J189 Pneumonia, unspecified organism: Secondary | ICD-10-CM | POA: Diagnosis not present

## 2024-02-15 DIAGNOSIS — Z9049 Acquired absence of other specified parts of digestive tract: Secondary | ICD-10-CM | POA: Diagnosis not present

## 2024-02-15 DIAGNOSIS — N179 Acute kidney failure, unspecified: Secondary | ICD-10-CM | POA: Diagnosis not present

## 2024-02-15 DIAGNOSIS — I2510 Atherosclerotic heart disease of native coronary artery without angina pectoris: Secondary | ICD-10-CM | POA: Diagnosis not present

## 2024-02-15 DIAGNOSIS — G479 Sleep disorder, unspecified: Secondary | ICD-10-CM | POA: Diagnosis not present

## 2024-02-15 DIAGNOSIS — I2699 Other pulmonary embolism without acute cor pulmonale: Secondary | ICD-10-CM | POA: Diagnosis not present

## 2024-02-15 DIAGNOSIS — Z8673 Personal history of transient ischemic attack (TIA), and cerebral infarction without residual deficits: Secondary | ICD-10-CM | POA: Diagnosis not present

## 2024-02-15 DIAGNOSIS — J9601 Acute respiratory failure with hypoxia: Secondary | ICD-10-CM | POA: Diagnosis not present

## 2024-02-15 DIAGNOSIS — D649 Anemia, unspecified: Secondary | ICD-10-CM | POA: Diagnosis not present

## 2024-02-15 DIAGNOSIS — N400 Enlarged prostate without lower urinary tract symptoms: Secondary | ICD-10-CM | POA: Diagnosis not present

## 2024-02-15 DIAGNOSIS — J90 Pleural effusion, not elsewhere classified: Secondary | ICD-10-CM | POA: Diagnosis not present

## 2024-02-15 DIAGNOSIS — E871 Hypo-osmolality and hyponatremia: Secondary | ICD-10-CM | POA: Diagnosis not present

## 2024-02-15 DIAGNOSIS — Z7901 Long term (current) use of anticoagulants: Secondary | ICD-10-CM | POA: Diagnosis not present

## 2024-02-15 DIAGNOSIS — I10 Essential (primary) hypertension: Secondary | ICD-10-CM | POA: Diagnosis not present

## 2024-02-15 DIAGNOSIS — Z791 Long term (current) use of non-steroidal anti-inflammatories (NSAID): Secondary | ICD-10-CM | POA: Diagnosis not present

## 2024-02-15 DIAGNOSIS — H9193 Unspecified hearing loss, bilateral: Secondary | ICD-10-CM | POA: Diagnosis not present

## 2024-02-15 DIAGNOSIS — I469 Cardiac arrest, cause unspecified: Secondary | ICD-10-CM | POA: Diagnosis not present

## 2024-02-15 DIAGNOSIS — M1711 Unilateral primary osteoarthritis, right knee: Secondary | ICD-10-CM | POA: Diagnosis not present

## 2024-02-19 DIAGNOSIS — Z9049 Acquired absence of other specified parts of digestive tract: Secondary | ICD-10-CM | POA: Diagnosis not present

## 2024-02-19 DIAGNOSIS — Z791 Long term (current) use of non-steroidal anti-inflammatories (NSAID): Secondary | ICD-10-CM | POA: Diagnosis not present

## 2024-02-19 DIAGNOSIS — I2699 Other pulmonary embolism without acute cor pulmonale: Secondary | ICD-10-CM | POA: Diagnosis not present

## 2024-02-19 DIAGNOSIS — I10 Essential (primary) hypertension: Secondary | ICD-10-CM | POA: Diagnosis not present

## 2024-02-19 DIAGNOSIS — D649 Anemia, unspecified: Secondary | ICD-10-CM | POA: Diagnosis not present

## 2024-02-19 DIAGNOSIS — E871 Hypo-osmolality and hyponatremia: Secondary | ICD-10-CM | POA: Diagnosis not present

## 2024-02-19 DIAGNOSIS — Z8673 Personal history of transient ischemic attack (TIA), and cerebral infarction without residual deficits: Secondary | ICD-10-CM | POA: Diagnosis not present

## 2024-02-19 DIAGNOSIS — G479 Sleep disorder, unspecified: Secondary | ICD-10-CM | POA: Diagnosis not present

## 2024-02-19 DIAGNOSIS — Z7901 Long term (current) use of anticoagulants: Secondary | ICD-10-CM | POA: Diagnosis not present

## 2024-02-19 DIAGNOSIS — N400 Enlarged prostate without lower urinary tract symptoms: Secondary | ICD-10-CM | POA: Diagnosis not present

## 2024-02-19 DIAGNOSIS — I2510 Atherosclerotic heart disease of native coronary artery without angina pectoris: Secondary | ICD-10-CM | POA: Diagnosis not present

## 2024-02-19 DIAGNOSIS — N179 Acute kidney failure, unspecified: Secondary | ICD-10-CM | POA: Diagnosis not present

## 2024-02-19 DIAGNOSIS — H9193 Unspecified hearing loss, bilateral: Secondary | ICD-10-CM | POA: Diagnosis not present

## 2024-02-19 DIAGNOSIS — I469 Cardiac arrest, cause unspecified: Secondary | ICD-10-CM | POA: Diagnosis not present

## 2024-02-19 DIAGNOSIS — J9601 Acute respiratory failure with hypoxia: Secondary | ICD-10-CM | POA: Diagnosis not present

## 2024-02-19 DIAGNOSIS — M1711 Unilateral primary osteoarthritis, right knee: Secondary | ICD-10-CM | POA: Diagnosis not present

## 2024-02-19 DIAGNOSIS — J189 Pneumonia, unspecified organism: Secondary | ICD-10-CM | POA: Diagnosis not present

## 2024-02-19 DIAGNOSIS — J90 Pleural effusion, not elsewhere classified: Secondary | ICD-10-CM | POA: Diagnosis not present

## 2024-02-22 DIAGNOSIS — J90 Pleural effusion, not elsewhere classified: Secondary | ICD-10-CM | POA: Diagnosis not present

## 2024-02-22 DIAGNOSIS — I2699 Other pulmonary embolism without acute cor pulmonale: Secondary | ICD-10-CM | POA: Diagnosis not present

## 2024-02-22 DIAGNOSIS — N400 Enlarged prostate without lower urinary tract symptoms: Secondary | ICD-10-CM | POA: Diagnosis not present

## 2024-02-22 DIAGNOSIS — I10 Essential (primary) hypertension: Secondary | ICD-10-CM | POA: Diagnosis not present

## 2024-02-22 DIAGNOSIS — Z791 Long term (current) use of non-steroidal anti-inflammatories (NSAID): Secondary | ICD-10-CM | POA: Diagnosis not present

## 2024-02-22 DIAGNOSIS — Z7901 Long term (current) use of anticoagulants: Secondary | ICD-10-CM | POA: Diagnosis not present

## 2024-02-22 DIAGNOSIS — N179 Acute kidney failure, unspecified: Secondary | ICD-10-CM | POA: Diagnosis not present

## 2024-02-22 DIAGNOSIS — G479 Sleep disorder, unspecified: Secondary | ICD-10-CM | POA: Diagnosis not present

## 2024-02-22 DIAGNOSIS — H9193 Unspecified hearing loss, bilateral: Secondary | ICD-10-CM | POA: Diagnosis not present

## 2024-02-22 DIAGNOSIS — Z9049 Acquired absence of other specified parts of digestive tract: Secondary | ICD-10-CM | POA: Diagnosis not present

## 2024-02-22 DIAGNOSIS — J189 Pneumonia, unspecified organism: Secondary | ICD-10-CM | POA: Diagnosis not present

## 2024-02-22 DIAGNOSIS — I469 Cardiac arrest, cause unspecified: Secondary | ICD-10-CM | POA: Diagnosis not present

## 2024-02-22 DIAGNOSIS — D649 Anemia, unspecified: Secondary | ICD-10-CM | POA: Diagnosis not present

## 2024-02-22 DIAGNOSIS — M1711 Unilateral primary osteoarthritis, right knee: Secondary | ICD-10-CM | POA: Diagnosis not present

## 2024-02-22 DIAGNOSIS — I2510 Atherosclerotic heart disease of native coronary artery without angina pectoris: Secondary | ICD-10-CM | POA: Diagnosis not present

## 2024-02-22 DIAGNOSIS — E871 Hypo-osmolality and hyponatremia: Secondary | ICD-10-CM | POA: Diagnosis not present

## 2024-02-22 DIAGNOSIS — J9601 Acute respiratory failure with hypoxia: Secondary | ICD-10-CM | POA: Diagnosis not present

## 2024-02-22 DIAGNOSIS — Z8673 Personal history of transient ischemic attack (TIA), and cerebral infarction without residual deficits: Secondary | ICD-10-CM | POA: Diagnosis not present

## 2024-02-27 DIAGNOSIS — J9601 Acute respiratory failure with hypoxia: Secondary | ICD-10-CM | POA: Diagnosis not present

## 2024-02-27 DIAGNOSIS — Z7901 Long term (current) use of anticoagulants: Secondary | ICD-10-CM | POA: Diagnosis not present

## 2024-02-27 DIAGNOSIS — Z791 Long term (current) use of non-steroidal anti-inflammatories (NSAID): Secondary | ICD-10-CM | POA: Diagnosis not present

## 2024-02-27 DIAGNOSIS — M1711 Unilateral primary osteoarthritis, right knee: Secondary | ICD-10-CM | POA: Diagnosis not present

## 2024-02-27 DIAGNOSIS — I469 Cardiac arrest, cause unspecified: Secondary | ICD-10-CM | POA: Diagnosis not present

## 2024-02-27 DIAGNOSIS — G479 Sleep disorder, unspecified: Secondary | ICD-10-CM | POA: Diagnosis not present

## 2024-02-27 DIAGNOSIS — J90 Pleural effusion, not elsewhere classified: Secondary | ICD-10-CM | POA: Diagnosis not present

## 2024-02-27 DIAGNOSIS — D649 Anemia, unspecified: Secondary | ICD-10-CM | POA: Diagnosis not present

## 2024-02-27 DIAGNOSIS — I2699 Other pulmonary embolism without acute cor pulmonale: Secondary | ICD-10-CM | POA: Diagnosis not present

## 2024-02-27 DIAGNOSIS — Z9049 Acquired absence of other specified parts of digestive tract: Secondary | ICD-10-CM | POA: Diagnosis not present

## 2024-02-27 DIAGNOSIS — Z8673 Personal history of transient ischemic attack (TIA), and cerebral infarction without residual deficits: Secondary | ICD-10-CM | POA: Diagnosis not present

## 2024-02-27 DIAGNOSIS — I10 Essential (primary) hypertension: Secondary | ICD-10-CM | POA: Diagnosis not present

## 2024-02-27 DIAGNOSIS — I2510 Atherosclerotic heart disease of native coronary artery without angina pectoris: Secondary | ICD-10-CM | POA: Diagnosis not present

## 2024-02-27 DIAGNOSIS — N400 Enlarged prostate without lower urinary tract symptoms: Secondary | ICD-10-CM | POA: Diagnosis not present

## 2024-02-27 DIAGNOSIS — J189 Pneumonia, unspecified organism: Secondary | ICD-10-CM | POA: Diagnosis not present

## 2024-02-27 DIAGNOSIS — N179 Acute kidney failure, unspecified: Secondary | ICD-10-CM | POA: Diagnosis not present

## 2024-02-27 DIAGNOSIS — E871 Hypo-osmolality and hyponatremia: Secondary | ICD-10-CM | POA: Diagnosis not present

## 2024-02-27 DIAGNOSIS — H9193 Unspecified hearing loss, bilateral: Secondary | ICD-10-CM | POA: Diagnosis not present

## 2024-02-28 DIAGNOSIS — I493 Ventricular premature depolarization: Secondary | ICD-10-CM | POA: Diagnosis not present

## 2024-02-29 DIAGNOSIS — J90 Pleural effusion, not elsewhere classified: Secondary | ICD-10-CM | POA: Diagnosis not present

## 2024-02-29 DIAGNOSIS — J9601 Acute respiratory failure with hypoxia: Secondary | ICD-10-CM | POA: Diagnosis not present

## 2024-02-29 DIAGNOSIS — Z791 Long term (current) use of non-steroidal anti-inflammatories (NSAID): Secondary | ICD-10-CM | POA: Diagnosis not present

## 2024-02-29 DIAGNOSIS — H9193 Unspecified hearing loss, bilateral: Secondary | ICD-10-CM | POA: Diagnosis not present

## 2024-02-29 DIAGNOSIS — I469 Cardiac arrest, cause unspecified: Secondary | ICD-10-CM | POA: Diagnosis not present

## 2024-02-29 DIAGNOSIS — I2510 Atherosclerotic heart disease of native coronary artery without angina pectoris: Secondary | ICD-10-CM | POA: Diagnosis not present

## 2024-02-29 DIAGNOSIS — G479 Sleep disorder, unspecified: Secondary | ICD-10-CM | POA: Diagnosis not present

## 2024-02-29 DIAGNOSIS — M1711 Unilateral primary osteoarthritis, right knee: Secondary | ICD-10-CM | POA: Diagnosis not present

## 2024-02-29 DIAGNOSIS — E871 Hypo-osmolality and hyponatremia: Secondary | ICD-10-CM | POA: Diagnosis not present

## 2024-02-29 DIAGNOSIS — Z9049 Acquired absence of other specified parts of digestive tract: Secondary | ICD-10-CM | POA: Diagnosis not present

## 2024-02-29 DIAGNOSIS — N179 Acute kidney failure, unspecified: Secondary | ICD-10-CM | POA: Diagnosis not present

## 2024-02-29 DIAGNOSIS — N400 Enlarged prostate without lower urinary tract symptoms: Secondary | ICD-10-CM | POA: Diagnosis not present

## 2024-02-29 DIAGNOSIS — J189 Pneumonia, unspecified organism: Secondary | ICD-10-CM | POA: Diagnosis not present

## 2024-02-29 DIAGNOSIS — Z8673 Personal history of transient ischemic attack (TIA), and cerebral infarction without residual deficits: Secondary | ICD-10-CM | POA: Diagnosis not present

## 2024-02-29 DIAGNOSIS — Z7901 Long term (current) use of anticoagulants: Secondary | ICD-10-CM | POA: Diagnosis not present

## 2024-02-29 DIAGNOSIS — I10 Essential (primary) hypertension: Secondary | ICD-10-CM | POA: Diagnosis not present

## 2024-02-29 DIAGNOSIS — D649 Anemia, unspecified: Secondary | ICD-10-CM | POA: Diagnosis not present

## 2024-03-01 ENCOUNTER — Ambulatory Visit: Payer: Self-pay | Admitting: Physician Assistant

## 2024-03-01 ENCOUNTER — Ambulatory Visit (HOSPITAL_COMMUNITY)
Admission: RE | Admit: 2024-03-01 | Discharge: 2024-03-01 | Disposition: A | Discharge status: Home / Self Care | Source: Ambulatory Visit | Attending: Physician Assistant | Admitting: Physician Assistant

## 2024-03-01 ENCOUNTER — Other Ambulatory Visit: Payer: Self-pay | Admitting: Physician Assistant

## 2024-03-01 DIAGNOSIS — R531 Weakness: Secondary | ICD-10-CM

## 2024-03-01 DIAGNOSIS — A419 Sepsis, unspecified organism: Secondary | ICD-10-CM

## 2024-03-01 DIAGNOSIS — I351 Nonrheumatic aortic (valve) insufficiency: Secondary | ICD-10-CM

## 2024-03-01 DIAGNOSIS — I2699 Other pulmonary embolism without acute cor pulmonale: Secondary | ICD-10-CM

## 2024-03-01 DIAGNOSIS — R5383 Other fatigue: Secondary | ICD-10-CM

## 2024-03-01 DIAGNOSIS — I340 Nonrheumatic mitral (valve) insufficiency: Secondary | ICD-10-CM | POA: Insufficient documentation

## 2024-03-01 DIAGNOSIS — I10 Essential (primary) hypertension: Secondary | ICD-10-CM

## 2024-03-01 DIAGNOSIS — I493 Ventricular premature depolarization: Secondary | ICD-10-CM

## 2024-03-01 LAB — ECHOCARDIOGRAM LIMITED
Area-P 1/2: 2.32 cm2
P 1/2 time: 658 ms
S' Lateral: 3.1 cm

## 2024-03-03 DIAGNOSIS — I493 Ventricular premature depolarization: Secondary | ICD-10-CM | POA: Diagnosis not present

## 2024-03-06 ENCOUNTER — Telehealth: Payer: Self-pay | Admitting: Physician Assistant

## 2024-03-06 DIAGNOSIS — G479 Sleep disorder, unspecified: Secondary | ICD-10-CM | POA: Diagnosis not present

## 2024-03-06 DIAGNOSIS — I2510 Atherosclerotic heart disease of native coronary artery without angina pectoris: Secondary | ICD-10-CM | POA: Diagnosis not present

## 2024-03-06 DIAGNOSIS — J9601 Acute respiratory failure with hypoxia: Secondary | ICD-10-CM | POA: Diagnosis not present

## 2024-03-06 DIAGNOSIS — Z9049 Acquired absence of other specified parts of digestive tract: Secondary | ICD-10-CM | POA: Diagnosis not present

## 2024-03-06 DIAGNOSIS — I10 Essential (primary) hypertension: Secondary | ICD-10-CM | POA: Diagnosis not present

## 2024-03-06 DIAGNOSIS — Z7901 Long term (current) use of anticoagulants: Secondary | ICD-10-CM | POA: Diagnosis not present

## 2024-03-06 DIAGNOSIS — J90 Pleural effusion, not elsewhere classified: Secondary | ICD-10-CM | POA: Diagnosis not present

## 2024-03-06 DIAGNOSIS — M1711 Unilateral primary osteoarthritis, right knee: Secondary | ICD-10-CM | POA: Diagnosis not present

## 2024-03-06 DIAGNOSIS — I2699 Other pulmonary embolism without acute cor pulmonale: Secondary | ICD-10-CM | POA: Diagnosis not present

## 2024-03-06 DIAGNOSIS — Z8673 Personal history of transient ischemic attack (TIA), and cerebral infarction without residual deficits: Secondary | ICD-10-CM | POA: Diagnosis not present

## 2024-03-06 DIAGNOSIS — E871 Hypo-osmolality and hyponatremia: Secondary | ICD-10-CM | POA: Diagnosis not present

## 2024-03-06 DIAGNOSIS — Z791 Long term (current) use of non-steroidal anti-inflammatories (NSAID): Secondary | ICD-10-CM | POA: Diagnosis not present

## 2024-03-06 DIAGNOSIS — D649 Anemia, unspecified: Secondary | ICD-10-CM | POA: Diagnosis not present

## 2024-03-06 DIAGNOSIS — N179 Acute kidney failure, unspecified: Secondary | ICD-10-CM | POA: Diagnosis not present

## 2024-03-06 DIAGNOSIS — J189 Pneumonia, unspecified organism: Secondary | ICD-10-CM | POA: Diagnosis not present

## 2024-03-06 DIAGNOSIS — H9193 Unspecified hearing loss, bilateral: Secondary | ICD-10-CM | POA: Diagnosis not present

## 2024-03-06 DIAGNOSIS — I469 Cardiac arrest, cause unspecified: Secondary | ICD-10-CM | POA: Diagnosis not present

## 2024-03-06 DIAGNOSIS — N400 Enlarged prostate without lower urinary tract symptoms: Secondary | ICD-10-CM | POA: Diagnosis not present

## 2024-03-06 NOTE — Telephone Encounter (Signed)
 Pt's grandson Derrek called with pt returning cma call regarding results and is requesting a callback. Please advise

## 2024-03-06 NOTE — Telephone Encounter (Signed)
 No DPR on file. Cannot return call to [place].   Called patient. He is HOH. Requested his results be mailed.  Printed, mailed echo results.

## 2024-03-08 ENCOUNTER — Ambulatory Visit: Payer: Self-pay | Admitting: Physician Assistant

## 2024-03-12 DIAGNOSIS — H9193 Unspecified hearing loss, bilateral: Secondary | ICD-10-CM | POA: Diagnosis not present

## 2024-03-12 DIAGNOSIS — M1711 Unilateral primary osteoarthritis, right knee: Secondary | ICD-10-CM | POA: Diagnosis not present

## 2024-03-12 DIAGNOSIS — I2699 Other pulmonary embolism without acute cor pulmonale: Secondary | ICD-10-CM | POA: Diagnosis not present

## 2024-03-12 DIAGNOSIS — E871 Hypo-osmolality and hyponatremia: Secondary | ICD-10-CM | POA: Diagnosis not present

## 2024-03-12 DIAGNOSIS — Z9049 Acquired absence of other specified parts of digestive tract: Secondary | ICD-10-CM | POA: Diagnosis not present

## 2024-03-12 DIAGNOSIS — Z7901 Long term (current) use of anticoagulants: Secondary | ICD-10-CM | POA: Diagnosis not present

## 2024-03-12 DIAGNOSIS — J189 Pneumonia, unspecified organism: Secondary | ICD-10-CM | POA: Diagnosis not present

## 2024-03-12 DIAGNOSIS — J9601 Acute respiratory failure with hypoxia: Secondary | ICD-10-CM | POA: Diagnosis not present

## 2024-03-12 DIAGNOSIS — I2510 Atherosclerotic heart disease of native coronary artery without angina pectoris: Secondary | ICD-10-CM | POA: Diagnosis not present

## 2024-03-12 DIAGNOSIS — Z8673 Personal history of transient ischemic attack (TIA), and cerebral infarction without residual deficits: Secondary | ICD-10-CM | POA: Diagnosis not present

## 2024-03-12 DIAGNOSIS — Z791 Long term (current) use of non-steroidal anti-inflammatories (NSAID): Secondary | ICD-10-CM | POA: Diagnosis not present

## 2024-03-12 DIAGNOSIS — D649 Anemia, unspecified: Secondary | ICD-10-CM | POA: Diagnosis not present

## 2024-03-12 DIAGNOSIS — I469 Cardiac arrest, cause unspecified: Secondary | ICD-10-CM | POA: Diagnosis not present

## 2024-03-12 DIAGNOSIS — N179 Acute kidney failure, unspecified: Secondary | ICD-10-CM | POA: Diagnosis not present

## 2024-03-12 DIAGNOSIS — J90 Pleural effusion, not elsewhere classified: Secondary | ICD-10-CM | POA: Diagnosis not present

## 2024-03-12 DIAGNOSIS — I10 Essential (primary) hypertension: Secondary | ICD-10-CM | POA: Diagnosis not present

## 2024-03-12 DIAGNOSIS — N400 Enlarged prostate without lower urinary tract symptoms: Secondary | ICD-10-CM | POA: Diagnosis not present

## 2024-03-12 DIAGNOSIS — G479 Sleep disorder, unspecified: Secondary | ICD-10-CM | POA: Diagnosis not present

## 2024-03-13 DIAGNOSIS — Z961 Presence of intraocular lens: Secondary | ICD-10-CM | POA: Diagnosis not present

## 2024-04-09 DIAGNOSIS — K5900 Constipation, unspecified: Secondary | ICD-10-CM | POA: Diagnosis not present

## 2024-04-09 DIAGNOSIS — Z23 Encounter for immunization: Secondary | ICD-10-CM | POA: Diagnosis not present

## 2024-04-09 DIAGNOSIS — Z86711 Personal history of pulmonary embolism: Secondary | ICD-10-CM | POA: Diagnosis not present

## 2024-04-09 DIAGNOSIS — I10 Essential (primary) hypertension: Secondary | ICD-10-CM | POA: Diagnosis not present

## 2024-05-09 DIAGNOSIS — H04123 Dry eye syndrome of bilateral lacrimal glands: Secondary | ICD-10-CM | POA: Diagnosis not present

## 2024-05-09 DIAGNOSIS — H401232 Low-tension glaucoma, bilateral, moderate stage: Secondary | ICD-10-CM | POA: Diagnosis not present

## 2024-05-31 ENCOUNTER — Emergency Department (HOSPITAL_BASED_OUTPATIENT_CLINIC_OR_DEPARTMENT_OTHER)
Admission: EM | Admit: 2024-05-31 | Discharge: 2024-06-01 | Disposition: A | Discharge status: Home / Self Care | Attending: Emergency Medicine | Admitting: Emergency Medicine

## 2024-05-31 ENCOUNTER — Other Ambulatory Visit: Payer: Self-pay

## 2024-05-31 DIAGNOSIS — Z5321 Procedure and treatment not carried out due to patient leaving prior to being seen by health care provider: Secondary | ICD-10-CM | POA: Diagnosis not present

## 2024-05-31 DIAGNOSIS — R42 Dizziness and giddiness: Secondary | ICD-10-CM | POA: Diagnosis not present

## 2024-05-31 DIAGNOSIS — K5900 Constipation, unspecified: Secondary | ICD-10-CM | POA: Insufficient documentation

## 2024-05-31 LAB — CBC
HCT: 38.8 % — ABNORMAL LOW (ref 39.0–52.0)
Hemoglobin: 13 g/dL (ref 13.0–17.0)
MCH: 32.1 pg (ref 26.0–34.0)
MCHC: 33.5 g/dL (ref 30.0–36.0)
MCV: 95.8 fL (ref 80.0–100.0)
Platelets: 208 K/uL (ref 150–400)
RBC: 4.05 MIL/uL — ABNORMAL LOW (ref 4.22–5.81)
RDW: 12.6 % (ref 11.5–15.5)
WBC: 7.9 K/uL (ref 4.0–10.5)
nRBC: 0 % (ref 0.0–0.2)

## 2024-05-31 LAB — LIPASE, BLOOD: Lipase: 28 U/L (ref 11–51)

## 2024-05-31 LAB — COMPREHENSIVE METABOLIC PANEL WITH GFR
ALT: 15 U/L (ref 0–44)
AST: 31 U/L (ref 15–41)
Albumin: 4.2 g/dL (ref 3.5–5.0)
Alkaline Phosphatase: 55 U/L (ref 38–126)
Anion gap: 10 (ref 5–15)
BUN: 20 mg/dL (ref 8–23)
CO2: 26 mmol/L (ref 22–32)
Calcium: 9.5 mg/dL (ref 8.9–10.3)
Chloride: 101 mmol/L (ref 98–111)
Creatinine, Ser: 0.91 mg/dL (ref 0.61–1.24)
GFR, Estimated: >60 mL/min
Glucose, Bld: 163 mg/dL — ABNORMAL HIGH (ref 70–99)
Potassium: 4.8 mmol/L (ref 3.5–5.1)
Sodium: 137 mmol/L (ref 135–145)
Total Bilirubin: 0.4 mg/dL (ref 0.0–1.2)
Total Protein: 6.6 g/dL (ref 6.5–8.1)

## 2024-05-31 NOTE — ED Triage Notes (Signed)
 Pt reports increased constipation x3 days and felt dizzy when trying to have a BM. Pt recently d/c from hospital x1 month ago for blood clot.
